# Patient Record
Sex: Male | Born: 1935 | Race: White | Hispanic: No | Marital: Married | Smoking: Former smoker
Health system: Southern US, Community
[De-identification: ages and names within clinical notes are randomized; demographics above are authoritative.]

## PROBLEM LIST (undated history)

## (undated) DIAGNOSIS — R1310 Dysphagia, unspecified: Secondary | ICD-10-CM

## (undated) DIAGNOSIS — N400 Enlarged prostate without lower urinary tract symptoms: Secondary | ICD-10-CM

## (undated) DIAGNOSIS — M5030 Other cervical disc degeneration, unspecified cervical region: Secondary | ICD-10-CM

## (undated) DIAGNOSIS — I214 Non-ST elevation (NSTEMI) myocardial infarction: Secondary | ICD-10-CM

## (undated) DIAGNOSIS — I10 Essential (primary) hypertension: Secondary | ICD-10-CM

## (undated) DIAGNOSIS — K5900 Constipation, unspecified: Secondary | ICD-10-CM

## (undated) DIAGNOSIS — F329 Major depressive disorder, single episode, unspecified: Secondary | ICD-10-CM

## (undated) DIAGNOSIS — G6181 Chronic inflammatory demyelinating polyneuritis: Secondary | ICD-10-CM

## (undated) DIAGNOSIS — G629 Polyneuropathy, unspecified: Secondary | ICD-10-CM

## (undated) DIAGNOSIS — R269 Unspecified abnormalities of gait and mobility: Secondary | ICD-10-CM

## (undated) DIAGNOSIS — N390 Urinary tract infection, site not specified: Secondary | ICD-10-CM

## (undated) HISTORY — DX: Urinary tract infection, site not specified: N39.0

## (undated) HISTORY — PX: HERNIA REPAIR: SHX51

---

## 2001-11-13 ENCOUNTER — Other Ambulatory Visit: Admission: RE | Admit: 2001-11-13 | Discharge: 2001-11-13 | Payer: Self-pay | Admitting: Dermatology

## 2001-12-16 ENCOUNTER — Ambulatory Visit (HOSPITAL_COMMUNITY): Admission: RE | Admit: 2001-12-16 | Discharge: 2001-12-16 | Payer: Self-pay | Admitting: Pulmonary Disease

## 2002-07-16 ENCOUNTER — Ambulatory Visit (HOSPITAL_COMMUNITY): Admission: RE | Admit: 2002-07-16 | Discharge: 2002-07-16 | Payer: Self-pay | Admitting: Internal Medicine

## 2002-07-17 ENCOUNTER — Ambulatory Visit (HOSPITAL_COMMUNITY): Admission: RE | Admit: 2002-07-17 | Discharge: 2002-07-17 | Payer: Self-pay | Admitting: Internal Medicine

## 2003-02-26 ENCOUNTER — Ambulatory Visit (HOSPITAL_COMMUNITY): Admission: RE | Admit: 2003-02-26 | Discharge: 2003-02-26 | Payer: Self-pay | Admitting: Pulmonary Disease

## 2004-02-24 ENCOUNTER — Ambulatory Visit (HOSPITAL_COMMUNITY): Admission: RE | Admit: 2004-02-24 | Discharge: 2004-02-24 | Payer: Self-pay | Admitting: Pulmonary Disease

## 2005-08-09 ENCOUNTER — Ambulatory Visit (HOSPITAL_COMMUNITY): Admission: RE | Admit: 2005-08-09 | Discharge: 2005-08-09 | Payer: Self-pay | Admitting: Internal Medicine

## 2005-08-09 ENCOUNTER — Ambulatory Visit: Payer: Self-pay | Admitting: Internal Medicine

## 2006-09-05 ENCOUNTER — Ambulatory Visit (HOSPITAL_COMMUNITY): Admission: RE | Admit: 2006-09-05 | Discharge: 2006-09-05 | Payer: Self-pay | Admitting: Pulmonary Disease

## 2010-01-27 ENCOUNTER — Ambulatory Visit (HOSPITAL_COMMUNITY): Admission: RE | Admit: 2010-01-27 | Discharge: 2010-01-27 | Payer: Self-pay | Admitting: Pulmonary Disease

## 2010-09-21 ENCOUNTER — Encounter (HOSPITAL_BASED_OUTPATIENT_CLINIC_OR_DEPARTMENT_OTHER): Payer: Medicare Other | Admitting: Internal Medicine

## 2010-09-21 ENCOUNTER — Ambulatory Visit (HOSPITAL_COMMUNITY)
Admission: RE | Admit: 2010-09-21 | Discharge: 2010-09-21 | Disposition: A | Discharge status: Home / Self Care | Payer: Medicare Other | Source: Ambulatory Visit | Attending: Internal Medicine | Admitting: Internal Medicine

## 2010-09-21 DIAGNOSIS — K5730 Diverticulosis of large intestine without perforation or abscess without bleeding: Secondary | ICD-10-CM | POA: Insufficient documentation

## 2010-09-21 DIAGNOSIS — Z860100 Personal history of colon polyps, unspecified: Secondary | ICD-10-CM | POA: Insufficient documentation

## 2010-09-21 DIAGNOSIS — Z1211 Encounter for screening for malignant neoplasm of colon: Secondary | ICD-10-CM

## 2010-09-21 DIAGNOSIS — K644 Residual hemorrhoidal skin tags: Secondary | ICD-10-CM | POA: Insufficient documentation

## 2010-09-21 DIAGNOSIS — I10 Essential (primary) hypertension: Secondary | ICD-10-CM | POA: Insufficient documentation

## 2010-09-21 DIAGNOSIS — Z09 Encounter for follow-up examination after completed treatment for conditions other than malignant neoplasm: Secondary | ICD-10-CM | POA: Insufficient documentation

## 2010-09-21 DIAGNOSIS — Z79899 Other long term (current) drug therapy: Secondary | ICD-10-CM | POA: Insufficient documentation

## 2010-10-08 NOTE — Op Note (Signed)
  NAME:  Dylan Nelson, Dylan Nelson               ACCOUNT NO.:  [id number]  MEDICAL RECORD NO.:  [id number]           PATIENT TYPE:  O  LOCATION:  DAYP                          FACILITY:  [facility]  PHYSICIAN:  Lionel December, M.D.    DATE OF BIRTH:  08/26/1933  DATE OF PROCEDURE:  09/21/2010 DATE OF DISCHARGE:                              OPERATIVE REPORT   PROCEDURE:  Colonoscopy.  INDICATIONS:  Dylan Nelson is 77 year old Caucasian male with history of colonic polyps whose last exam was 5 years ago.  He is presently not having any problems.  He is undergoing surveillance colonoscopy.  Procedures were reviewed with the patient and informed consent was obtained.  MEDS FOR CONSCIOUS SEDATION: 1. Demerol 25 mg IV. 2. Versed 4 mg IV.  FINDINGS:  Procedure performed in endoscopy suite.  The patient's vital signs and O2 sat were monitored during the procedure and remained stable.  The patient was placed in left lateral recumbent position and rectal examination performed.  No abnormality noted on external or digital exam.  Pentax videoscope was placed through rectum and advanced under vision into sigmoid colon and beyond.  Preparation was satisfactory.  He has scattered diverticula at sigmoid and descending colon.  Scope was passed into cecum which was identified by appendiceal orifice and ileocecal valve.  Pictures were taken for the record.  There was a small 6-7 mm lipoma at ascending colon which was left alone. Mucosa and rest of the colon was normal.  No polyps or masses were noted.  Rectal mucosa similarly was normal.  Scope was retroflexed to examine anorectal junction and small hemorrhoids noted below the dentate line.  Endoscope was withdrawn.  Withdrawal time was 12 minutes.  The patient tolerated the procedure well.  FINAL DIAGNOSES: 1. Examination performed to cecum. 2. No evidence of recurrent polyps. 3. Few scattered diverticula at sigmoid and descending colon and     external  hemorrhoids.  RECOMMENDATIONS: 1. High-fiber diet.  The patient will resume his usual meds as before. 2. We will not plan to schedule colonoscopy in 5 years, however, he     develops any symptoms pertaining to lower GI tract.  We may change     our plan.     Lionel December, M.D.     NR/MEDQ  D:  09/21/2010  T:  09/22/2010  Job:  [id number]  cc:   Dr. Juanetta Gosling  Electronically Signed by Lionel December M.D. on 10/08/2010 11:47:37 AM

## 2010-11-03 NOTE — Op Note (Signed)
NAME:  Dylan Nelson, Dylan Nelson NO.:  [id number]   MEDICAL RECORD NO.:  [id number]          PATIENT TYPE:  AMB   LOCATION:  DAY                           FACILITY:  [facility]   PHYSICIAN:  Lionel December, M.D.    DATE OF BIRTH:  April 24, 1934   DATE OF PROCEDURE:  08/09/2005  DATE OF DISCHARGE:                                 OPERATIVE REPORT   PROCEDURE:  Colonoscopy with polypectomy.   ENDOSCOPIST:  Lionel December, M.D.   INDICATIONS:  [name] is a 77 year old Caucasian male who is here for  surveillance colonoscopy.  He had his initial colonoscopy in January 2004  with removal of a large polyp from the rectum with high-grade dysplasia.  He  had an adenoma removed from the cecum and another tubulovillous adenoma  removed from his ascending colon.  He is now returning for followup exam.  He is presently free of GI symptoms.   The procedure and risks were reviewed with the patient and informed consent  was obtained.   MEDICINES FOR CONSCIOUS SEDATION:  Demerol 25 mg IV and Versed 4 mg IV.   FINDINGS:  Procedure performed in endoscopy suite.  The patient's vital  signs and O2 saturations were monitored during the procedure and remained  stable.  The patient was placed in the left lateral recumbent position and  rectal examination performed.  No abnormality noted on external or digital  exam.   Olympus videoscope was placed in the rectum and advanced under vision into  the sigmoid colon and beyond.  Preparation was satisfactory  There were a  few small diverticula at sigmoid colon.  The scope was passed into the cecum  which was identified by appendiceal orifice and ileocecal valve.  Pictures  were taken for the record.  There was a  small polyp just above the  ileocecal valve which was ablated by cold biopsy.  There was another 5-6 mm  sessile polyp at the hepatic flexure which was snared, and a third polyp at  the sigmoid colon which was small and simply coagulated using snare  tip.  The mucosa of the rest of the colon was normal. The rectal mucosa, similarly  was normal.   The scope was retroflexed to examine the anorectal junction which was  unremarkable.  Examined anorectal junction and small hemorrhoids were noted  below the dentate line.  The endoscope was straightened and withdrawn.  The  patient tolerated the procedure well.   FINAL DIAGNOSES:  1.  A 5-6 mm polyp snared from hepatic flexure and another one ablated by      cold biopsy by ascending colon.  Another tiny polyp at the sigmoid colon      was coagulated.  2.  A few tiny diverticula at the sigmoid colon and external hemorrhoids.   RECOMMENDATIONS:  Standard instructions given.  I will be contacting the  patient with biopsy results; and we will plan to bring him back 5 years from  now.      Lionel December, M.D.  Electronically Signed     NR/MEDQ  D:  08/09/2005  T:  08/09/2005  Job:  [id number]   cc:   Ramon Dredge L. Juanetta Gosling, M.D.  Fax: [phone]

## 2010-11-03 NOTE — Op Note (Signed)
NAME:  Dylan Nelson, Dylan Nelson                            ACCOUNT NO.:  [id number]   MEDICAL RECORD NO.:  [id number]                   PATIENT TYPE:  AMB   LOCATION:  DAY                                  FACILITY:  [facility]   PHYSICIAN:  Lionel December, M.D.                 DATE OF BIRTH:  06-26-1933   DATE OF PROCEDURE:  07/16/2002  DATE OF DISCHARGE:                                 OPERATIVE REPORT   PROCEDURE:  Total colonoscopy with polypectomy.   ENDOSCOPIST:  Lionel December, M.D.   INDICATIONS:  This patient is a 77 year old Caucasian male with recent onset  of hematochezia.  His family history is negative for colorectal carcinoma.  The procedures and risks were reviewed with the patient and informed consent  was obtained.   PREOPERATIVE MEDICATIONS:  Demerol 25 mg IV and Versed 4 mg IV in divided  doses.   INSTRUMENT:  Olympus video system.   FINDINGS:  Procedure performed in endoscopy suite.  The patient's vital  signs and O2 saturation were monitored during the procedure and remained  stable.  The patient was placed in the left lateral recumbent position and  rectal examination was performed.  No abnormality noted on external or  digital exam.   The scope was placed in the rectum and advanced into the rectosigmoid  junction where he had a large polyp at least 4 cm in maximal diameter.  This  was snared on the way out as described below.  The scope was passed to the  sigmoid colon and beyond.  Preparation was satisfactory.  A few scattered  diverticula were noted at the sigmoid colon.  The scope was passed to the  cecum which was identified by appendiceal orifice and the ileocecal valve.  There was a small polyp to the right of the appendiceal orifice which was  snared and retrieved for histologic examination.  There was another 8-to-9-  mm polyp at the ascending colon which was snared and retrieved for  histologic examination. There was another small polyp in this area which was  simply coagulated using snare tip.  The polyp at the rectosigmoid junction  was on a short stalk.  It was hard to see it because of the size of the  polyp. This polyp was snared piecemeal.  The polypectomy site was clean  without any bleeding.  This polyp was completely excised.  All of the pieces  were taken out for histology.  The rectal mucosa was normal.  The scope was  retroflexed to exam the anorectal junction and small hemorrhoids were noted  below the dentate line.   The endoscope was straightened and withdrawn.  The patient tolerated the  procedure well.   FINAL DIAGNOSES:  1. Large polyp snared from the rectosigmoid junction in piecemeal fashion.     Polypectomy is complete.  2. Three polyps treated at the right colon.  One  was at cecum, another one     in the ascending colon.  Two were snared and one was coagulated.  3. Scattered diverticula at the sigmoid colon.  4. Small external hemorrhoids.   DISCUSSION:  It would appear that his hematochezia was secondary to this  large polyp.   RECOMMENDATIONS:  Standard instructions given.  I will contact the patient  with biopsy results and further recommendations.                                               Lionel December, M.D.    NR/MEDQ  D:  07/16/2002  T:  07/16/2002  Job:  [id number]   cc:   Ramon Dredge L. Juanetta Gosling, M.D.  [address]  [place]  [place] [address]  Fax: [phone]

## 2010-11-03 NOTE — Op Note (Signed)
NAME:  Dylan Nelson, Dylan Nelson                            ACCOUNT NO.:  [id number]   MEDICAL RECORD NO.:  [id number]                   PATIENT TYPE:  AMB   LOCATION:  DAY                                  FACILITY:  [facility]   PHYSICIAN:  Lionel December, M.D.                 DATE OF BIRTH:  [birth year]   DATE OF PROCEDURE:  07/17/2002  DATE OF DISCHARGE:                                 OPERATIVE REPORT   PROCEDURE:  Flexible sigmoidoscopy with injection/coagulation of bleeding  polypectomy site.   INDICATIONS FOR PROCEDURE:  The patient is a 77 year old Caucasian male who  had a colonoscopy yesterday. He had three polyps removed from his right  colon and a very large sessile polyp removed from the rectosigmoid junction.  This was removed piecemeal. The polypectomy was felt to be complete. The  patient had some bleeding last night and again this morning. He was  therefore advised to come back to the hospital. He feels fine, otherwise, he  is not dizzy or light headed. He does not have any abdominal pain. Digital  exam revealed a scant amount of maroonish stool on the gloved finger. I  advised the patient to have therapeutic sigmoidoscopy and he is agreeable.   PREOP MEDICATIONS:  None.   INSTRUMENT:  Olympus video system.   FINDINGS:  Procedure performed in endoscopy suite. The patient was placed in  the left lateral decubitus position and a rectal examination performed.  There was a small amount of fresh maroonish blood. The scope was placed in  the rectum. There was some blood which was suctioned out through the  channel. The polypectomy site was the rectosigmoid junction. It had some  fresh blood or clots on it but there was no active bleeding. Quick exam of  sigmoid colon was normal. The polypectomy site was injected with dilute,  i.e.,  1:10,000 epinephrine. There were three sites which had fresh specks  of blood. One site was pulsating and felt to be a small blood vessel. After  local  injection therapy, these three sites were coagulated using a Gold  probe. These sites were covered in two black eschars, there was no bleeding  noted during or after coagulation. The endoscope was withdrawn. The patient  tolerated the procedure well and he was discharged to home.   FINAL DIAGNOSIS:  Post polypectomy bleed. Bleeding sites treated with  combination of injection and coagulation therapy.    RECOMMENDATIONS:  1. The patient was advised to rest at home for 24 hours.  2. He will continue to abstain from taking ASA for 10 days.  3. I will be contacting the patient next week with the biopsy results which     are still pending.  Lionel December, M.D.    NR/MEDQ  D:  07/17/2002  T:  07/17/2002  Job:  [id number]   cc:   Ramon Dredge L. Juanetta Gosling, M.D.  [address]  [place]  [place] [address]  Fax: [phone]

## 2010-11-28 ENCOUNTER — Ambulatory Visit (INDEPENDENT_AMBULATORY_CARE_PROVIDER_SITE_OTHER): Payer: Medicare Other | Admitting: Urology

## 2010-11-28 DIAGNOSIS — C61 Malignant neoplasm of prostate: Secondary | ICD-10-CM

## 2010-11-28 DIAGNOSIS — N400 Enlarged prostate without lower urinary tract symptoms: Secondary | ICD-10-CM

## 2011-05-12 ENCOUNTER — Emergency Department (HOSPITAL_COMMUNITY): Payer: Medicare Other

## 2011-05-12 ENCOUNTER — Emergency Department (HOSPITAL_COMMUNITY)
Admission: EM | Admit: 2011-05-12 | Discharge: 2011-05-12 | Disposition: A | Discharge status: Home / Self Care | Payer: Medicare Other | Attending: Emergency Medicine | Admitting: Emergency Medicine

## 2011-05-12 DIAGNOSIS — X500XXA Overexertion from strenuous movement or load, initial encounter: Secondary | ICD-10-CM | POA: Insufficient documentation

## 2011-05-12 DIAGNOSIS — G5790 Unspecified mononeuropathy of unspecified lower limb: Secondary | ICD-10-CM | POA: Insufficient documentation

## 2011-05-12 DIAGNOSIS — R1030 Lower abdominal pain, unspecified: Secondary | ICD-10-CM

## 2011-05-12 DIAGNOSIS — R109 Unspecified abdominal pain: Secondary | ICD-10-CM | POA: Insufficient documentation

## 2011-05-12 DIAGNOSIS — I10 Essential (primary) hypertension: Secondary | ICD-10-CM | POA: Insufficient documentation

## 2011-05-12 HISTORY — DX: Polyneuropathy, unspecified: G62.9

## 2011-05-12 HISTORY — DX: Essential (primary) hypertension: I10

## 2011-05-12 HISTORY — DX: Benign prostatic hyperplasia without lower urinary tract symptoms: N40.0

## 2011-05-12 NOTE — ED Provider Notes (Addendum)
History  This chart was scribed for [employer]. Rubin Payor, MD by Bennett Scrape. This patient was seen in room [facility]19/[facility] and the patient's care was started at 7:17PM.  CSN: [id number] Arrival date & time: 05/12/2011  6:39 PM   First MD Initiated Contact with Patient 05/12/11 1910      Chief Complaint  Patient presents with  . Groin Pain    The history is provided by the patient. No language interpreter was used.   Dylan Nelson is a 77 y.o. male who presents to the Emergency Department complaining of one day of constant left inguinal area pain with associated constant numbness and swelling after pt felt something pop while pulling himself up off of the comode with an wall assistance bar. Pt states that he can't rotate his left leg inward or put weight on his left leg. Pt had similar symptoms one week earlier and was diagnosed with inflammatory neuropathy. He was prescribed antibiotics and steroids with mild improvement in symptoms. Pt states that symptoms and rotation have improved since arriving to ED. Pt states that sitting improves his symptoms and that movement aggravates his pain.    Past Medical History  Diagnosis Date  . Hypertension   . Neuropathy   . Prostate enlargement     History reviewed. No pertinent past surgical history.  History reviewed. No pertinent family history.  History  Substance Use Topics  . Smoking status: Not on file  . Smokeless tobacco: Not on file  . Alcohol Use:      Review of Systems A complete 10 system review of systems was obtained and is otherwise negative except as noted in the HPI.   Allergies  Review of patient's allergies indicates not on file.  Home Medications  No current outpatient prescriptions on file.  BP 123/74  Pulse 97  Temp(Src) 97.6 F (36.4 C) (Oral)  Resp 20  Ht 6\' 1"  (1.854 m)  Wt 245 lb (111.131 kg)  BMI 32.32 kg/m2  SpO2 94%  Physical Exam  Nursing note and vitals reviewed. Constitutional: He is  oriented to person, place, and time. He appears well-developed and well-nourished.  HENT:  Head: Normocephalic and atraumatic.  Eyes: EOM are normal. Pupils are equal, round, and reactive to light.  Neck: Neck supple. No tracheal deviation present.  Cardiovascular: Normal rate and regular rhythm.   Pulmonary/Chest: Effort normal and breath sounds normal. No respiratory distress.  Abdominal: Soft. There is no tenderness.  Musculoskeletal:       Mild tenderness on the left lateral pubis, decreased sensation in left calf, decreased strength in the left calf, decreased ROM in left calf which is chronic, mild tenderness with left leg rotation inward  Neurological: He is alert and oriented to person, place, and time.  Skin: Skin is warm and dry.  Psychiatric: He has a normal mood and affect. His behavior is normal.    ED Course  Procedures (including critical care time)  DIAGNOSTIC STUDIES: Oxygen Saturation is 94% on room air, adequate by my interpretation.    COORDINATION OF CARE: 7:22PM-Discussed treatment plan with patient at bedside and patient agreed to plan. 8:11PM-Discussed discharge home with patient and patient agreed to plan.   Labs Reviewed - No data to display Dg Pelvis 1-2 Views  05/12/2011  *RADIOLOGY REPORT*  Clinical Data: Felt pop left groin region after lifting himself.  PELVIS - 1-2 VIEW  Comparison: None.  Findings: Limited by patient's habitus.  No gross osseous abnormality noted.  IMPRESSION: Limited  by patient's habitus.  No gross osseous abnormality noted.  Original Report Authenticated By: Fuller Canada, M.D.     1. Groin pain       MDM  Patient felt a pop in his left hip he was getting off the commode. He is a history of neuropathy in that leg. He states that certain movements that make the pain worse. The pain is improving now. He's not very ambulatory at baseline. He is a wheelchair for the house a motorized scooter for outside. His x-ray does not show  a fracture. His pain is improving. It may have been a tendon problem, less likely I feel is a subluxation resolved. He should followup with his Dr. he is not appear to have a hernia or any testicular tenderness or swelling.      I personally performed the services described in this documentation, which was scribed in my presence. The recorded information has been reviewed and considered.   Juliet Rude. Rubin Payor, MD 05/12/11 2013  Juliet Rude. Rubin Payor, MD 05/12/11 2014

## 2011-05-12 NOTE — ED Notes (Signed)
Pt states he was pulling himself up from the commode and felt something pop in the left groin area

## 2011-05-29 ENCOUNTER — Ambulatory Visit (INDEPENDENT_AMBULATORY_CARE_PROVIDER_SITE_OTHER): Payer: Medicare Other | Admitting: Urology

## 2011-05-29 DIAGNOSIS — N400 Enlarged prostate without lower urinary tract symptoms: Secondary | ICD-10-CM

## 2011-05-29 DIAGNOSIS — C61 Malignant neoplasm of prostate: Secondary | ICD-10-CM

## 2011-12-04 ENCOUNTER — Other Ambulatory Visit: Payer: Self-pay | Admitting: Urology

## 2011-12-04 ENCOUNTER — Ambulatory Visit (HOSPITAL_COMMUNITY)
Admission: RE | Admit: 2011-12-04 | Discharge: 2011-12-04 | Disposition: A | Discharge status: Home / Self Care | Payer: Medicare Other | Source: Ambulatory Visit | Attending: Urology | Admitting: Urology

## 2011-12-04 VITALS — BP 132/68 | Resp 18

## 2011-12-04 DIAGNOSIS — C61 Malignant neoplasm of prostate: Secondary | ICD-10-CM | POA: Insufficient documentation

## 2011-12-04 NOTE — Discharge Instructions (Signed)
Prostate Biopsy  TRUS Biopsy  BEFORE THE TEST    Do not take aspirin. Do not take any medicine that has aspirin in it 7 days before your biopsy.    You may be given a medicine to take on the day of your biopsy.    You may also be given a medicine or treatment to help you go poop (laxative or enema).   AFTER THE TEST   Only take medicine as told by your doctor.    It is normal to have some bleeding from your rectum for the first 5 days.    You may have blood in your pee (urine) or sperm.   Finding out the results of your test  Ask when your test results will be ready. Make sure you get your test results.  GET HELP RIGHT AWAY IF:   You have a temperature by mouth above 102 F (38.9 C), not controlled by medicine.    You have blood in your pee for more than 5 days.    You have a lot of blood in your pee.    You have bleeding from your rectum for more than 5 days or have a lot of blood in your poop (feces).    You have severe pain.   Document Released: 05/23/2009 Document Revised: 05/24/2011 Document Reviewed: 05/23/2009  [employer] Patient Information 2012 [employer].

## 2011-12-04 NOTE — Progress Notes (Signed)
Twelve specimens obtained

## 2011-12-10 ENCOUNTER — Encounter: Payer: Self-pay | Admitting: Urology

## 2012-05-20 ENCOUNTER — Ambulatory Visit (INDEPENDENT_AMBULATORY_CARE_PROVIDER_SITE_OTHER): Payer: Medicare Other | Admitting: Urology

## 2012-05-20 DIAGNOSIS — N400 Enlarged prostate without lower urinary tract symptoms: Secondary | ICD-10-CM

## 2012-05-20 DIAGNOSIS — C61 Malignant neoplasm of prostate: Secondary | ICD-10-CM

## 2012-11-18 ENCOUNTER — Ambulatory Visit (INDEPENDENT_AMBULATORY_CARE_PROVIDER_SITE_OTHER): Payer: Medicare Other | Admitting: Urology

## 2012-11-18 DIAGNOSIS — N400 Enlarged prostate without lower urinary tract symptoms: Secondary | ICD-10-CM

## 2012-11-18 DIAGNOSIS — C61 Malignant neoplasm of prostate: Secondary | ICD-10-CM

## 2013-01-14 ENCOUNTER — Other Ambulatory Visit (HOSPITAL_COMMUNITY): Payer: Self-pay | Admitting: Pulmonary Disease

## 2013-01-14 ENCOUNTER — Ambulatory Visit (HOSPITAL_COMMUNITY)
Admission: RE | Admit: 2013-01-14 | Discharge: 2013-01-14 | Disposition: A | Discharge status: Home / Self Care | Payer: Medicare Other | Source: Ambulatory Visit | Attending: Pulmonary Disease | Admitting: Pulmonary Disease

## 2013-01-14 DIAGNOSIS — R220 Localized swelling, mass and lump, head: Secondary | ICD-10-CM

## 2013-01-14 DIAGNOSIS — Q670 Congenital facial asymmetry: Secondary | ICD-10-CM

## 2013-01-14 DIAGNOSIS — G319 Degenerative disease of nervous system, unspecified: Secondary | ICD-10-CM | POA: Insufficient documentation

## 2013-01-14 DIAGNOSIS — R29898 Other symptoms and signs involving the musculoskeletal system: Secondary | ICD-10-CM | POA: Insufficient documentation

## 2013-05-26 ENCOUNTER — Ambulatory Visit (INDEPENDENT_AMBULATORY_CARE_PROVIDER_SITE_OTHER): Payer: Medicare Other | Admitting: Urology

## 2013-05-26 ENCOUNTER — Encounter (INDEPENDENT_AMBULATORY_CARE_PROVIDER_SITE_OTHER): Payer: Self-pay

## 2013-05-26 DIAGNOSIS — C61 Malignant neoplasm of prostate: Secondary | ICD-10-CM

## 2013-05-26 DIAGNOSIS — N400 Enlarged prostate without lower urinary tract symptoms: Secondary | ICD-10-CM

## 2013-07-13 ENCOUNTER — Other Ambulatory Visit (HOSPITAL_COMMUNITY): Payer: Self-pay | Admitting: Pulmonary Disease

## 2013-07-13 ENCOUNTER — Ambulatory Visit (HOSPITAL_COMMUNITY)
Admission: RE | Admit: 2013-07-13 | Discharge: 2013-07-13 | Disposition: A | Discharge status: Home / Self Care | Payer: Medicare Other | Source: Ambulatory Visit | Attending: Pulmonary Disease | Admitting: Pulmonary Disease

## 2013-07-13 DIAGNOSIS — R209 Unspecified disturbances of skin sensation: Secondary | ICD-10-CM | POA: Insufficient documentation

## 2013-07-13 DIAGNOSIS — R29898 Other symptoms and signs involving the musculoskeletal system: Secondary | ICD-10-CM

## 2013-07-13 DIAGNOSIS — M47812 Spondylosis without myelopathy or radiculopathy, cervical region: Secondary | ICD-10-CM | POA: Insufficient documentation

## 2013-09-02 ENCOUNTER — Other Ambulatory Visit: Payer: Self-pay | Admitting: Neurology

## 2013-09-02 DIAGNOSIS — G039 Meningitis, unspecified: Secondary | ICD-10-CM

## 2013-09-07 ENCOUNTER — Ambulatory Visit (HOSPITAL_COMMUNITY)
Admission: RE | Admit: 2013-09-07 | Discharge: 2013-09-07 | Disposition: A | Discharge status: Home / Self Care | Payer: Medicare Other | Source: Ambulatory Visit | Attending: Neurology | Admitting: Neurology

## 2013-09-07 DIAGNOSIS — M6281 Muscle weakness (generalized): Secondary | ICD-10-CM | POA: Insufficient documentation

## 2013-09-07 DIAGNOSIS — G5790 Unspecified mononeuropathy of unspecified lower limb: Secondary | ICD-10-CM | POA: Insufficient documentation

## 2013-09-07 DIAGNOSIS — IMO0001 Reserved for inherently not codable concepts without codable children: Secondary | ICD-10-CM | POA: Insufficient documentation

## 2013-09-07 DIAGNOSIS — R262 Difficulty in walking, not elsewhere classified: Secondary | ICD-10-CM | POA: Insufficient documentation

## 2013-09-07 DIAGNOSIS — R269 Unspecified abnormalities of gait and mobility: Secondary | ICD-10-CM | POA: Insufficient documentation

## 2013-09-07 NOTE — Evaluation (Signed)
Physical Therapy Evaluation  Patient Details  Name: Dylan Nelson MRN: [id number]  Date of Birth: September 18, 1933  Today's Date: 09/07/2013 Time: 1345-1430 PT Time Calculation (min): 45 min   Charges PT evaluation            Visit#: 1 of 8  Re-eval: 10/07/13    Authorization: [employer]/ [employer]     Authorization Time Period:    Authorization Visit#: 1 of 8   Past Medical History:  Past Medical History  Diagnosis Date  . Hypertension   . Neuropathy   . Prostate enlargement    Past Surgical History: No past surgical history on file.  Subjective Symptoms/Limitations Symptoms:  increased amount of weakness over past 3-4 months, states years ago 10+  leg weakness started, presents using scooter, states uses  electric wheel chair in house, has not walked inside home for  10 years  Pertinent History: 79 year old male , lives with wife , house adapted, sliding transfers without sliding board , unknown cause of msucle weakness  Limitations: Standing;Walking;House hold activities How long can you walk comfortably?: unable to walk or stand x 10 + years  Patient Stated Goals: improve strength, transfer better  Pain Assessment Currently in Pain?: No/denies  Precautions/Restrictions  Precautions Precaution Comments: numbness B feet   Balance Screening Balance Screen Has the patient fallen in the past 6 months: No (recalls incidents of falls with sliding body from chair to chair ) Has the patient had a decrease in activity level because of a fear of falling? : No Is the patient reluctant to leave their home because of a fear of falling? : No  Prior Functioning  Prior Function Level of Independence: Needs assistance with ADLs;Independent with homemaking with wheelchair Driving: Yes Vocation: Retired  RLE AROM (degrees) Right Ankle Dorsiflexion: 0 Right Ankle Plantar Flexion: 0 RLE Strength Right Hip Flexion: 3/5 Right Hip ABduction: 2-/5 Right Hip ADduction: 3/5 Right Knee Flexion:  3/5 Right Knee Extension: 3/5 Right Ankle Dorsiflexion: 0/5 Right Ankle Plantar Flexion: 3/5 RLE Tone RLE Tone: Hypotonic LLE AROM (degrees) Left Ankle Dorsiflexion: 0 Left Ankle Plantar Flexion: 0 LLE Strength Left Hip Flexion: 3/5 Left Hip ABduction: 2-/5 Left Knee Flexion: 3/5 Left Knee Extension: 3/5 Left Ankle Dorsiflexion: 0/5 Left Ankle Plantar Flexion: 0/5 LLE Tone LLE Tone: Hypotonic  Exercise/Treatments Mobility/Balance  Bed Mobility Bed Mobility: Sit to Supine Sit to Supine: 7: Independent Transfers Transfers: Lateral/Scoot Transfers Lateral/Scoot Transfers: 6: Modified independent (Device/Increase time) Instructed in slide board transfer for treatment table to scooter chair  Ambulation/Gait Ambulation/Gait: No Sit to stand mod A x 2, forward flexed posture at hips, 30 sec tolerance  Wheelchair Mobility Wheelchair Mobility: Yes (motorized scooter )       Physical Therapy Assessment and Plan PT Assessment and Plan Clinical Impression Statement: 80 year old pleasant male present to PT for evaluation and treatment with med dx of peripheral neuropathy and ataxic gait. Script alos contains orders for orthotics. He states muscle weakness present and worsening since 2001, causing inability to ambulate and stand insdoe his home over past 10 years. He does utilize a Estate manager/land agent outsdie the home and elecctric wheelchair inside the home.  Pt will benefit from skilled therapeutic intervention in order to improve on the following deficits: Decreased mobility;Decreased strength;Decreased activity tolerance Rehab Potential: Fair Clinical Impairments Affecting Rehab Potential: motivated to improve tranfsers using sliding board, rehab potential foar to good to reach goals, poor prognosis for standing tolerance   PT Frequency: Min 2X/week  PT Duration: 4 weeks PT Treatment/Interventions: DME instruction;Functional mobility training;Therapeutic activities;Therapeutic  exercise;Patient/family education PT Plan: re educate on safe use of sliding board, sitting and table based leg strengthening exercises. trials of standing as toerlated per knee pain     Goals Home Exercise Program Pt/caregiver will Perform Home Exercise Program: For increased strengthening PT Goal: Perform Home Exercise Program - Progress: Goal set today PT Short Term Goals Time to Complete Short Term Goals: 4 weeks PT Short Term Goal 1: safely and independently use transfer sliding board in clinic from scooter chair to treatment table to allow for greater ease with transfers at home from scooter to wheelchair and other surfaces  PT Short Term Goal 2: patient MMT seated hip flexion 4/5 for transfers and leg management in bed  PT Short Term Goal 3: patient  to dispaly weight bearing and pushing through legs during sliding tranfers for improved safety    Problem List Patient Active Problem List   Diagnosis Date Noted  . Muscle weakness (generalized) 09/07/2013  . Unable to walk 09/07/2013    PT - End of Session Equipment Utilized During Treatment: Gait belt;Other (comment) (slide board ) General Behavior During Therapy: WFL for tasks assessed/performed PT Plan of Care Consulted and Agree with Plan of Care: Patient  GP Functional Assessment Tool Used: FOTO  Functional Limitation: Changing and maintaining body position Changing and Maintaining Body Position Current Status (C1448): At least 60 percent but less than 80 percent impaired, limited or restricted Changing and Maintaining Body Position Goal Status (J8563): At least 40 percent but less than 60 percent impaired, limited or restricted  Dylan Nelson 09/07/2013, 5:07 PM  Physician Documentation Your signature is required to indicate approval of the treatment plan as stated above.  Please sign and either send electronically or make a copy of this report for your files and return this physician signed original.   Please mark one  1.__approve of plan  2. ___approve of plan with the following conditions.   ______________________________                                                          _____________________ Physician Signature                                                                                                             Date

## 2013-09-10 ENCOUNTER — Ambulatory Visit (HOSPITAL_COMMUNITY)
Admission: RE | Admit: 2013-09-10 | Discharge: 2013-09-10 | Disposition: A | Discharge status: Home / Self Care | Payer: Medicare Other | Source: Ambulatory Visit | Attending: Neurology | Admitting: Neurology

## 2013-09-10 NOTE — Progress Notes (Signed)
Physical Therapy Treatment Patient Details  Name: Dylan Nelson MRN: [id number] Date of Birth: September 06, 1933  Today's Date: 09/10/2013 Time: 1345-1430 PT Time Calculation (min): 45 min Charges: Therex x 40  Visit#: 2 of 8  Re-eval: 10/07/13  Authorization: Medicare/ BCBS   Authorization Visit#: 2 of 8   Subjective: Symptoms/Limitations Symptoms: "When I try to stand my feet slide." Pain Assessment Currently in Pain?: No/denies  Exercise/Treatments Standing Other Standing Lumbar Exercises: Sit to stadn from elevated surface x 3 with 2 assist Supine Ab Set: 10 reps Clam: 10 reps Bent Knee Raise: 10 reps Bridge: 10 reps Straight Leg Raise: 5 reps  Physical Therapy Assessment and Plan PT Assessment and Plan Clinical Impression Statement: Therapist facilitated therapeutic exercises to improve LE and core strength to in turn improve functional strength/independence. Pt able to stand from elevated surface maximum of 20" with walker and 2 assist. Pt has no ankle muscle activation and feet tend to slide or pronate when standing. Pt may benefit from AFO to improve ankle alignment in standing. Pt presents with flexed trunk when standing and bears most of his weight through his right upper extremity. Pt requires multimodal cueing to improve LE control and coordination.  Pt will benefit from skilled therapeutic intervention in order to improve on the following deficits: Decreased mobility;Decreased strength;Decreased activity tolerance Rehab Potential: Fair PT Treatment/Interventions: DME instruction;Functional mobility training;Therapeutic activities;Therapeutic exercise;Patient/family education PT Plan: Continue to progress safe use of sliding board, sitting and table based leg strengthening exercises. Assess need for AFO for foot alignment in standing.    Problem List Patient Active Problem List   Diagnosis Date Noted  . Muscle weakness (generalized) 09/07/2013  . Unable to walk  09/07/2013    PT - End of Session Equipment Utilized During Treatment: Gait belt;Other (comment) (slide board ) Activity Tolerance: Patient tolerated treatment well General Behavior During Therapy: [facility] for tasks assessed/performed  GP Functional Assessment Tool Used: Bedelia Person, PTA  09/10/2013, 4:46 PM

## 2013-09-14 ENCOUNTER — Ambulatory Visit (HOSPITAL_COMMUNITY)
Admission: RE | Admit: 2013-09-14 | Discharge: 2013-09-14 | Disposition: A | Discharge status: Home / Self Care | Payer: Medicare Other | Source: Ambulatory Visit | Attending: Neurology | Admitting: Neurology

## 2013-09-14 DIAGNOSIS — M6281 Muscle weakness (generalized): Secondary | ICD-10-CM

## 2013-09-14 DIAGNOSIS — R262 Difficulty in walking, not elsewhere classified: Secondary | ICD-10-CM

## 2013-09-14 NOTE — Progress Notes (Signed)
Physical Therapy Treatment Patient Details  Name: Dylan Nelson MRN: [id number] Date of Birth: Sep 05, 1933  Today's Date: 09/14/2013 Time: 1315-1405 PT Time Calculation (min): 50 min 1315 - 1400 TE  Visit#: 3 of 8  Re-eval: 10/07/13 Assessment Diagnosis: ataxic gait, peripheral neuropathy  Next MD Visit: Phillips Odor MD  Prior Therapy: no   Authorization: Medicare/ BCBS   Authorization Time Period:    Authorization Visit#: 3 of 8   Subjective: Symptoms/Limitations Symptoms: no new complaints, no pain  Pertinent History: 80 year old male , lives with wife , house adapted, sliding transfers without sliding board , unknown cause of msucle weakness   Precautions/Restrictions     Exercise/Treatments Mobility/Balance        Stretches Lower Trunk Rotation: 5 reps;Limitations Lower Trunk Rotation Limitations: therapist resistance  Aerobic   Machines for Strengthening   Standing   Seated Other Seated Lumbar Exercises: seated LAQ 10x, seated hip flexion 10x, seated forward and side weight shifting Other Seated Lumbar Exercises: wheelchair leg mobility reverse leg pushes 60 feet, forward leg pulls/hamstrings 35 feet  Supine Ab Set: 10 reps Clam: 10 reps Bent Knee Raise: 10 reps Bridge: 10 reps Straight Leg Raise: 5 reps Other Supine Lumbar Exercises: seated ball squeeze in hoooklying 10x 5 sec holds  Other Supine Lumbar Exercises: hs curls with heel on green ball 15x  Sidelying   Prone    Quadruped       Physical Therapy Assessment and Plan PT Assessment and Plan Clinical Impression Statement: therapsit faciliated exercises for core and leg strength, verbal and tactile cues for weight shifting to use transfer board , mod assist from wheelchair to tratemnt table using slide board, min assist to supervision treatment table back to transport wheelchair, improved core strength would help with weight shifting for improved placement of sliding board  PT Plan: Continue  to progress safe use of sliding board, sitting and table based leg strengthening exercises. PTA contacted AFO and awaiting for return call     Goals PT Short Term Goals Time to Complete Short Term Goals: 4 weeks PT Short Term Goal 1: safely and independently use transfer sliding board in clinic from scooter chair to treatment table to allow for greater ease with transfers at home from scooter to wheelchair and other surfaces  PT Short Term Goal 1 - Progress: Progressing toward goal PT Short Term Goal 2: patient MMT seated hip flexion 4/5 for transfers and leg management in bed  PT Short Term Goal 2 - Progress: Progressing toward goal PT Short Term Goal 3: patient  to dispaly weight bearing and pushing through legs during sliding tranfers for improved safety   PT Short Term Goal 3 - Progress: Progressing toward goal  Problem List Patient Active Problem List   Diagnosis Date Noted  . Muscle weakness (generalized) 09/07/2013  . Unable to walk 09/07/2013       GP    Arrian Manson 09/14/2013, 2:36 PM

## 2013-09-16 ENCOUNTER — Encounter (HOSPITAL_COMMUNITY): Payer: Self-pay

## 2013-09-16 ENCOUNTER — Ambulatory Visit (HOSPITAL_COMMUNITY)
Admission: RE | Admit: 2013-09-16 | Discharge: 2013-09-16 | Disposition: A | Discharge status: Home / Self Care | Payer: Medicare Other | Source: Ambulatory Visit | Attending: Neurology | Admitting: Neurology

## 2013-09-16 ENCOUNTER — Inpatient Hospital Stay (HOSPITAL_COMMUNITY)
Admission: RE | Admit: 2013-09-16 | Discharge: 2013-09-16 | Disposition: A | Discharge status: Home / Self Care | Payer: Medicare Other | Source: Ambulatory Visit

## 2013-09-16 VITALS — BP 148/91 | HR 82 | Temp 97.1°F | Resp 18

## 2013-09-16 DIAGNOSIS — G039 Meningitis, unspecified: Secondary | ICD-10-CM | POA: Insufficient documentation

## 2013-09-16 LAB — CSF CELL COUNT WITH DIFFERENTIAL
RBC Count, CSF: 171 /mm3 — ABNORMAL HIGH
TUBE #: 4
WBC, CSF: 1 /mm3 (ref 0–5)

## 2013-09-16 LAB — CRYPTOCOCCAL ANTIGEN, CSF: CRYPTO AG: NEGATIVE

## 2013-09-16 LAB — PROTEIN, CSF: TOTAL PROTEIN, CSF: 37 mg/dL (ref 15–45)

## 2013-09-16 LAB — GLUCOSE, CSF: GLUCOSE CSF: 65 mg/dL (ref 43–76)

## 2013-09-16 NOTE — Progress Notes (Signed)
Pt received to Phase 2 at 1130.  Pt remained flat for 4 hours.  Taking po fluids well.  Denies any headaches.  Discharged from Phase 2 at 1530. Discharge instructions given and patient and family verbalized understanding.

## 2013-09-16 NOTE — Discharge Instructions (Signed)
Spinal Headache, Conservative Treatment Sometimes following a spinal tap (lumbar puncture) or an epidural, there may be CSF (cerebrospinal fluid) leakage through a hole in the dura. The dura is one of the protective membranes covering the brain and spinal cord. This leakage produces low CSF pressure. Pressure on the pain sensitive structures in the brain and relaxation (dilating) of the vessels in the head when the patient is upright is thought to cause headaches. The headache usually lasts until the hole heals and CSF pressure is restored. This can last a few days and rarely more than one week. THERAPEUTIC ALTERNATIVES TO EPIDURAL BLOOD PATCHING INCLUDE:  Bedrest: The symptoms of PDPH are lessened by lying down on your back. Lying on your back for a period of time (such as 24 hours) after a dural puncture has no preventative effect. It only delays the start of the PDPH.  Hydration: Normal taking in fluids (hydration) should be maintained. Extra hydration does not alleviate the headache, but dehydration may make symptoms worse.  Analgesics: Narcotic analgesics and, in some instances, non-steroidal anti-inflammatory agents are often given for treatment of the headache pain.  Caffeine: Caffeine intake is a therapy to help shrink the cerebral vessels. Patients should have caffeine early in the day so that he/she can sleep at night. 500 mg of Caffeine sodium benzoate can be given in the vein (intravenously). It can be given once two hours later if the first dose does not have the desired effect. Caffeinated beverages (colas, tea, coffee) can be somewhat effective also.  Epidural Saline Injection: Large-volume shots (boluses) or infusion of epidural normal saline can help to quickly and temporarily increase the epidural pressure. The infusions slow the speed at which CSF leaks through the dural hole. This may speed the natural healing process. Although epidural saline can be a useful technique, epidural blood  patches often have a higher success rate. SEEK IMMEDIATE MEDICAL ATTENTION IF:   You do not get relief from the medications given to you or your pain becomes severe.  You have an unexplained oral temperature above 102 F (38.9 C), or as your caregiver suggests.  You have a stiff neck.  You lose bowel or bladder control.  You develop severe symptoms different from your first symptoms.  You have trouble walking. Document Released: 11/24/2001 Document Revised: 08/27/2011 Document Reviewed: 12/25/2012 [facility] Patient Information 2014 [place], [place]. Lumbar Puncture A lumbar puncture, or spinal tap, is a procedure in which a small amount of the fluid that surrounds the brain and spinal cord is removed and examined. The fluid is called the cerebrospinal fluid. This procedure may be done to:   Help diagnose various problems, such as meningitis, encephalitis, multiple sclerosis, and AIDS.   Remove fluid and relieve pressure that occurs with certain types of headaches.   Look for bleeding within the brain and spinal cord areas (central nervous system).   Place medicine into the spinal fluid.  LET [facility] CARE PROVIDER KNOW ABOUT:  Any allergies you have.  All medicines you are taking, including vitamins, herbs, eye drops, creams, and over-the-counter medicines.  Previous problems you or members of your family have had with the use of anesthetics.  Any blood disorders you have.  Previous surgeries you have had.  Medical conditions you have. RISKS AND COMPLICATIONS Generally, this is a safe procedure. However, as with any procedure, complications can occur. Possible complications include:   Spinal headache. This is a severe headache that occurs when there is a leak of spinal fluid.  A spinal headache causes discomfort but is not dangerous. If it persists, another procedure may be done to treat the headache.  Bleeding. This most often occurs in people with bleeding  disorders. These are disorders in which the blood does not clot normally.   Infection at the insertion site that can spread to the bone or spinal fluid.  Formation of a spinal cord tumor (rare).  Brain herniation or movement of the brain into the spinal cord (rare).  Inability to move (extremely rare). BEFORE THE PROCEDURE  You may have blood tests done. These tests can help tell how well your kidneys and liver are working. They can also show how well your blood clots.   If you take blood thinners (anticoagulant medicine), ask your health care provider if and when you should stop taking them.   Your health care provider may order a CT scan of your brain.  Make arrangements for someone to drive you home after the procedure.  PROCEDURE  You will be positioned so that the spaces between the bones of the spine (vertebrae) are as wide as possible. This will make it easier to pass the needle into the spinal canal.  Depending on your age and size, you may lie on your side, curled up with your knees under your chin. Or, you may sit with your head resting on a pillow that is placed at waist level.  The skin covering the lower back (or lumbar region) will be cleaned.   The skin may be numbed with medicine.  You may be given pain medicine or a medicine to help you relax (sedative).  A small needle will be inserted in the skin until it enters the space that contains the spinal fluid. The needle will not enter the spinal cord.   The spinal fluid will be collected into tubes.   The needle will be withdrawn, and a bandage will be placed on the site.  AFTER THE PROCEDURE  You will remain lying down for 1 hour or for as long as your health care provider suggests.   The spinal fluid will be sent to a laboratory to be examined. The results of the examination may be available before you go home.  A test, called a culture, may be taken of the spinal fluid if your health care  provider thinks you have an infection. If cultures were taken for exam, the results will usually be available in a couple of days.  Document Released: 06/01/2000 Document Revised: 03/25/2013 Document Reviewed: 02/09/2013 [facility] Patient Information 2014 [place].

## 2013-09-17 ENCOUNTER — Telehealth (HOSPITAL_COMMUNITY): Payer: Self-pay

## 2013-09-17 ENCOUNTER — Ambulatory Visit (HOSPITAL_COMMUNITY): Payer: Medicare Other | Admitting: Physical Therapy

## 2013-09-18 LAB — VDRL, CSF: VDRL Quant, CSF: NONREACTIVE

## 2013-09-21 ENCOUNTER — Ambulatory Visit (HOSPITAL_COMMUNITY)
Admission: RE | Admit: 2013-09-21 | Discharge: 2013-09-21 | Disposition: A | Discharge status: Home / Self Care | Payer: Medicare Other | Source: Ambulatory Visit | Attending: Pulmonary Disease | Admitting: Pulmonary Disease

## 2013-09-21 DIAGNOSIS — IMO0001 Reserved for inherently not codable concepts without codable children: Secondary | ICD-10-CM | POA: Insufficient documentation

## 2013-09-21 DIAGNOSIS — M6281 Muscle weakness (generalized): Secondary | ICD-10-CM | POA: Insufficient documentation

## 2013-09-21 DIAGNOSIS — R269 Unspecified abnormalities of gait and mobility: Secondary | ICD-10-CM | POA: Insufficient documentation

## 2013-09-21 DIAGNOSIS — G5790 Unspecified mononeuropathy of unspecified lower limb: Secondary | ICD-10-CM | POA: Insufficient documentation

## 2013-09-21 DIAGNOSIS — R262 Difficulty in walking, not elsewhere classified: Secondary | ICD-10-CM

## 2013-09-21 NOTE — Progress Notes (Signed)
Physical Therapy Treatment Patient Details  Name: Dylan Nelson MRN: [id number] Date of Birth: 03/25/1934  Today's Date: 09/21/2013 Time: 1345-1430 PT Time Calculation (min): 45 min Charges : 64  TE  Visit#: 4 of 8  Re-eval: 10/07/13 Assessment Diagnosis: ataxic gait, peripheral neuropathy  Next MD Visit: Phillips Odor MD   10/02/13  Prior Therapy: no   Authorization: Medicare/ BCBS   Authorization Time Period:    Authorization Visit#: 4 of 8   Subjective: Symptoms/Limitations Symptoms: no new complaints, no pain , did okay with lumbar puncture procedure the other day  Pertinent History: 80 year old male , lives with wife , house adapted, sliding transfers without sliding board , unknown cause of msucle weakness  Patient Stated Goals: improve strength, tranfser beter   Precautions/Restrictions     Exercise/Treatments Mobility/Balance        Stretches Lower Trunk Rotation: 5 reps;Limitations Lower Trunk Rotation Limitations: therapist resistance  Aerobic   Machines for Strengthening   Standing   Seated Other Seated Lumbar Exercises: seated LAQ 10x 3# with 5 sec holds, seated hip flexion 15x, seated forward  weight shifiting with clasped arm reach 15x  Other Seated Lumbar Exercises: wheelchair hamstring curls B to propel chair 50 feet across clinic floor  Supine Ab Set: 10 reps Clam: 10 reps;Limitations Clam Limitations: green t band  Bent Knee Raise: 10 reps Straight Leg Raise: 10 reps Other Supine Lumbar Exercises: seated ball squeeze in hoooklying 10x 5 sec holds  Other Supine Lumbar Exercises: hs curls with heel on green ball 15x then therapist resistaed lep extension      Physical Therapy Assessment and Plan PT Assessment and Plan Clinical Impression Statement: improved weight shifting forward and lateral to use sliding board and prepare fopr sliding board tranfsers. poor eccentric hip flexor and quadriceps noted during seated and supine exercises. mod  asssit transfer wheelchair to table, min assist to CGA treatment table back to chair, consult with orthotist next visit during therapy session , patient referenced having polio as a kid , so there could be post polio weakness , patients  ankle ROM DF limited B with no active foot or ankle movements  PT Plan: Continue to progress safe use of sliding board, sitting and table based leg strengthening exercises. Progressing home program     Goals PT Short Term Goals Time to Complete Short Term Goals: 4 weeks PT Short Term Goal 1: safely and independently use transfer sliding board in clinic from scooter chair to treatment table to allow for greater ease with transfers at home from scooter to wheelchair and other surfaces  PT Short Term Goal 1 - Progress: Progressing toward goal PT Short Term Goal 2: patient MMT seated hip flexion 4/5 for transfers and leg management in bed  PT Short Term Goal 2 - Progress: Progressing toward goal PT Short Term Goal 3: patient  to dispaly weight bearing and pushing through legs during sliding tranfers for improved safety   PT Short Term Goal 3 - Progress: Progressing toward goal  Problem List Patient Active Problem List   Diagnosis Date Noted  . Muscle weakness (generalized) 09/07/2013  . Unable to walk 09/07/2013    PT - End of Session Equipment Utilized During Treatment: Gait belt PT Plan of Care Consulted and Agree with Plan of Care: Patient  GP    Jaxston Chohan 09/21/2013, 3:48 PM

## 2013-09-24 ENCOUNTER — Ambulatory Visit (HOSPITAL_COMMUNITY)
Admission: RE | Admit: 2013-09-24 | Discharge: 2013-09-24 | Disposition: A | Discharge status: Home / Self Care | Payer: Medicare Other | Source: Ambulatory Visit | Attending: Pulmonary Disease | Admitting: Pulmonary Disease

## 2013-09-24 DIAGNOSIS — M6281 Muscle weakness (generalized): Secondary | ICD-10-CM

## 2013-09-24 DIAGNOSIS — R262 Difficulty in walking, not elsewhere classified: Secondary | ICD-10-CM

## 2013-09-24 NOTE — Progress Notes (Addendum)
Physical Therapy Treatment Patient Details  Name: Dylan Nelson MRN: [id number] Date of Birth: 1934-04-06  Today's Date: 09/24/2013 Time: 1345-1430 PT Time Calculation (min): 45 min Charges: Ther Ex [id number], Ther act [id number] and 1425-1430  Visit#: 5 of 8  Re-eval: 10/07/13 Assessment Diagnosis: ataxic gait, peripheral neuropathy  Next MD Visit: Phillips Odor MD   10/02/13  Prior Therapy: no   Authorization: Medicare/ BCBS   Authorization Visit#: 5 of 8   Subjective: Symptoms/Limitations Symptoms: Patient states no c/o pain, Patient states he feels he is learning how to transfer better and is gettingin and out of bed better at home; no change in symptoms.   Exercise/Treatments Supine Ab Set: 10 reps Clam: 10 reps;Limitations Clam Limitations: green t band  Heel Slides: 10 reps Bent Knee Raise: 10 reps Straight Leg Raise: 10 reps Other Supine Lumbar Exercises: seated ball squeeze in hoooklying 10x 5 sec holds  Other Supine Lumbar Exercises: hs curls with heel on green ball 15x then therapist resistaed lep extension     Physical Therapy Assessment and Plan PT Assessment and Plan Clinical Impression Statement: improved weight shifting forward and lateral to use sliding board and prepare fopr sliding board transfers. poor eccentric hip flexor and quadriceps noted during seated and supine exercises. mod asssit transfer wheelchair to table, min assist to CGA for table back to chair transfer, consulted with orthotist during therapy session , patient referenced having polio as a kid , so there could be post polio weakness, patients ankle ROM DF limited B with no active foot or ankle movements. Patient stated significant testicular swelling he had mentioned to  MD who stated it was due to LE swelling, though it is this therapists opinion that it should be further investigates to rule out other possible pathologies. Orthotist attended session to determine potential orthoic prescription  to improve ankle and knee stability for standing transfers. Patient was measured for AFO prescription.     Pt will benefit from skilled therapeutic intervention in order to improve on the following deficits: Decreased mobility;Decreased strength;Decreased activity tolerance Rehab Potential: Fair Clinical Impairments Affecting Rehab Potential: motivated to improve tranfsers using sliding board, rehab potential foar to good to reach goals, poor prognosis for standing tolerance   PT Treatment/Interventions: DME instruction;Functional mobility training;Therapeutic activities;Therapeutic exercise;Patient/family education PT Plan: Continue to progress safe use of sliding board for transfers to and from wheel chair with cuing to focus on forward weight shifting. Continue sitting and table based leg strengthening exercises. Progressing home program. Determine if body weight support treadmill use is appropriate.     Goals PT Short Term Goals PT Short Term Goal 1 - Progress: Progressing toward goal PT Short Term Goal 2 - Progress: Progressing toward goal PT Short Term Goal 3 - Progress: Progressing toward goal  Problem List Patient Active Problem List   Diagnosis Date Noted  . Muscle weakness (generalized) 09/07/2013  . Unable to walk 09/07/2013    PT - End of Session Activity Tolerance: Patient tolerated treatment well General Behavior During Therapy: [facility] for tasks assessed/performed PT Plan of Care Consulted and Agree with Plan of Care: Patient  GP    Suzette Battiest Brissa Asante PT DPT 09/24/2013, 2:30 PM

## 2013-09-28 ENCOUNTER — Ambulatory Visit (HOSPITAL_COMMUNITY)
Admission: RE | Admit: 2013-09-28 | Discharge: 2013-09-28 | Disposition: A | Discharge status: Home / Self Care | Payer: Medicare Other | Source: Ambulatory Visit | Attending: Neurology | Admitting: Neurology

## 2013-09-28 DIAGNOSIS — R262 Difficulty in walking, not elsewhere classified: Secondary | ICD-10-CM

## 2013-09-28 DIAGNOSIS — M6281 Muscle weakness (generalized): Secondary | ICD-10-CM

## 2013-09-28 NOTE — Progress Notes (Signed)
Physical Therapy Treatment Patient Details  Name: Braian Tijerina MRN: [id number] Date of Birth: Feb 27, 1934  Today's Date: 09/28/2013 Time: 1350-1430 PT Time Calculation (min): 40 min Charge : TA: 1350-1400, TE 4098-1191  Visit#: 6 of 8  Re-eval: 10/07/13 Assessment Diagnosis: ataxic gait, peripheral neuropathy  Next MD Visit: Phillips Odor MD   10/02/13  Prior Therapy: no   Authorization: Medicare/ BCBS   Authorization Time Period:    Authorization Visit#: 6 of 8   Subjective: Symptoms/Limitations Symptoms: No pain.  Reports small progressing with therapy so far.  Pain Assessment Currently in Pain?: No/denies  Objecrtive  Exercise/Treatments Mobility/Balance  Transfers Transfers: Lateral/Scoot Transfers Lateral/Scoot Transfers: 6: Modified independent (Device/Increase time)   sliding board... Min-mod assistance  Seated Long Arc Quad on Chair: Both;10 reps;Weights LAQ on Chair Weights (lbs): 3 Other Seated Lumbar Exercises: seated LAQ 10x 3# with 5 sec holds, seated hip flexion 15x, seated forward  weight shifiting with clasped arm reach 15x ; chair push ups 5" holds Other Seated Lumbar Exercises: wheelchair hamstring curls B to propel chair 50 feet across clinic floor  Supine Clam: 10 reps;Limitations Clam Limitations: green t band  Bridge: 10 reps;Limitations Straight Leg Raise: 10 reps Other Supine Lumbar Exercises: adduction in hooklying position 10x Other Supine Lumbar Exercises: hs curls with heel on green ball 15x then therapist resistaed lep extension     Physical Therapy Assessment and Plan PT Assessment and Plan Clinical Impression Statement: Continued session focus on improving functional independence with transfers, pt able to verbalize appropriate technique with aide of sliding board from wheelchair to mat.  Min-mod assistance required with transfers today.  Therex focus on improving core strength with sitting balance activities with no HHA and LE  strengthening activities.  Pt limited by fatigue at end of session, no reports of pain.   PT Plan: Continue to progress safe use of sliding board, sitting balance and functional strengthening.  F/U on orthoic prescription for ankle and knee stability and AFO. May progress to BWS on treadmill once pt has brace.  F/U with MD apt tomorrow.      Goals PT Short Term Goals Time to Complete Short Term Goals: 4 weeks PT Short Term Goal 1: safely and independently use transfer sliding board in clinic from scooter chair to treatment table to allow for greater ease with transfers at home from scooter to wheelchair and other surfaces  PT Short Term Goal 1 - Progress: Progressing toward goal PT Short Term Goal 2: patient MMT seated hip flexion 4/5 for transfers and leg management in bed  PT Short Term Goal 2 - Progress: Progressing toward goal PT Short Term Goal 3: patient  to dispaly weight bearing and pushing through legs during sliding tranfers for improved safety   PT Short Term Goal 3 - Progress: Progressing toward goal  Problem List Patient Active Problem List   Diagnosis Date Noted  . Muscle weakness (generalized) 09/07/2013  . Unable to walk 09/07/2013    PT - End of Session Equipment Utilized During Treatment: Gait belt Activity Tolerance: Patient tolerated treatment well General Behavior During Therapy: [facility] for tasks assessed/performed  GP    Aldona Lento 09/28/2013, 4:37 PM

## 2013-10-01 ENCOUNTER — Ambulatory Visit (HOSPITAL_COMMUNITY)
Admission: RE | Admit: 2013-10-01 | Discharge: 2013-10-01 | Disposition: A | Discharge status: Home / Self Care | Payer: Medicare Other | Source: Ambulatory Visit | Attending: Pulmonary Disease | Admitting: Pulmonary Disease

## 2013-10-01 NOTE — Evaluation (Addendum)
Physical Therapy Re-Evaluation  Patient Details  Name: Dylan Nelson MRN: [id number] Date of Birth: 12-13-1933  Today's Date: 10/01/2013 Time: 9811-9147 PT Time Calculation (min): 41 min   [place] Activities [id number]           Visit#: 7 of 8  Re-eval: 10/07/13 Assessment Diagnosis: ataxic gait, peripheral neuropathy  Next MD Visit: Phillips Odor MD   10/02/13  Prior Therapy: no   Authorization: Medicare/ BCBS     Authorization Time Period: Requesting an additioanl 8 sessions to contineu progressing functional mobility and gait trainign following arrival of orthotics.   Authorization Visit#: 7 of 8   Past Medical History:  Past Medical History  Diagnosis Date  . Hypertension   . Neuropathy   . Prostate enlargement    Past Surgical History: No past surgical history on file.  Subjective Symptoms/Limitations Symptoms: Continues to have no pain. notes improved ability to transfer since getting slide board.  Pertinent History: 80 year old male , lives with wife , house adapted, sliding transfers without sliding board , unknown cause of msucle weakness  Limitations: Standing;Walking;House hold activities How long can you walk comfortably?: unable to walk or stand x 10 + years  Patient Stated Goals: improve strength, tranfser beter  Pain Assessment Currently in Pain?: No/denies  Assessment RLE Strength Right Hip Flexion: 3/5 Right Hip ABduction: 2+/5 Right Hip ADduction: 3/5 Right Knee Flexion: 3/5 Right Knee Extension: 4/5 Right Ankle Dorsiflexion: 0/5 Right Ankle Plantar Flexion: 3/5 RLE Tone RLE Tone: Hypotonic LLE AROM (degrees) Left Ankle Dorsiflexion: 0 Left Ankle Plantar Flexion: 0 LLE Strength Left Hip Flexion: 3/5 Left Hip ABduction: 2+/5 Left Knee Flexion: 3/5 Left Knee Extension: 5/5 Left Ankle Dorsiflexion: 0/5 Left Ankle Plantar Flexion: 0/5 LLE Tone LLE Tone: Hypotonic  Exercise/Treatments Mobility/Balance  Transfers Transfers:  Lateral/Scoot Transfers Lateral/Scoot Transfers: 6: Modified independent (Device/Increase time) Ambulation/Gait Ambulation/Gait: No Wheelchair Mobility Wheelchair Mobility: Yes (motorized scooter )   Supine Clam: 10 reps;Limitations Clam Limitations: Blue Tband Bridge: 10 reps;Limitations Straight Leg Raise: 10 reps  Physical Therapy Assessment and Plan PT Assessment and Plan Clinical Impression Statement: Continued focus on improving functional independence with transfers. Patient was able to verbalize appropriate technique with aide of sliding board from wheelchair to mat, and patient demonstrated independence with slide board transfer to and from mat. TherEx focus on improving core strength with sitting balance activities with no HHA and LE strengthening activities. Orthotist also present for current session to measure patient for orthotics (took 30 minutes). Patient was reeducated in HEP and importance of exercise adherence to continue to progress strength Therapy to be placed on hold for next three weeks until orthotics are ready with patient performing HEP for continued strengthening and mobility. Following arrival of orthotics patient will require additional therapy visits for gait training with orthotics and functional mobility training top improve independence with all transfers. Requesting an additional 8 visits so patient may continue physical therapy 2x weekly for 4 weeks following arrival of orthotics that will improve patient ankle and knee stability. Pt will benefit from skilled therapeutic intervention in order to improve on the following deficits: Decreased mobility;Decreased strength;Decreased activity tolerance Rehab Potential: Fair Clinical Impairments Affecting Rehab Potential: motivated to improve tranfsers using sliding board, rehab potential foar to good to reach goals, poor prognosis for standing tolerance   PT Frequency: Min 2X/week PT Duration: 4 weeks PT  Treatment/Interventions: DME instruction;Functional mobility training;Therapeutic activities;Therapeutic exercise;Patient/family education PT Plan: Continue to progress safe use of sliding board, sitting balance  and functional strengthening.  F/U on orthoic prescription for ankle and knee stability and AFO. May progress to BWS on treadmill once pt has brace.  F/U with MD apt tomorrow.      Goals PT Short Term Goals Time to Complete Short Term Goals: Other (comment) (7 weeks due to 3 week hold on therapy for waiting on arrival of ankle foot orthosis ) PT Short Term Goal 1: safely and independently use transfer sliding board in clinic from scooter chair to treatment table to allow for greater ease with transfers at home from scooter to wheelchair and other surfaces  PT Short Term Goal 1 - Progress: Partly met PT Short Term Goal 2: patient MMT seated hip flexion 4/5 for transfers and leg management in bed  PT Short Term Goal 2 - Progress: Progressing toward goal PT Short Term Goal 3: patient  to dispaly weight bearing and pushing through legs during sliding tranfers for improved safety   PT Short Term Goal 3 - Progress: Progressing toward goal  Problem List Patient Active Problem List   Diagnosis Date Noted  . Muscle weakness (generalized) 09/07/2013  . Unable to walk 09/07/2013    PT - End of Session Equipment Utilized During Treatment: Gait belt Activity Tolerance: Patient tolerated treatment well General Behavior During Therapy: [facility] for tasks assessed/performed  GP Functional Assessment Tool Used: FOTO  Functional Limitation: Changing and maintaining body position Changing and Maintaining Body Position Current Status (X4585): At least 60 percent but less than 80 percent impaired, limited or restricted Changing and Maintaining Body Position Goal Status (F2924): At least 40 percent but less than 60 percent impaired, limited or restricted  Elige Shouse R Trystin Terhune 10/01/2013, 6:30 PM  Physician  Documentation Your signature is required to indicate approval of the treatment plan as stated above.  Please sign and either send electronically or make a copy of this report for your files and return this physician signed original.   Please mark one 1.__approve of plan  2. ___approve of plan with the following conditions.   ______________________________                                                          _____________________ Physician Signature                                                                                                             Date

## 2013-10-22 ENCOUNTER — Ambulatory Visit (HOSPITAL_COMMUNITY)
Admission: RE | Admit: 2013-10-22 | Discharge: 2013-10-22 | Disposition: A | Discharge status: Home / Self Care | Payer: Medicare Other | Source: Ambulatory Visit | Attending: Pulmonary Disease | Admitting: Pulmonary Disease

## 2013-10-22 DIAGNOSIS — IMO0001 Reserved for inherently not codable concepts without codable children: Secondary | ICD-10-CM | POA: Insufficient documentation

## 2013-10-22 DIAGNOSIS — M6281 Muscle weakness (generalized): Secondary | ICD-10-CM | POA: Insufficient documentation

## 2013-10-22 DIAGNOSIS — G5790 Unspecified mononeuropathy of unspecified lower limb: Secondary | ICD-10-CM | POA: Insufficient documentation

## 2013-10-22 DIAGNOSIS — R262 Difficulty in walking, not elsewhere classified: Secondary | ICD-10-CM

## 2013-10-22 DIAGNOSIS — R269 Unspecified abnormalities of gait and mobility: Secondary | ICD-10-CM | POA: Insufficient documentation

## 2013-10-22 NOTE — Progress Notes (Signed)
Physical Therapy Treatment Patient Details  Name: Dylan Nelson MRN: [id number] Date of Birth: 28-Jan-1934  Today's Date: 10/22/2013 Time: 1540-1630 PT Time Calculation (min): 50 min Charge: TA 1540-1600, TE 1093-2355  Visit#: 8 of 15  Re-eval: 10/29/13 Assessment Diagnosis: ataxic gait, peripheral neuropathy  Next MD Visit: Phillips Odor MD  11/08/2013 Prior Therapy: no   Authorization: Medicare/ BCBS   Authorization Time Period: Requesting an additioanl 8 sessions to contineu progressing functional mobility and gait trainign following arrival of orthotics.   Authorization Visit#: 8 of 18   Subjective: Symptoms/Limitations Symptoms: Pt c/o burning and redness Bil feet, expecting Orthotist this session for brace  Pain Assessment Currently in Pain?: Yes Pain Score: 10-Worst pain ever Pain Location: Foot Pain Orientation: Right;Left  Objective:  Exercise/Treatments Mobility/Balance  Bed Mobility Bed Mobility: Right Sidelying to Sit;Left Sidelying to Sit Right Sidelying to Sit: 4: Min assist Left Sidelying to Sit: 4: Min assist Transfers Transfers: Lateral/Scoot Transfers Lateral/Scoot Transfers: 3: Mod assist Research officer, trade union Details (indicate cue type and reason): cueing for handplacement     Seated Long Arc Quad on Chair: Both;15 reps;Weights LAQ on Chair Weights (lbs): 3 Other Seated Lumbar Exercises: seated balance training ontop of dynadisc, seated  Other Seated Lumbar Exercises: wheelchair hamstring curls B to propel chair 50 feet across clinic floor  Supine Glut Set: 10 reps Clam: 15 reps;Limitations Clam Limitations: Blue Tband Bridge: 10 reps;Limitations Straight Leg Raise: 10 reps Sidelying Clam: 5 reps;5 seconds;Limitations Clam Limitations: AAROM     Physical Therapy Assessment and Plan PT Assessment and Plan Clinical Impression Statement: Original plan to meet Orthotist for pt to receive orthorics, tried to contact 4 times, unable to reach.   Following discussion with Dylan Nelson, PT continued session with focus on bed mobitliy, transfer training and core strengthening.  Pt required multimodal to improve bed mobility with min assistance required with rolling and supine to sit.  Added dynadisc wtih seated balance activtieis with no UE or LE assistance.  Pt continues to demonstrate very weak core and gluteal muscualture, exercises complete to improve functional strengthening.  Pt c/o Bil feet burning and numbness through session PT Plan: Continue to progress safe use of sliding board, sitting balance and functional strengthening.  F/U on orthoic prescription for ankle and knee stability and AFO. May progress to BWS on treadmill once pt has brace.  F/U with MD apt tomorrow.      Goals PT Short Term Goals Time to Complete Short Term Goals: Other (comment) (7 weeks due to 3 week hold on therapy for waiting on arrival of ankle foot orthosis ) PT Short Term Goal 1: safely and independently use transfer sliding board in clinic from scooter chair to treatment table to allow for greater ease with transfers at home from scooter to wheelchair and other surfaces  PT Short Term Goal 1 - Progress: Progressing toward goal PT Short Term Goal 2: patient MMT seated hip flexion 4/5 for transfers and leg management in bed  PT Short Term Goal 2 - Progress: Progressing toward goal PT Short Term Goal 3: patient  to dispaly weight bearing and pushing through legs during sliding tranfers for improved safety   PT Short Term Goal 3 - Progress: Progressing toward goal  Problem List Patient Active Problem List   Diagnosis Date Noted  . Muscle weakness (generalized) 09/07/2013  . Unable to walk 09/07/2013    PT - End of Session Activity Tolerance: Patient tolerated treatment well General Behavior During Therapy: [facility] for  tasks assessed/performed  GP    Aldona Lento 10/22/2013, 4:52 PM

## 2013-10-27 ENCOUNTER — Telehealth (HOSPITAL_COMMUNITY): Payer: Self-pay

## 2013-10-27 ENCOUNTER — Ambulatory Visit (HOSPITAL_COMMUNITY)
Admission: RE | Admit: 2013-10-27 | Discharge: 2013-10-27 | Disposition: A | Discharge status: Home / Self Care | Payer: Medicare Other | Source: Ambulatory Visit | Attending: Pulmonary Disease | Admitting: Pulmonary Disease

## 2013-10-27 DIAGNOSIS — R262 Difficulty in walking, not elsewhere classified: Secondary | ICD-10-CM

## 2013-10-27 DIAGNOSIS — M6281 Muscle weakness (generalized): Secondary | ICD-10-CM

## 2013-10-27 NOTE — Progress Notes (Signed)
Physical Therapy Treatment Patient Details  Name: Dylan Nelson MRN: [id number] Date of Birth: May 25, 1934  Today's Date: 10/27/2013 Time: 1015-1100 PT Time Calculation (min): 45 min Charge: TA 1015-1030, TE 1030-1100  Visit#: 9 of 15  Re-eval: 10/29/13 Assessment Diagnosis: ataxic gait, peripheral neuropathy  Next MD Visit: Phillips Odor MD  11/08/2013 Prior Therapy: no   Authorization: Medicare/ BCBS   Authorization Time Period: Requesting an additioanl 8 sessions to contineu progressing functional mobility and gait trainign following arrival of orthotics.   Authorization Visit#: 9 of 18   Subjective: Symptoms/Limitations Symptoms: Still awaiting arrival of Orthotist with brace, unable to contract.  Pt c/o burning Bil feet 10/10 Pain Assessment Currently in Pain?: Yes Pain Score: 10-Worst pain ever Pain Location: Foot Pain Orientation: Right;Left  Objective:   Exercise/Treatments Mobility/Balance  Transfers Transfers: Lateral/Scoot Transfers Lateral/Scoot Transfers: 5: Supervision Lateral/Scoot Transfer Details (indicate cue type and reason): 3 RT down mat for glut strengthening.      Supine Clam: 15 reps;Limitations Clam Limitations: Blue Tband Bent Knee Raise: 10 reps;5 seconds Bridge: 20 reps;Limitations Bridge Limitations: glut sets Straight Leg Raise: 10 reps Other Supine Lumbar Exercises: crunches 20x 5"  Other Supine Lumbar Exercises: hs curls with heel on green ball 15x then therapist resistaed lep extension  Sidelying Clam: 10 reps;5 seconds Clam Limitations: AAROM    Physical Therapy Assessment and Plan PT Assessment and Plan Clinical Impression Statement: Session focus on core and gluteal strengthening to imprpove mobility with transfers, min guard required with transfer from [facility] <-> mat.  Pt continues to demontrate impaired proprioception and coordination with foot placement due to weak core and gluteal musculature. PT Plan: Continue to progress safe  use of sliding board, sitting balance and functional strengthening.  F/U on orthoic prescription for ankle and knee stability and AFO. May progress to BWS on treadmill once pt has brace.  F/U with MD apt tomorrow.      Goals PT Short Term Goals Time to Complete Short Term Goals: Other (comment) (7 weeks due to 3 week hold on therapy for waiting on arrival of ankle foot orthosis ) PT Short Term Goal 1: safely and independently use transfer sliding board in clinic from scooter chair to treatment table to allow for greater ease with transfers at home from scooter to wheelchair and other surfaces  PT Short Term Goal 1 - Progress: Progressing toward goal PT Short Term Goal 2: patient MMT seated hip flexion 4/5 for transfers and leg management in bed  PT Short Term Goal 2 - Progress: Progressing toward goal PT Short Term Goal 3: patient  to dispaly weight bearing and pushing through legs during sliding tranfers for improved safety   PT Short Term Goal 3 - Progress: Progressing toward goal  Problem List Patient Active Problem List   Diagnosis Date Noted  . Muscle weakness (generalized) 09/07/2013  . Unable to walk 09/07/2013    PT - End of Session Equipment Utilized During Treatment: Gait belt Activity Tolerance: Patient tolerated treatment well General Behavior During Therapy: [facility] for tasks assessed/performed  GP    Aldona Lento 10/27/2013, 11:29 AM

## 2013-10-29 ENCOUNTER — Ambulatory Visit (HOSPITAL_COMMUNITY): Payer: Medicare Other | Admitting: Physical Therapy

## 2013-11-02 ENCOUNTER — Emergency Department (HOSPITAL_COMMUNITY): Payer: Medicare Other

## 2013-11-02 ENCOUNTER — Inpatient Hospital Stay (HOSPITAL_COMMUNITY)
Admission: EM | Admit: 2013-11-02 | Discharge: 2013-11-07 | DRG: 948 | Disposition: A | Discharge status: Skilled Nursing Facility | Payer: Medicare Other | Attending: Pulmonary Disease | Admitting: Pulmonary Disease

## 2013-11-02 ENCOUNTER — Encounter (HOSPITAL_COMMUNITY): Payer: Self-pay | Admitting: Emergency Medicine

## 2013-11-02 DIAGNOSIS — N498 Inflammatory disorders of other specified male genital organs: Secondary | ICD-10-CM | POA: Diagnosis present

## 2013-11-02 DIAGNOSIS — G5790 Unspecified mononeuropathy of unspecified lower limb: Secondary | ICD-10-CM | POA: Diagnosis present

## 2013-11-02 DIAGNOSIS — G6181 Chronic inflammatory demyelinating polyneuritis: Secondary | ICD-10-CM | POA: Diagnosis present

## 2013-11-02 DIAGNOSIS — Z87891 Personal history of nicotine dependence: Secondary | ICD-10-CM

## 2013-11-02 DIAGNOSIS — N492 Inflammatory disorders of scrotum: Secondary | ICD-10-CM | POA: Diagnosis present

## 2013-11-02 DIAGNOSIS — D72829 Elevated white blood cell count, unspecified: Secondary | ICD-10-CM | POA: Diagnosis present

## 2013-11-02 DIAGNOSIS — G629 Polyneuropathy, unspecified: Secondary | ICD-10-CM | POA: Diagnosis present

## 2013-11-02 DIAGNOSIS — G589 Mononeuropathy, unspecified: Secondary | ICD-10-CM

## 2013-11-02 DIAGNOSIS — I959 Hypotension, unspecified: Secondary | ICD-10-CM | POA: Diagnosis present

## 2013-11-02 DIAGNOSIS — R609 Edema, unspecified: Principal | ICD-10-CM | POA: Diagnosis present

## 2013-11-02 DIAGNOSIS — L02828 Furuncle of other sites: Secondary | ICD-10-CM | POA: Diagnosis present

## 2013-11-02 DIAGNOSIS — R600 Localized edema: Secondary | ICD-10-CM

## 2013-11-02 DIAGNOSIS — E876 Hypokalemia: Secondary | ICD-10-CM | POA: Diagnosis present

## 2013-11-02 DIAGNOSIS — L02838 Carbuncle of other sites: Secondary | ICD-10-CM

## 2013-11-02 DIAGNOSIS — E669 Obesity, unspecified: Secondary | ICD-10-CM | POA: Diagnosis present

## 2013-11-02 DIAGNOSIS — Z6832 Body mass index (BMI) 32.0-32.9, adult: Secondary | ICD-10-CM

## 2013-11-02 DIAGNOSIS — I10 Essential (primary) hypertension: Secondary | ICD-10-CM | POA: Diagnosis present

## 2013-11-02 DIAGNOSIS — IMO0002 Reserved for concepts with insufficient information to code with codable children: Secondary | ICD-10-CM

## 2013-11-02 DIAGNOSIS — T380X5A Adverse effect of glucocorticoids and synthetic analogues, initial encounter: Secondary | ICD-10-CM | POA: Diagnosis present

## 2013-11-02 DIAGNOSIS — E538 Deficiency of other specified B group vitamins: Secondary | ICD-10-CM | POA: Diagnosis present

## 2013-11-02 DIAGNOSIS — Z993 Dependence on wheelchair: Secondary | ICD-10-CM

## 2013-11-02 LAB — HEPATIC FUNCTION PANEL
ALT: 17 U/L (ref 0–53)
AST: 12 U/L (ref 0–37)
Albumin: 2.1 g/dL — ABNORMAL LOW (ref 3.5–5.2)
Alkaline Phosphatase: 61 U/L (ref 39–117)
BILIRUBIN TOTAL: 0.7 mg/dL (ref 0.3–1.2)
Bilirubin, Direct: 0.3 mg/dL (ref 0.0–0.3)
Indirect Bilirubin: 0.4 mg/dL (ref 0.3–0.9)
Total Protein: 5.1 g/dL — ABNORMAL LOW (ref 6.0–8.3)

## 2013-11-02 LAB — URINALYSIS, ROUTINE W REFLEX MICROSCOPIC
Glucose, UA: NEGATIVE mg/dL
Hgb urine dipstick: NEGATIVE
Ketones, ur: NEGATIVE mg/dL
Leukocytes, UA: NEGATIVE
Nitrite: NEGATIVE
Protein, ur: NEGATIVE mg/dL
Specific Gravity, Urine: 1.01 (ref 1.005–1.030)
Urobilinogen, UA: 0.2 mg/dL (ref 0.0–1.0)
pH: 6 (ref 5.0–8.0)

## 2013-11-02 LAB — BASIC METABOLIC PANEL
BUN: 34 mg/dL — ABNORMAL HIGH (ref 6–23)
CO2: 26 mEq/L (ref 19–32)
Calcium: 9.2 mg/dL (ref 8.4–10.5)
Chloride: 98 mEq/L (ref 96–112)
Creatinine, Ser: 0.77 mg/dL (ref 0.50–1.35)
GFR calc Af Amer: >90 mL/min (ref >90)
GFR calc non Af Amer: 85 mL/min — ABNORMAL LOW (ref >90)
Glucose, Bld: 165 mg/dL — ABNORMAL HIGH (ref 70–99)
Potassium: 4.6 mEq/L (ref 3.7–5.3)
Sodium: 135 mEq/L — ABNORMAL LOW (ref 137–147)

## 2013-11-02 LAB — CBC WITH DIFFERENTIAL/PLATELET
Basophils Absolute: 0 10*3/uL (ref 0.0–0.1)
Basophils Relative: 0 % (ref 0–1)
Eosinophils Absolute: 0 10*3/uL (ref 0.0–0.7)
Eosinophils Relative: 0 % (ref 0–5)
HCT: 37.5 % — ABNORMAL LOW (ref 39.0–52.0)
Hemoglobin: 12.7 g/dL — ABNORMAL LOW (ref 13.0–17.0)
Lymphocytes Relative: 4 % — ABNORMAL LOW (ref 12–46)
Lymphs Abs: 1 10*3/uL (ref 0.7–4.0)
MCH: 30.1 pg (ref 26.0–34.0)
MCHC: 33.9 g/dL (ref 30.0–36.0)
MCV: 88.9 fL (ref 78.0–100.0)
Monocytes Absolute: 1.2 10*3/uL — ABNORMAL HIGH (ref 0.1–1.0)
Monocytes Relative: 6 % (ref 3–12)
Neutro Abs: 20.2 10*3/uL — ABNORMAL HIGH (ref 1.7–7.7)
Neutrophils Relative %: 90 % — ABNORMAL HIGH (ref 43–77)
Platelets: 230 10*3/uL (ref 150–400)
RBC: 4.22 MIL/uL (ref 4.22–5.81)
RDW: 14.2 % (ref 11.5–15.5)
WBC: 22.4 10*3/uL — ABNORMAL HIGH (ref 4.0–10.5)

## 2013-11-02 LAB — TROPONIN I: Troponin I: <0.3 ng/mL (ref <0.30)

## 2013-11-02 LAB — MAGNESIUM: Magnesium: 1.8 mg/dL (ref 1.5–2.5)

## 2013-11-02 LAB — PRO B NATRIURETIC PEPTIDE: Pro B Natriuretic peptide (BNP): 139.9 pg/mL (ref 0–450)

## 2013-11-02 MED ORDER — ONDANSETRON HCL 4 MG PO TABS
4.0000 mg | ORAL_TABLET | Freq: Four times a day (QID) | ORAL | Status: DC | PRN
  Administered 2013-11-06: 4 mg via ORAL
  Filled 2013-11-02: qty 1

## 2013-11-02 MED ORDER — FUROSEMIDE 10 MG/ML IJ SOLN: 80.0000 mg | Freq: Two times a day (BID) | INTRAMUSCULAR | Status: DC

## 2013-11-02 MED ORDER — ONDANSETRON HCL 4 MG/2ML IJ SOLN: 4.0000 mg | Freq: Four times a day (QID) | INTRAMUSCULAR | Status: DC | PRN

## 2013-11-02 MED ORDER — HEPARIN SODIUM (PORCINE) 5000 UNIT/ML IJ SOLN
5000.0000 [IU] | Freq: Three times a day (TID) | INTRAMUSCULAR | Status: DC
  Administered 2013-11-02 – 2013-11-07 (×15): 5000 [IU] via SUBCUTANEOUS
  Filled 2013-11-02 (×15): qty 1

## 2013-11-02 MED ORDER — FUROSEMIDE 10 MG/ML IJ SOLN
80.0000 mg | Freq: Once | INTRAMUSCULAR | Status: AC
  Administered 2013-11-02: 80 mg via INTRAVENOUS
  Filled 2013-11-02: qty 8

## 2013-11-02 MED ORDER — METOLAZONE 2.5 MG PO TABS
2.5000 mg | ORAL_TABLET | Freq: Every day | ORAL | Status: DC
  Administered 2013-11-02 – 2013-11-04 (×3): 2.5 mg via ORAL
  Filled 2013-11-02 (×3): qty 1

## 2013-11-02 MED ORDER — POTASSIUM CHLORIDE CRYS ER 20 MEQ PO TBCR
20.0000 meq | EXTENDED_RELEASE_TABLET | Freq: Three times a day (TID) | ORAL | Status: DC
  Administered 2013-11-02 – 2013-11-04 (×8): 20 meq via ORAL
  Filled 2013-11-02 (×9): qty 1

## 2013-11-02 MED ORDER — SODIUM CHLORIDE 0.9 % IJ SOLN
3.0000 mL | Freq: Two times a day (BID) | INTRAMUSCULAR | Status: DC
  Administered 2013-11-02 – 2013-11-07 (×9): 3 mL via INTRAVENOUS

## 2013-11-02 MED ORDER — PANTOPRAZOLE SODIUM 40 MG PO TBEC
40.0000 mg | DELAYED_RELEASE_TABLET | Freq: Every day | ORAL | Status: DC
  Administered 2013-11-02 – 2013-11-07 (×6): 40 mg via ORAL
  Filled 2013-11-02 (×6): qty 1

## 2013-11-02 MED ORDER — PREDNISONE 20 MG PO TABS
20.0000 mg | ORAL_TABLET | Freq: Every day | ORAL | Status: DC
  Administered 2013-11-03: 20 mg via ORAL
  Filled 2013-11-02: qty 1

## 2013-11-02 MED ORDER — FUROSEMIDE 10 MG/ML IJ SOLN
80.0000 mg | Freq: Two times a day (BID) | INTRAMUSCULAR | Status: DC
  Administered 2013-11-02 – 2013-11-04 (×5): 80 mg via INTRAVENOUS
  Filled 2013-11-02 (×5): qty 8

## 2013-11-02 NOTE — ED Provider Notes (Signed)
CSN: [id number]     Arrival date & time 11/02/13  1038 History   None   This chart was scribed for Alonza Bogus, MD by Terressa Koyanagi, ED Scribe. This patient was seen in room A301/A301-01 and the patient's care was started at 1:12 PM. PCP: Alonza Bogus, MD  Chief Complaint  Patient presents with  . Edema   Patient is a 80 y.o. male presenting with general illness. The history is provided by the patient and a relative. No language interpreter was used.  Illness Location:  Swelling of BLE and scrotum Severity:  Severe Onset quality:  Unable to specify Timing:  Intermittent Progression:  Worsening Chronicity:  Chronic Relieved by:  Prednisone  Associated symptoms: no abdominal pain, no chest pain, no congestion, no cough, no diarrhea, no fatigue, no headaches and no rash    HPI Comments: Dylan Nelson is a 80 y.o. male, with a history of chronic, worsening swelling for the past 15 years, HTN and prostate enlargement, who presents to the Emergency Department complaining of weakness onset today. Pt also complains of associated swelling of the bilateral lower extremities onset 4 months ago.  He has a neuropathy that he's been seeing Dr.Doonquah about for several months. He has been on high-dose prednisone for the last 3 weeks, 60 mg, then 40 mg, and now 20 mg daily.  Weakness: Pt's relative reports that pt came to the ED today because of extreme weakness, to the point where pt was unable to get up from his chair with help.   BLE Edema: Pt reports he saw his PCP for his edema approximately 1 month ago when his PCP started him on Prednisone and increased his dosage of torsemide from 1 x a day to to 3x a day for first three weeks and then reduced to 2x a day and eventually going down to 1 x a day.  Pt reports at baseline he is unable to walk due to neuropathy. Further, pt's family reports that due to pt's neuropathy (ongoing for 10 years) and BLE edema he has been using a wheelchair and scooter  for mobility.   It worsened over the last few days with his increasing swelling. He is unable to get a chair with the help of 2 people today. For 5 days ago he ended up on the floor. Apparently fire department was called and took "3 guys" to get him back into his chair.      Past Medical History  Diagnosis Date  . Hypertension   . Neuropathy   . Prostate enlargement    History reviewed. No pertinent past surgical history. History reviewed. No pertinent family history. History  Substance Use Topics  . Smoking status: Former Research scientist (life sciences)  . Smokeless tobacco: Not on file  . Alcohol Use: No    Review of Systems  Constitutional: Negative for appetite change and fatigue.  HENT: Negative for congestion, ear discharge and sinus pressure.   Eyes: Negative for discharge.  Respiratory: Negative for cough.   Cardiovascular: Negative for chest pain.  Gastrointestinal: Negative for abdominal pain and diarrhea.  Genitourinary: Negative for frequency and hematuria.  Musculoskeletal: Negative for back pain.  Skin: Negative for rash.  Neurological: Negative for seizures and headaches.  Psychiatric/Behavioral: Negative for hallucinations.      Allergies  Review of patient's allergies indicates no known allergies.  Home Medications   Prior to Admission medications   Medication Sig Start Date End Date Taking? Authorizing Provider  doxazosin (CARDURA) 2 MG tablet  Take 1 tablet by mouth daily. 09/04/13  Yes Historical Provider, MD  etodolac (LODINE) 400 MG tablet Take 1 tablet by mouth 2 (two) times daily. 08/27/13  Yes Historical Provider, MD  finasteride (PROSCAR) 5 MG tablet Take 1 tablet by mouth daily. 08/27/13  Yes Historical Provider, MD  lisinopril (PRINIVIL,ZESTRIL) 10 MG tablet Take 1 tablet by mouth daily. 08/27/13  Yes Historical Provider, MD  omeprazole (PRILOSEC) 20 MG capsule Take 20 mg by mouth daily.   Yes Historical Provider, MD  potassium chloride SA (K-DUR,KLOR-CON) 20 MEQ tablet  Take 20 mEq by mouth 3 (three) times daily.   Yes Historical Provider, MD  predniSONE (DELTASONE) 20 MG tablet Take 20 mg by mouth daily with breakfast.   Yes Historical Provider, MD  torsemide (DEMADEX) 20 MG tablet Take 1 tablet by mouth daily. 08/27/13  Yes Historical Provider, MD   Triage Vitals: BP 102/70  Pulse 95  Temp(Src) 98.6 F (37 C)  Resp 19  Ht 6\' 1"  (1.854 m)  Wt 245 lb (111.131 kg)  BMI 32.33 kg/m2  SpO2 96% Physical Exam  Constitutional: He is oriented to person, place, and time. He appears well-developed.  HENT:  Head: Normocephalic.  Eyes: Conjunctivae and EOM are normal. No scleral icterus.  Neck: Neck supple. No thyromegaly present.  Cardiovascular: Normal rate and regular rhythm.  Exam reveals no gallop and no friction rub.   No murmur heard. Pulmonary/Chest: No stridor. He has no wheezes. He has no rales. He exhibits no tenderness.  Abdominal: He exhibits no distension. There is no tenderness. There is no rebound.  Musculoskeletal: Normal range of motion. He exhibits edema (3+ bilateral, symmetric lower extremity edema. No upper extremity edema. ).  Lymphadenopathy:    He has no cervical adenopathy.  Neurological: He is oriented to person, place, and time. He exhibits normal muscle tone. Coordination normal.  Skin: No rash noted. No erythema.  Psychiatric: He has a normal mood and affect. His behavior is normal.    ED Course  Procedures (including critical care time) DIAGNOSTIC STUDIES: Oxygen Saturation is 96% on RA, adequate by my interpretation.    COORDINATION OF CARE: 1:20 PM-Discussed treatment plan which includes  (CXR, CBC panel, CMP, UA) with pt at bedside and pt agreed to plan.   Labs Review Labs Reviewed  CBC WITH DIFFERENTIAL - Abnormal; Notable for the following:    WBC 22.4 (*)    Hemoglobin 12.7 (*)    HCT 37.5 (*)    Neutrophils Relative % 90 (*)    Neutro Abs 20.2 (*)    Lymphocytes Relative 4 (*)    Monocytes Absolute 1.2 (*)     All other components within normal limits  BASIC METABOLIC PANEL - Abnormal; Notable for the following:    Sodium 135 (*)    Glucose, Bld 165 (*)    BUN 34 (*)    GFR calc non Af Amer 85 (*)    All other components within normal limits  HEPATIC FUNCTION PANEL - Abnormal; Notable for the following:    Total Protein 5.1 (*)    Albumin 2.1 (*)    All other components within normal limits  URINALYSIS, ROUTINE W REFLEX MICROSCOPIC - Abnormal; Notable for the following:    Bilirubin Urine LARGE (*)    All other components within normal limits  URINE CULTURE  TROPONIN I  PRO B NATRIURETIC PEPTIDE  MAGNESIUM  COMPREHENSIVE METABOLIC PANEL  CBC  PRO B NATRIURETIC PEPTIDE  TSH  Imaging Review Dg Chest Portable 1 View  11/02/2013   CLINICAL DATA:  Leg and feet edema for more than 1 week.  EXAM: PORTABLE CHEST - 1 VIEW  COMPARISON:  02/24/2004 radiographs.  FINDINGS: The heart size is at the upper limits of normal for AP technique. The lungs are clear. There is no pleural effusion or pneumothorax. No acute osseous findings are demonstrated. Telemetry leads overlie the chest.  IMPRESSION: Stable chest.  No acute cardiopulmonary process.   Electronically Signed   By: Camie Patience M.D.   On: 11/02/2013 12:55     EKG Interpretation None      MDM   Final diagnoses:  Edema    Patient has presacral comments of some scrotal edema. Although not tense or markedly swollen with the scrotum. Diffuse 3-4+ lower extremity edema. He's been refractory to high dose Torsemide.  Did not get out of his chair by himself today. Thank you hospitalization for diuresis. He has a leukocytosis at 22,000, no sign of infection, no symptoms of infection, and no fever, and it is very likely secondary to his high dose steroid use. Placed a call to hospitalist regarding admission.  I personally performed the services described in this documentation, which was scribed in my presence. The recorded information has been  reviewed and is accurate.    Tanna Furry, MD 11/02/13 2134

## 2013-11-02 NOTE — H&P (Signed)
[facility] History and Physical  Dylan Nelson:[id number] DOB: 04-13-1934 DOA: 11/02/2013  Referring physician: ER. PCP: Alonza Bogus, MD   Chief Complaint: Bilateral leg swelling.  HPI: Dylan Nelson is a 80 y.o. male  This is a 80 year old man who presents to the emergency room with bilateral leg swelling to the point where he is really unable to walk and has not been able to get up from a chair with help. The swelling has been going on for approximately 4 months but seems to be getting worse. He also has peripheral neuropathy in his legs and this makes it difficult for him to walk also. He has been using a wheelchair and a scooter for mobility. He has been on a tapering dose of prednisone. He feels weak and fatigued. He denies any chest pain. He denies any dyspnea, no PND or orthopnea. He has no documented history of heart disease or congestive heart failure. He probably does have prostatic enlargement.   Review of Systems:  Constitutional:  No weight loss, night sweats, Fevers, chills, fatigue.  HEENT:  No headaches, Difficulty swallowing,Tooth/dental problems,Sore throat,  No sneezing, itching, ear ache, nasal congestion, post nasal drip,  Cardio-vascular:  No chest pain, Orthopnea, PND,  dizziness, palpitations  GI:  No heartburn, indigestion, abdominal pain, nausea, vomiting, diarrhea, change in bowel habits, loss of appetite  Resp:  No shortness of breath with exertion or at rest. No excess mucus, no productive cough, No non-productive cough, No coughing up of blood.No change in color of mucus.No wheezing.No chest wall deformity  Skin:  no rash or lesions.  GU:  no dysuria, change in color of urine, no urgency or frequency. No flank pain.  Musculoskeletal:  No joint pain or swelling. No decreased range of motion. No back pain.  Psych:  No change in mood or affect. No depression or anxiety. No memory loss.   Past Medical History  Diagnosis Date  . Hypertension    . Neuropathy   . Prostate enlargement    History reviewed. No pertinent past surgical history. Social History:  reports that he has quit smoking. He does not have any smokeless tobacco history on file. He reports that he does not drink alcohol or use illicit drugs.  No Known Allergies  No family history on file.   Prior to Admission medications   Medication Sig Start Date End Date Taking? Authorizing Provider  doxazosin (CARDURA) 2 MG tablet Take 1 tablet by mouth daily. 09/04/13  Yes Historical Provider, MD  etodolac (LODINE) 400 MG tablet Take 1 tablet by mouth 2 (two) times daily. 08/27/13  Yes Historical Provider, MD  finasteride (PROSCAR) 5 MG tablet Take 1 tablet by mouth daily. 08/27/13  Yes Historical Provider, MD  lisinopril (PRINIVIL,ZESTRIL) 10 MG tablet Take 1 tablet by mouth daily. 08/27/13  Yes Historical Provider, MD  omeprazole (PRILOSEC) 20 MG capsule Take 20 mg by mouth daily.   Yes Historical Provider, MD  potassium chloride SA (K-DUR,KLOR-CON) 20 MEQ tablet Take 20 mEq by mouth 3 (three) times daily.   Yes Historical Provider, MD  predniSONE (DELTASONE) 20 MG tablet Take 20 mg by mouth daily with breakfast.   Yes Historical Provider, MD  torsemide (DEMADEX) 20 MG tablet Take 1 tablet by mouth daily. 08/27/13  Yes Historical Provider, MD   Physical Exam: Filed Vitals:   11/02/13 1530  BP: 83/58  Pulse:   Temp:   Resp: 22    BP 83/58  Pulse 97  Temp(Src) 98.6  F (37 C)  Resp 22  Ht 6\' 1"  (1.854 m)  Wt 111.131 kg (245 lb)  BMI 32.33 kg/m2  SpO2 96%  General:  Appears calm and comfortable. Does not appear to be dyspneic at rest. Blood pressure somewhat soft but does not appear to be clinically shocked. Eyes: PERRL, normal lids, irises & conjunctiva ENT: grossly normal hearing, lips & tongue Neck: no LAD, masses or thyromegaly Cardiovascular: RRR, no m/r/g. Bilateral massive leg edema up to the thighs, pitting. Sinus tachycardia. Telemetry: SR, no arrhythmias   Respiratory: Inspiratory crackles in the right lower zone. Otherwise lung fields are clear. Abdomen: soft, ntnd. He has scrotal edema also. Skin: no rash or induration seen on limited exam Musculoskeletal: grossly normal tone BUE/BLE Psychiatric: grossly normal mood and affect, speech fluent and appropriate Neurologic: grossly non-focal.          Labs on Admission:  Basic Metabolic Panel:  Recent Labs Lab 11/02/13 1257 11/02/13 1305  NA 135*  --   K 4.6  --   CL 98  --   CO2 26  --   GLUCOSE 165*  --   BUN 34*  --   CREATININE 0.77  --   CALCIUM 9.2  --   MG  --  1.8   Liver Function Tests:  Recent Labs Lab 11/02/13 1305  AST 12  ALT 17  ALKPHOS 61  BILITOT 0.7  PROT 5.1*  ALBUMIN 2.1*     CBC:  Recent Labs Lab 11/02/13 1257  WBC 22.4*  NEUTROABS 20.2*  HGB 12.7*  HCT 37.5*  MCV 88.9  PLT 230   Cardiac Enzymes:  Recent Labs Lab 11/02/13 1257  TROPONINI <0.30    BNP (last 3 results)  Recent Labs  11/02/13 1257  PROBNP 139.9      Radiological Exams on Admission: Dg Chest Portable 1 View  11/02/2013   CLINICAL DATA:  Leg and feet edema for more than 1 week.  EXAM: PORTABLE CHEST - 1 VIEW  COMPARISON:  02/24/2004 radiographs.  FINDINGS: The heart size is at the upper limits of normal for AP technique. The lungs are clear. There is no pleural effusion or pneumothorax. No acute osseous findings are demonstrated. Telemetry leads overlie the chest.  IMPRESSION: Stable chest.  No acute cardiopulmonary process.   Electronically Signed   By: Camie Patience M.D.   On: 11/02/2013 12:55    EKG: Independently reviewed. Normal sinus rhythm, no acute ST-T wave changes.  Assessment/Plan Active Problems:   Bilateral leg edema   HTN (hypertension)   Neuropathy   1. Bilateral leg edema, unclear etiology. Albumin is low and oncotic pressure could be a factor. He may have venous insufficiency. 2. Hypertension, currently hypotensive. 3. Peripheral  neuropathy.  Plan: 1. Admit to medical floor. 2. Intravenous Lasix and oral metolazone to see if this will start to shift fluid. Discontinue antihypertensive medications for the time being in view of the soft blood pressure. 3. Echocardiogram to make sure he does not have any systolic dysfunction although I doubt this based on relatively normal electrocardiogram, no clinical features  congestive heart failure on clinical exam and chest x-ray.  Further recommendations will depend on patient's hospital progress.   Code Status: Full code.   Family Communication: I discussed the plan with the patient at the bedside.   Disposition Plan: Home when medically stable.   Time spent: 60 minutes.  [facility] Pager [phone].

## 2013-11-02 NOTE — ED Notes (Addendum)
Pt c/o ble swelling and scrotal swelling. H/s chronic swelling x 15 years. Pt states he has been gradually getting worse and was unable to pull himself into his truck today. Pt is currently on prednisone. Pitting edema to BLE noted. Unable to palpate dp pulses. Pt states he has been unable to wiggle digits x 14 years. Color normal/cap refil < 3 seconds. No erythema noted. Denies cp/sob. Nad noted.

## 2013-11-03 ENCOUNTER — Inpatient Hospital Stay (HOSPITAL_COMMUNITY): Payer: Medicare Other

## 2013-11-03 ENCOUNTER — Inpatient Hospital Stay (HOSPITAL_COMMUNITY): Admission: RE | Admit: 2013-11-03 | Payer: Medicare Other | Source: Ambulatory Visit

## 2013-11-03 ENCOUNTER — Other Ambulatory Visit: Payer: Self-pay | Admitting: Cardiovascular Disease

## 2013-11-03 DIAGNOSIS — I379 Nonrheumatic pulmonary valve disorder, unspecified: Secondary | ICD-10-CM

## 2013-11-03 LAB — COMPREHENSIVE METABOLIC PANEL
ALBUMIN: 2 g/dL — AB (ref 3.5–5.2)
ALT: 15 U/L (ref 0–53)
AST: 11 U/L (ref 0–37)
Alkaline Phosphatase: 68 U/L (ref 39–117)
BUN: 30 mg/dL — AB (ref 6–23)
CO2: 25 mEq/L (ref 19–32)
CREATININE: 0.82 mg/dL (ref 0.50–1.35)
Calcium: 9.4 mg/dL (ref 8.4–10.5)
Chloride: 97 mEq/L (ref 96–112)
GFR calc Af Amer: >90 mL/min (ref >90)
GFR calc non Af Amer: 83 mL/min — ABNORMAL LOW (ref >90)
Glucose, Bld: 123 mg/dL — ABNORMAL HIGH (ref 70–99)
Potassium: 4.1 mEq/L (ref 3.7–5.3)
Sodium: 137 mEq/L (ref 137–147)
Total Bilirubin: 0.5 mg/dL (ref 0.3–1.2)
Total Protein: 5.9 g/dL — ABNORMAL LOW (ref 6.0–8.3)

## 2013-11-03 LAB — URINE CULTURE
Colony Count: NO GROWTH
Culture: NO GROWTH

## 2013-11-03 LAB — TSH: TSH: 1.94 u[IU]/mL (ref 0.350–4.500)

## 2013-11-03 LAB — CBC
HEMATOCRIT: 39.8 % (ref 39.0–52.0)
Hemoglobin: 13.5 g/dL (ref 13.0–17.0)
MCH: 30.2 pg (ref 26.0–34.0)
MCHC: 33.9 g/dL (ref 30.0–36.0)
MCV: 89 fL (ref 78.0–100.0)
Platelets: 230 10*3/uL (ref 150–400)
RBC: 4.47 MIL/uL (ref 4.22–5.81)
RDW: 14.2 % (ref 11.5–15.5)
WBC: 19.2 10*3/uL — AB (ref 4.0–10.5)

## 2013-11-03 LAB — PRO B NATRIURETIC PEPTIDE: Pro B Natriuretic peptide (BNP): 146.3 pg/mL (ref 0–450)

## 2013-11-03 MED ORDER — IMMUNE GLOBULIN (HUMAN) 10 GM/100ML IV SOLN
400.0000 mg/kg | INTRAVENOUS | Status: AC
  Administered 2013-11-03 – 2013-11-07 (×5): 45 g via INTRAVENOUS
  Filled 2013-11-03: qty 50
  Filled 2013-11-03 (×2): qty 400
  Filled 2013-11-03: qty 450
  Filled 2013-11-03: qty 400

## 2013-11-03 MED ORDER — CYANOCOBALAMIN 1000 MCG/ML IJ SOLN
1000.0000 ug | Freq: Once | INTRAMUSCULAR | Status: AC
  Administered 2013-11-03: 1000 ug via INTRAMUSCULAR
  Filled 2013-11-03: qty 1

## 2013-11-03 MED ORDER — PREDNISONE 10 MG PO TABS
10.0000 mg | ORAL_TABLET | Freq: Every day | ORAL | Status: DC
  Administered 2013-11-04 – 2013-11-07 (×4): 10 mg via ORAL
  Filled 2013-11-03 (×4): qty 1

## 2013-11-03 NOTE — Progress Notes (Signed)
  Echocardiogram 2D Echocardiogram has been performed.  Su Dylan Nelson 11/03/2013, 4:08 PM

## 2013-11-03 NOTE — Consult Note (Signed)
Dylan A. Dylan Laughter, MD     [email or web address]          Dylan Nelson is an 80 y.o. male.   ASSESSMENT/PLAN: 1. CIDP - Chronic inflammatory demyelinating polyneuropathy. The patient has failed steroid treatment and therefore immunoglobulins will be arranged. He will be placed on the standard 400 mg/kilogram per day for 5 days.  May consider cervical spine MRI and also antibodies for myasthenia gravis.  2. Vitamin B12 deficiency. Replacement will be continued.  3. Leg and ankle edema at baseline worsen with prednisone. Prednisone be weaned off.  4. Gait disorder due to the above.  5. Congenital left upper extremity weakness.   The patient is a 80 year old white male who presents with worsening gait impairment over the last several years. The patient has been wheelchair-bound for several years. He had been worked up extensively in the outpatient setting and the clinical picture seems most consistent with CIDP with areflexia especially of the legs. Patient did have a spinal tap is mostly unrevealing. The results are as follows: The BC 1, RBC 171, glucose 65 and protein 37, cryptococcal antigen and VDRL nonreactive. Other labs were significant for vitamin B12 deficiency for which she had been getting replacement. The patient was tried on high dose steroids reports that he felt weaker on the steroids. Additionally, he reports worsening swallowing of his baseline edema. He got to the point where he was noted to you in getting out of the car move around. He mostly gets around in a wheelchair but is able to get around with assistance. Because the worsening of weakness and edema he was admitted to the hospital for further evaluation. Review of systems otherwise negative.  GENERAL: Pleasant and in no acute distress.  HEENT: Supple. Atraumatic normocephalic.   ABDOMEN: soft  EXTREMITIES: Markedly 3+ pitting edema of the feet and ankles and distal legs.  BACK:  Normal.  SKIN: Normal by inspection.    MENTAL STATUS: Alert and oriented. Speech, language and cognition are generally intact. Judgment and insight normal.   CRANIAL NERVES: Pupils are equal, round and reactive to light and accommodation; extra ocular movements are full, there is no significant nystagmus; visual fields are full; upper and lower facial muscles are normal in strength and symmetric, there is no flattening of the nasolabial folds; tongue is midline; uvula is midline; shoulder elevation is normal.  MOTOR: Atrophy of the left upper extremity with associated weakness.Left deltoid 3/5 handgrip 4. Right upper extremity shows normal tone, bulk and strength. Hip flexion bilaterally 4 minus/5. Dorsiflexion 0/5.  COORDINATION: Left finger to nose is normal, right finger to nose is normal, No rest tremor; no intention tremor; no postural tremor; no bradykinesia.  REFLEXES: Deep tendon reflexes are symmetrical But markedly diminished throughout and essentially trace/absent in the legs. Babinski reflexes are flexor bilaterally.   SENSATION: Normal to light touch.     Past Medical History  Diagnosis Date  . Hypertension   . Neuropathy   . Prostate enlargement     History reviewed. No pertinent past surgical history.  History reviewed. No pertinent family history.  Social History:  reports that he has quit smoking. He does not have any smokeless tobacco history on file. He reports that he does not drink alcohol or use illicit drugs.  Allergies: No Known Allergies  Medications: Prior to Admission medications   Medication Sig Start Date End Date Taking? Authorizing Provider  doxazosin (CARDURA) 2 MG tablet Take 1 tablet by mouth daily. 09/04/13  Yes Historical Provider, MD  etodolac (LODINE) 400 MG tablet Take 1 tablet by mouth 2 (two) times daily. 08/27/13  Yes Historical Provider, MD  finasteride (PROSCAR) 5 MG tablet Take 1 tablet by mouth daily. 08/27/13  Yes Historical  Provider, MD  lisinopril (PRINIVIL,ZESTRIL) 10 MG tablet Take 1 tablet by mouth daily. 08/27/13  Yes Historical Provider, MD  omeprazole (PRILOSEC) 20 MG capsule Take 20 mg by mouth daily.   Yes Historical Provider, MD  potassium chloride SA (K-DUR,KLOR-CON) 20 MEQ tablet Take 20 mEq by mouth 3 (three) times daily.   Yes Historical Provider, MD  predniSONE (DELTASONE) 20 MG tablet Take 20 mg by mouth daily with breakfast.   Yes Historical Provider, MD  torsemide (DEMADEX) 20 MG tablet Take 1 tablet by mouth daily. 08/27/13  Yes Historical Provider, MD    Scheduled Meds: . furosemide  80 mg Intravenous BID  . heparin  5,000 Units Subcutaneous 3 times per day  . metolazone  2.5 mg Oral Daily  . pantoprazole  40 mg Oral Daily  . potassium chloride SA  20 mEq Oral TID  . predniSONE  20 mg Oral Q breakfast  . sodium chloride  3 mL Intravenous Q12H   Continuous Infusions:  PRN Meds:.ondansetron (ZOFRAN) IV, ondansetron   Blood pressure 94/58, pulse 90, temperature 97.9 F (36.6 C), temperature source Oral, resp. rate 16, height $RemoveBe'6\' 1"'dZOkAKgHS$  (1.854 m), weight 107.6 kg (237 lb 3.4 oz), SpO2 96.00%.   Results for orders placed during the hospital encounter of 11/02/13 (from the past 48 hour(s))  CBC WITH DIFFERENTIAL     Status: Abnormal   Collection Time    11/02/13 12:57 PM      Result Value Ref Range   WBC 22.4 (*) 4.0 - 10.5 K/uL   RBC 4.22  4.22 - 5.81 MIL/uL   Hemoglobin 12.7 (*) 13.0 - 17.0 g/dL   HCT 37.5 (*) 39.0 - 52.0 %   MCV 88.9  78.0 - 100.0 fL   MCH 30.1  26.0 - 34.0 pg   MCHC 33.9  30.0 - 36.0 g/dL   RDW 14.2  11.5 - 15.5 %   Platelets 230  150 - 400 K/uL   Neutrophils Relative % 90 (*) 43 - 77 %   Neutro Abs 20.2 (*) 1.7 - 7.7 K/uL   Lymphocytes Relative 4 (*) 12 - 46 %   Lymphs Abs 1.0  0.7 - 4.0 K/uL   Monocytes Relative 6  3 - 12 %   Monocytes Absolute 1.2 (*) 0.1 - 1.0 K/uL   Eosinophils Relative 0  0 - 5 %   Eosinophils Absolute 0.0  0.0 - 0.7 K/uL   Basophils  Relative 0  0 - 1 %   Basophils Absolute 0.0  0.0 - 0.1 K/uL   WBC Morphology MILD LEFT SHIFT (1-5% METAS, OCC MYELO, OCC BANDS)    BASIC METABOLIC PANEL     Status: Abnormal   Collection Time    11/02/13 12:57 PM      Result Value Ref Range   Sodium 135 (*) 137 - 147 mEq/L   Potassium 4.6  3.7 - 5.3 mEq/L   Chloride 98  96 - 112 mEq/L   CO2 26  19 - 32 mEq/L   Glucose, Bld 165 (*) 70 - 99 mg/dL   BUN 34 (*) 6 - 23 mg/dL   Creatinine, Ser 0.77  0.50 - 1.35 mg/dL   Calcium 9.2  8.4 - 10.5 mg/dL   GFR  calc non Af Amer 85 (*) >90 mL/min   GFR calc Af Amer >90  >90 mL/min   Comment: (NOTE)     The eGFR has been calculated using the CKD EPI equation.     This calculation has not been validated in all clinical situations.     eGFR's persistently <90 mL/min signify possible Chronic Kidney     Disease.  TROPONIN I     Status: None   Collection Time    11/02/13 12:57 PM      Result Value Ref Range   Troponin I <0.30  <0.30 ng/mL   Comment:            Due to the release kinetics of cTnI,     a negative result within the first hours     of the onset of symptoms does not rule out     myocardial infarction with certainty.     If myocardial infarction is still suspected,     repeat the test at appropriate intervals.  PRO B NATRIURETIC PEPTIDE     Status: None   Collection Time    11/02/13 12:57 PM      Result Value Ref Range   Pro B Natriuretic peptide (BNP) 139.9  0 - 450 pg/mL  HEPATIC FUNCTION PANEL     Status: Abnormal   Collection Time    11/02/13  1:05 PM      Result Value Ref Range   Total Protein 5.1 (*) 6.0 - 8.3 g/dL   Albumin 2.1 (*) 3.5 - 5.2 g/dL   AST 12  0 - 37 U/L   ALT 17  0 - 53 U/L   Alkaline Phosphatase 61  39 - 117 U/L   Total Bilirubin 0.7  0.3 - 1.2 mg/dL   Bilirubin, Direct 0.3  0.0 - 0.3 mg/dL   Indirect Bilirubin 0.4  0.3 - 0.9 mg/dL  MAGNESIUM     Status: None   Collection Time    11/02/13  1:05 PM      Result Value Ref Range   Magnesium 1.8  1.5 -  2.5 mg/dL  URINALYSIS, ROUTINE W REFLEX MICROSCOPIC     Status: Abnormal   Collection Time    11/02/13  2:30 PM      Result Value Ref Range   Color, Urine YELLOW  YELLOW   APPearance CLEAR  CLEAR   Specific Gravity, Urine 1.010  1.005 - 1.030   pH 6.0  5.0 - 8.0   Glucose, UA NEGATIVE  NEGATIVE mg/dL   Hgb urine dipstick NEGATIVE  NEGATIVE   Bilirubin Urine LARGE (*) NEGATIVE   Ketones, ur NEGATIVE  NEGATIVE mg/dL   Protein, ur NEGATIVE  NEGATIVE mg/dL   Urobilinogen, UA 0.2  0.0 - 1.0 mg/dL   Nitrite NEGATIVE  NEGATIVE   Leukocytes, UA NEGATIVE  NEGATIVE   Comment: MICROSCOPIC NOT DONE ON URINES WITH NEGATIVE PROTEIN, BLOOD, LEUKOCYTES, NITRITE, OR GLUCOSE <1000 mg/dL.  COMPREHENSIVE METABOLIC PANEL     Status: Abnormal   Collection Time    11/03/13  5:17 AM      Result Value Ref Range   Sodium 137  137 - 147 mEq/L   Potassium 4.1  3.7 - 5.3 mEq/L   Chloride 97  96 - 112 mEq/L   CO2 25  19 - 32 mEq/L   Glucose, Bld 123 (*) 70 - 99 mg/dL   BUN 30 (*) 6 - 23 mg/dL   Creatinine, Ser 0.82  0.50 -  1.35 mg/dL   Calcium 9.4  8.4 - 10.5 mg/dL   Total Protein 5.9 (*) 6.0 - 8.3 g/dL   Albumin 2.0 (*) 3.5 - 5.2 g/dL   AST 11  0 - 37 U/L   ALT 15  0 - 53 U/L   Alkaline Phosphatase 68  39 - 117 U/L   Total Bilirubin 0.5  0.3 - 1.2 mg/dL   GFR calc non Af Amer 83 (*) >90 mL/min   GFR calc Af Amer >90  >90 mL/min   Comment: (NOTE)     The eGFR has been calculated using the CKD EPI equation.     This calculation has not been validated in all clinical situations.     eGFR's persistently <90 mL/min signify possible Chronic Kidney     Disease.  CBC     Status: Abnormal   Collection Time    11/03/13  5:17 AM      Result Value Ref Range   WBC 19.2 (*) 4.0 - 10.5 K/uL   RBC 4.47  4.22 - 5.81 MIL/uL   Hemoglobin 13.5  13.0 - 17.0 g/dL   HCT 39.8  39.0 - 52.0 %   MCV 89.0  78.0 - 100.0 fL   MCH 30.2  26.0 - 34.0 pg   MCHC 33.9  30.0 - 36.0 g/dL   RDW 14.2  11.5 - 15.5 %   Platelets  230  150 - 400 K/uL  PRO B NATRIURETIC PEPTIDE     Status: None   Collection Time    11/03/13  5:17 AM      Result Value Ref Range   Pro B Natriuretic peptide (BNP) 146.3  0 - 450 pg/mL    Dg Chest Portable 1 View  11/02/2013   CLINICAL DATA:  Leg and feet edema for more than 1 week.  EXAM: PORTABLE CHEST - 1 VIEW  COMPARISON:  02/24/2004 radiographs.  FINDINGS: The heart size is at the upper limits of normal for AP technique. The lungs are clear. There is no pleural effusion or pneumothorax. No acute osseous findings are demonstrated. Telemetry leads overlie the chest.  IMPRESSION: Stable chest.  No acute cardiopulmonary process.   Electronically Signed   By: Camie Patience M.D.   On: 11/02/2013 12:55        Lilla Callejo A. Dylan Nelson, M.D.  Diplomate, [employer] of Psychiatry and Neurology ( Neurology). 11/03/2013, 9:15 AM      1

## 2013-11-03 NOTE — Care Management Utilization Note (Signed)
UR completed 

## 2013-11-03 NOTE — Progress Notes (Signed)
Subjective: He was admitted yesterday with significant swelling. He has known polyneuropathy for several years. He had recently been to see the neurologist and was started on prednisone for the polyneuropathy but this has caused him to have severe swelling of his legs. He had some problem with edema even without the prednisone. He has a history of what is presumed to be prostate cancer he is on no treatment.  Objective: Vital signs in last 24 hours: Temp:  [97.5 F (36.4 C)-98.6 F (37 C)] 97.9 F (36.6 C) (05/19 0456) Pulse Rate:  [88-105] 90 (05/19 0456) Resp:  [14-22] 16 (05/19 0456) BP: (83-148)/(47-121) 94/58 mmHg (05/19 0456) SpO2:  [93 %-100 %] 96 % (05/19 0456) Weight:  [107.1 kg (236 lb 1.8 oz)-111.131 kg (245 lb)] 107.6 kg (237 lb 3.4 oz) (05/19 0456) Weight change:  Last BM Date: 11/02/13  Intake/Output from previous day: 05/18 0701 - 05/19 0700 In: -  Out: 3250 [Urine:3250]  PHYSICAL EXAM General appearance: alert, cooperative, mild distress and morbidly obese Resp: clear to auscultation bilaterally Cardio: regular rate and rhythm, S1, S2 normal, no murmur, click, rub or gallop GI: soft, non-tender; bowel sounds normal; no masses,  no organomegaly Extremities: 3+ edema of both legs  Lab Results:    Basic Metabolic Panel:  Recent Labs  11/02/13 1257 11/02/13 1305 11/03/13 0517  NA 135*  --  137  K 4.6  --  4.1  CL 98  --  97  CO2 26  --  25  GLUCOSE 165*  --  123*  BUN 34*  --  30*  CREATININE 0.77  --  0.82  CALCIUM 9.2  --  9.4  MG  --  1.8  --    Liver Function Tests:  Recent Labs  11/02/13 1305 11/03/13 0517  AST 12 11  ALT 17 15  ALKPHOS 61 68  BILITOT 0.7 0.5  PROT 5.1* 5.9*  ALBUMIN 2.1* 2.0*   No results found for this basename: LIPASE, AMYLASE,  in the last 72 hours No results found for this basename: AMMONIA,  in the last 72 hours CBC:  Recent Labs  11/02/13 1257 11/03/13 0517  WBC 22.4* 19.2*  NEUTROABS 20.2*  --   HGB  12.7* 13.5  HCT 37.5* 39.8  MCV 88.9 89.0  PLT 230 230   Cardiac Enzymes:  Recent Labs  11/02/13 1257  TROPONINI <0.30   BNP:  Recent Labs  11/02/13 1257 11/03/13 0517  PROBNP 139.9 146.3   D-Dimer: No results found for this basename: DDIMER,  in the last 72 hours CBG: No results found for this basename: GLUCAP,  in the last 72 hours Hemoglobin A1C: No results found for this basename: HGBA1C,  in the last 72 hours Fasting Lipid Panel: No results found for this basename: CHOL, HDL, LDLCALC, TRIG, CHOLHDL, LDLDIRECT,  in the last 72 hours Thyroid Function Tests: No results found for this basename: TSH, T4TOTAL, FREET4, T3FREE, THYROIDAB,  in the last 72 hours Anemia Panel: No results found for this basename: VITAMINB12, FOLATE, FERRITIN, TIBC, IRON, RETICCTPCT,  in the last 72 hours Coagulation: No results found for this basename: LABPROT, INR,  in the last 72 hours Urine Drug Screen: Drugs of Abuse  No results found for this basename: labopia, cocainscrnur, labbenz, amphetmu, thcu, labbarb    Alcohol Level: No results found for this basename: ETH,  in the last 72 hours Urinalysis:  Recent Labs  11/02/13 1430  COLORURINE YELLOW  LABSPEC 1.010  PHURINE 6.0  GLUCOSEU NEGATIVE  HGBUR NEGATIVE  BILIRUBINUR LARGE*  KETONESUR NEGATIVE  PROTEINUR NEGATIVE  UROBILINOGEN 0.2  NITRITE NEGATIVE  LEUKOCYTESUR NEGATIVE   Misc. Labs:  ABGS No results found for this basename: PHART, PCO2, PO2ART, TCO2, HCO3,  in the last 72 hours CULTURES No results found for this or any previous visit (from the past 240 hour(s)). Studies/Results: Dg Chest Portable 1 View  11/02/2013   CLINICAL DATA:  Leg and feet edema for more than 1 week.  EXAM: PORTABLE CHEST - 1 VIEW  COMPARISON:  02/24/2004 radiographs.  FINDINGS: The heart size is at the upper limits of normal for AP technique. The lungs are clear. There is no pleural effusion or pneumothorax. No acute osseous findings are  demonstrated. Telemetry leads overlie the chest.  IMPRESSION: Stable chest.  No acute cardiopulmonary process.   Electronically Signed   By: Camie Patience M.D.   On: 11/02/2013 12:55    Medications:  Prior to Admission:  Prescriptions prior to admission  Medication Sig Dispense Refill  . doxazosin (CARDURA) 2 MG tablet Take 1 tablet by mouth daily.      Marland Kitchen etodolac (LODINE) 400 MG tablet Take 1 tablet by mouth 2 (two) times daily.      . finasteride (PROSCAR) 5 MG tablet Take 1 tablet by mouth daily.      Marland Kitchen lisinopril (PRINIVIL,ZESTRIL) 10 MG tablet Take 1 tablet by mouth daily.      Marland Kitchen omeprazole (PRILOSEC) 20 MG capsule Take 20 mg by mouth daily.      . potassium chloride SA (K-DUR,KLOR-CON) 20 MEQ tablet Take 20 mEq by mouth 3 (three) times daily.      . predniSONE (DELTASONE) 20 MG tablet Take 20 mg by mouth daily with breakfast.      . torsemide (DEMADEX) 20 MG tablet Take 1 tablet by mouth daily.       Scheduled: . furosemide  80 mg Intravenous BID  . heparin  5,000 Units Subcutaneous 3 times per day  . metolazone  2.5 mg Oral Daily  . pantoprazole  40 mg Oral Daily  . potassium chloride SA  20 mEq Oral TID  . predniSONE  20 mg Oral Q breakfast  . sodium chloride  3 mL Intravenous Q12H   Continuous:  [id number] (ZOFRAN) IV, ondansetron  Assesment: He has bilateral leg edema probably mostly from prednisone. He is known to have had edema prior to starting prednisone but this is much worse. He has hypertension which is pretty well controlled. He has an inflammatory polyneuropathy which has been treated with prednisone Active Problems:   Bilateral leg edema   HTN (hypertension)   Neuropathy    Plan: Continue with IV diuretics. He will have venous study of his legs. He will have neurology consultation to see if there something else we can do.    LOS: 1 day   Alonza Bogus 11/03/2013, 8:54 AM

## 2013-11-03 NOTE — Care Management Note (Addendum)
    Page 1 of 1   11/06/2013     2:07:50 PM CARE MANAGEMENT NOTE 11/06/2013  Patient:  Dylan Nelson, Dylan Nelson   Account Number:  [id number]  Date Initiated:  11/03/2013  Documentation initiated by:  Theophilus Kinds  Subjective/Objective Assessment:   Pt admitted from home with lower extremity edema. Pt lives with his wife and will return home at discharge. Pt has a BSC, slide board, w/c, scooter, rollator, walker, and handicap vehicle.     Action/Plan:   Will continue to follow for discharge planning needs. Pt will need 5 days worth of IV IG.   Anticipated DC Date:  11/07/2013   Anticipated DC Plan:  [place]  CM consult      Choice offered to / List presented to:             Status of service:  Completed, signed off Medicare Important Message given?  YES (If response is "NO", the following Medicare IM given date fields will be blank) Date Medicare IM given:  11/06/2013 Date Additional Medicare IM given:    Discharge Disposition:  [place]  Per UR Regulation:    If discussed at Long Length of Stay Meetings, dates discussed:    Comments:  11/06/13 [place], RN BSN CM Pt to be discharged to SNF at 11/07/13. CSW to arrange discharge to facility.  11/03/13 [place], RN BSN CM

## 2013-11-04 LAB — BASIC METABOLIC PANEL
BUN: 24 mg/dL — ABNORMAL HIGH (ref 6–23)
CO2: 30 mEq/L (ref 19–32)
Calcium: 9.3 mg/dL (ref 8.4–10.5)
Chloride: 91 mEq/L — ABNORMAL LOW (ref 96–112)
Creatinine, Ser: 0.68 mg/dL (ref 0.50–1.35)
GFR calc Af Amer: >90 mL/min (ref >90)
GFR, EST NON AFRICAN AMERICAN: 89 mL/min — AB (ref >90)
Glucose, Bld: 180 mg/dL — ABNORMAL HIGH (ref 70–99)
POTASSIUM: 3.7 meq/L (ref 3.7–5.3)
SODIUM: 133 meq/L — AB (ref 137–147)

## 2013-11-04 NOTE — Progress Notes (Signed)
Patient ID: Dylan Nelson, male   DOB: [birth year], 80 y.o.   MRN: [id number]  Pepin A. Merlene Laughter, MD     [email or web address]          Dylan Nelson is an 80 y.o. male.   Assessment/Plan: 1. CIDP - Chronic inflammatory demyelinating polyneuropathy. The patient has failed steroid treatment and therefore immunoglobulins will be arranged. He will be placed on the standard 400 mg/kilogram per day for 5 days. May consider cervical spine MRI and also antibodies for myasthenia gravis.  2. Vitamin B12 deficiency. Replacement will be continued.  3. Leg and ankle edema at baseline worsen with prednisone. Prednisone be weaned off.  4. Gait disorder due to the above.  5. Congenital left upper extremity weakness.  6. 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 This be observed for now.  The patient reports that he's overall doing about the same. He has gotten a dose of IV immunoglobulins and has tolerated this.   GENERAL: Pleasant and in no acute distress.  HEENT: Supple. Atraumatic normocephalic.  ABDOMEN: soft  EXTREMITIES: Markedly 3+ pitting edema of the feet and ankles and distal legs.  BACK: Normal.  SKIN: Normal by inspection.  MENTAL STATUS: Alert and oriented. Speech, language and cognition are generally intact. Judgment and insight normal.  CRANIAL NERVES: Pupils are equal, round and reactive to light and accommodation; extra ocular movements are full, there is no significant nystagmus; visual fields are full; upper and lower facial muscles are normal in strength and symmetric, there is no flattening of the nasolabial folds; tongue is midline; uvula is midline; shoulder elevation is normal.  MOTOR: Atrophy of the left upper extremity with associated weakness.Left deltoid 3/5 handgrip 4. Right upper extremity shows normal tone, bulk and strength. Hip flexion bilaterally 3/5. Dorsiflexion 0/5.  COORDINATION: Left finger to nose is normal, right  finger to nose is normal, No rest tremor; no intention tremor; no postural tremor; no bradykinesia.  REFLEXES: Deep tendon reflexes are symmetrical But markedly diminished throughout and essentially trace/absent in the legs. Babinski reflexes are flexor bilaterally.  SENSATION: Normal to light touch.        Objective: Vital signs in last 24 hours: Temp:  [97.4 F (36.3 C)-98.6 F (37 C)] 98.1 F (36.7 C) (05/20 0417) Pulse Rate:  [87-110] 87 (05/20 0417) Resp:  [16-18] 18 (05/20 0417) BP: (92-116)/(52-67) 97/65 mmHg (05/20 0417) SpO2:  [94 %-98 %] 94 % (05/20 0417) Weight:  [106.6 kg (235 lb 0.2 oz)] 106.6 kg (235 lb 0.2 oz) (05/20 0417)  Intake/Output from previous day: 05/19 0701 - 05/20 0700 In: 240 [P.O.:240] Out: 4800 [Urine:4800] Intake/Output this shift: Total I/O In: 450 [I.V.:450] Out: -  Nutritional status: Cardiac   Lab Results: Results for orders placed during the hospital encounter of 11/02/13 (from the past 48 hour(s))  CBC WITH DIFFERENTIAL     Status: Abnormal   Collection Time    11/02/13 12:57 PM      Result Value Ref Range   WBC 22.4 (*) 4.0 - 10.5 K/uL   RBC 4.22  4.22 - 5.81 MIL/uL   Hemoglobin 12.7 (*) 13.0 - 17.0 g/dL   HCT 37.5 (*) 39.0 - 52.0 %   MCV 88.9  78.0 - 100.0 fL   MCH 30.1  26.0 - 34.0 pg   MCHC 33.9  30.0 - 36.0 g/dL   RDW 14.2  11.5 - 15.5 %   Platelets 230  150 - 400 K/uL  Neutrophils Relative % 90 (*) 43 - 77 %   Neutro Abs 20.2 (*) 1.7 - 7.7 K/uL   Lymphocytes Relative 4 (*) 12 - 46 %   Lymphs Abs 1.0  0.7 - 4.0 K/uL   Monocytes Relative 6  3 - 12 %   Monocytes Absolute 1.2 (*) 0.1 - 1.0 K/uL   Eosinophils Relative 0  0 - 5 %   Eosinophils Absolute 0.0  0.0 - 0.7 K/uL   Basophils Relative 0  0 - 1 %   Basophils Absolute 0.0  0.0 - 0.1 K/uL   WBC Morphology MILD LEFT SHIFT (1-5% METAS, OCC MYELO, OCC BANDS)    BASIC METABOLIC PANEL     Status: Abnormal   Collection Time    11/02/13 12:57 PM      Result Value Ref  Range   Sodium 135 (*) 137 - 147 mEq/L   Potassium 4.6  3.7 - 5.3 mEq/L   Chloride 98  96 - 112 mEq/L   CO2 26  19 - 32 mEq/L   Glucose, Bld 165 (*) 70 - 99 mg/dL   BUN 34 (*) 6 - 23 mg/dL   Creatinine, Ser 0.77  0.50 - 1.35 mg/dL   Calcium 9.2  8.4 - 10.5 mg/dL   GFR calc non Af Amer 85 (*) >90 mL/min   GFR calc Af Amer >90  >90 mL/min   Comment: (NOTE)     The eGFR has been calculated using the CKD EPI equation.     This calculation has not been validated in all clinical situations.     eGFR's persistently <90 mL/min signify possible Chronic Kidney     Disease.  TROPONIN I     Status: None   Collection Time    11/02/13 12:57 PM      Result Value Ref Range   Troponin I <0.30  <0.30 ng/mL   Comment:            Due to the release kinetics of cTnI,     a negative result within the first hours     of the onset of symptoms does not rule out     myocardial infarction with certainty.     If myocardial infarction is still suspected,     repeat the test at appropriate intervals.  PRO B NATRIURETIC PEPTIDE     Status: None   Collection Time    11/02/13 12:57 PM      Result Value Ref Range   Pro B Natriuretic peptide (BNP) 139.9  0 - 450 pg/mL  HEPATIC FUNCTION PANEL     Status: Abnormal   Collection Time    11/02/13  1:05 PM      Result Value Ref Range   Total Protein 5.1 (*) 6.0 - 8.3 g/dL   Albumin 2.1 (*) 3.5 - 5.2 g/dL   AST 12  0 - 37 U/L   ALT 17  0 - 53 U/L   Alkaline Phosphatase 61  39 - 117 U/L   Total Bilirubin 0.7  0.3 - 1.2 mg/dL   Bilirubin, Direct 0.3  0.0 - 0.3 mg/dL   Indirect Bilirubin 0.4  0.3 - 0.9 mg/dL  MAGNESIUM     Status: None   Collection Time    11/02/13  1:05 PM      Result Value Ref Range   Magnesium 1.8  1.5 - 2.5 mg/dL  URINALYSIS, ROUTINE W REFLEX MICROSCOPIC     Status: Abnormal   Collection Time  11/02/13  2:30 PM      Result Value Ref Range   Color, Urine YELLOW  YELLOW   APPearance CLEAR  CLEAR   Specific Gravity, Urine 1.010  1.005  - 1.030   pH 6.0  5.0 - 8.0   Glucose, UA NEGATIVE  NEGATIVE mg/dL   Hgb urine dipstick NEGATIVE  NEGATIVE   Bilirubin Urine LARGE (*) NEGATIVE   Ketones, ur NEGATIVE  NEGATIVE mg/dL   Protein, ur NEGATIVE  NEGATIVE mg/dL   Urobilinogen, UA 0.2  0.0 - 1.0 mg/dL   Nitrite NEGATIVE  NEGATIVE   Leukocytes, UA NEGATIVE  NEGATIVE   Comment: MICROSCOPIC NOT DONE ON URINES WITH NEGATIVE PROTEIN, BLOOD, LEUKOCYTES, NITRITE, OR GLUCOSE <1000 mg/dL.  URINE CULTURE     Status: None   Collection Time    11/02/13  2:30 PM      Result Value Ref Range   Specimen Description URINE, CATHETERIZED     Special Requests NONE     Culture  Setup Time       Value: 11/02/2013 23:00     Performed at [facility]       Value: NO GROWTH     Performed at [facility]   Culture       Value: NO GROWTH     Performed at [facility]   Report Status 11/03/2013 FINAL    COMPREHENSIVE METABOLIC PANEL     Status: Abnormal   Collection Time    11/03/13  5:17 AM      Result Value Ref Range   Sodium 137  137 - 147 mEq/L   Potassium 4.1  3.7 - 5.3 mEq/L   Chloride 97  96 - 112 mEq/L   CO2 25  19 - 32 mEq/L   Glucose, Bld 123 (*) 70 - 99 mg/dL   BUN 30 (*) 6 - 23 mg/dL   Creatinine, Ser 0.82  0.50 - 1.35 mg/dL   Calcium 9.4  8.4 - 10.5 mg/dL   Total Protein 5.9 (*) 6.0 - 8.3 g/dL   Albumin 2.0 (*) 3.5 - 5.2 g/dL   AST 11  0 - 37 U/L   ALT 15  0 - 53 U/L   Alkaline Phosphatase 68  39 - 117 U/L   Total Bilirubin 0.5  0.3 - 1.2 mg/dL   GFR calc non Af Amer 83 (*) >90 mL/min   GFR calc Af Amer >90  >90 mL/min   Comment: (NOTE)     The eGFR has been calculated using the CKD EPI equation.     This calculation has not been validated in all clinical situations.     eGFR's persistently <90 mL/min signify possible Chronic Kidney     Disease.  CBC     Status: Abnormal   Collection Time    11/03/13  5:17 AM      Result Value Ref Range   WBC 19.2 (*) 4.0 - 10.5 K/uL   RBC  4.47  4.22 - 5.81 MIL/uL   Hemoglobin 13.5  13.0 - 17.0 g/dL   HCT 39.8  39.0 - 52.0 %   MCV 89.0  78.0 - 100.0 fL   MCH 30.2  26.0 - 34.0 pg   MCHC 33.9  30.0 - 36.0 g/dL   RDW 14.2  11.5 - 15.5 %   Platelets 230  150 - 400 K/uL  PRO B NATRIURETIC PEPTIDE     Status: None   Collection Time  11/03/13  5:17 AM      Result Value Ref Range   Pro B Natriuretic peptide (BNP) 146.3  0 - 450 pg/mL  TSH     Status: None   Collection Time    11/03/13  5:17 AM      Result Value Ref Range   TSH 1.940  0.350 - 4.500 uIU/mL   Comment: Please note change in reference range.     Performed at [facility]    Lipid Panel No results found for this basename: CHOL, TRIG, HDL, CHOLHDL, VLDL, LDLCALC,  in the last 72 hours  Studies/Results: Mr Cervical Spine Wo Contrast  11/03/2013   CLINICAL DATA:  Neck pain radiating into both arms. No known injury.  EXAM: MRI CERVICAL SPINE WITHOUT CONTRAST  TECHNIQUE: Multiplanar, multisequence MR imaging of the cervical spine was performed. No intravenous contrast was administered.  COMPARISON:  Cervical spine radiographs 07/13/2013.  FINDINGS: The alignment is grossly stable with reversal of the usual cervical lordosis, a grade 1 anterolisthesis at C4-5 and a grade 1 retrolisthesis at C5-6 and C6-7. There is multilevel spondylosis with endplate degeneration and paraspinal osteophytes. There is no evidence of acute fracture or paraspinal abnormality.  The craniocervical junction appears normal. The cervical cord is normal in signal and caliber. There are bilateral vertebral artery flow voids.  C2-3: There is moderate asymmetric facet hypertrophy on the left contributing to mild left foraminal stenosis. No cord deformity.  C3-4: Moderate asymmetric uncinate spurring and facet hypertrophy on the right contribute to moderate right foraminal stenosis. No cord deformity.  C4-5: Chronic spondylosis with loss of disc height, posterior osteophytes and asymmetric uncinate  spurring and facet hypertrophy on the right. The CSF surrounding the cord is effaced. There is severe right and moderate left foraminal stenosis.  C5-6: There is chronic spondylosis with advanced loss of disc height, posterior osteophytes and bilateral uncinate spurring. The CSF surrounding the cord is effaced. Moderate foraminal narrowing is present bilaterally.  C6-7: There is chronic spondylosis with advanced loss of disc height, posterior osteophytes and bilateral uncinate spurring. The CSF surrounding the cord is effaced. Moderate foraminal narrowing is present bilaterally.  C7-T1: Grade 1 anterolisthesis with asymmetric facet hypertrophy on the left. There is mild left foraminal stenosis. No cord deformity.  IMPRESSION: 1. Multilevel cervical spondylosis as described. From C4-5 through C6-7, the CSF surrounding the cord is effaced without cord deformity or myelopathic changes. 2. Posterior osteophytes and uncinate spurring contribute to at least moderate foraminal narrowing at multiple levels as detailed above. This may contribute to exiting nerve root encroachment and upper extremity radicular symptoms. 3. No acute findings suspected.   Electronically Signed   By: Camie Patience M.D.   On: 11/03/2013 12:13   US Venous Img Lower Bilateral  11/03/2013   CLINICAL DATA:  Edema.  EXAM: BILATERAL LOWER EXTREMITY VENOUS DOPPLER ULTRASOUND  TECHNIQUE: Gray-scale sonography with graded compression, as well as color Doppler and duplex ultrasound were performed to evaluate the lower extremity deep venous systems from the level of the common femoral vein and including the common femoral, femoral, profunda femoral, popliteal and calf veins including the posterior tibial, peroneal and gastrocnemius veins when visible. The superficial great saphenous vein was also interrogated. Spectral Doppler was utilized to evaluate flow at rest and with distal augmentation maneuvers in the common femoral, femoral and popliteal veins.   COMPARISON:  None.  FINDINGS: RIGHT LOWER EXTREMITY  Common Femoral Vein: No evidence of thrombus. Normal compressibility, respiratory phasicity and  response to augmentation.  Saphenofemoral Junction: No evidence of thrombus. Normal compressibility and flow on color Doppler imaging.  Profunda Femoral Vein: No evidence of thrombus. Normal compressibility and flow on color Doppler imaging.  Femoral Vein: No evidence of thrombus. Normal compressibility, respiratory phasicity and response to augmentation.  Popliteal Vein: No evidence of thrombus. Normal compressibility, respiratory phasicity and response to augmentation.  Calf Veins: No evidence of thrombus. Normal compressibility and flow on color Doppler imaging.  Superficial Great Saphenous Vein: No evidence of thrombus. Normal compressibility and flow on color Doppler imaging.  Venous Reflux:  None.  Other Findings:  None.  LEFT LOWER EXTREMITY  Common Femoral Vein: No evidence of thrombus. Normal compressibility, respiratory phasicity and response to augmentation.  Saphenofemoral Junction: No evidence of thrombus. Normal compressibility and flow on color Doppler imaging.  Profunda Femoral Vein: No evidence of thrombus. Normal compressibility and flow on color Doppler imaging.  Femoral Vein: No evidence of thrombus. Normal compressibility, respiratory phasicity and response to augmentation.  Popliteal Vein: No evidence of thrombus. Normal compressibility, respiratory phasicity and response to augmentation.  Calf Veins: No evidence of thrombus. Normal compressibility and flow on color Doppler imaging.  Superficial Great Saphenous Vein: No evidence of thrombus. Normal compressibility and flow on color Doppler imaging.  Venous Reflux:  None.  Other Findings:  None.  IMPRESSION: No evidence of deep venous thrombosis.   Electronically Signed   By: [place]   On: 11/03/2013 11:10   Dg Chest Portable 1 View  11/02/2013   CLINICAL DATA:  Leg and feet edema for  more than 1 week.  EXAM: PORTABLE CHEST - 1 VIEW  COMPARISON:  02/24/2004 radiographs.  FINDINGS: The heart size is at the upper limits of normal for AP technique. The lungs are clear. There is no pleural effusion or pneumothorax. No acute osseous findings are demonstrated. Telemetry leads overlie the chest.  IMPRESSION: Stable chest.  No acute cardiopulmonary process.   Electronically Signed   By: Camie Patience M.D.   On: 11/02/2013 12:55   The cervical spine MRI is reviewed in person and shows multilevel disc osteophyte complex most severe at C4-C5 causing near complete obliteration of the CSF. No clear evidence of compression however or of myopathy.   Medications:  Scheduled Meds: . furosemide  80 mg Intravenous BID  . heparin  5,000 Units Subcutaneous 3 times per day  . IMMUNE GLOBULIN 10% (HUMAN) IV - For Fluid Restriction Only  400 mg/kg Intravenous Q24H  . metolazone  2.5 mg Oral Daily  . pantoprazole  40 mg Oral Daily  . potassium chloride SA  20 mEq Oral TID  . predniSONE  10 mg Oral Q breakfast  . sodium chloride  3 mL Intravenous Q12H   Continuous Infusions:  PRN Meds:.ondansetron (ZOFRAN) IV, ondansetron     LOS: 2 days   Shanzay Hepworth A. Merlene Laughter, M.D.  Diplomate, Tax adviser of Psychiatry and Neurology ( Neurology).

## 2013-11-04 NOTE — Progress Notes (Signed)
Subjective: He says he feels somewhat better. He has pain in his left leg. His edema is much improved.  Objective: Vital signs in last 24 hours: Temp:  [97.4 F (36.3 C)-98.6 F (37 C)] 98.1 F (36.7 C) (05/20 0417) Pulse Rate:  [87-110] 87 (05/20 0417) Resp:  [16-18] 18 (05/20 0417) BP: (92-116)/(52-67) 97/65 mmHg (05/20 0417) SpO2:  [94 %-98 %] 94 % (05/20 0417) Weight:  [106.6 kg (235 lb 0.2 oz)] 106.6 kg (235 lb 0.2 oz) (05/20 0417) Weight change: -4.531 kg (-9 lb 15.8 oz) Last BM Date: 11/02/13  Intake/Output from previous day: 05/19 0701 - 05/20 0700 In: 240 [P.O.:240] Out: 4800 [Urine:4800]  PHYSICAL EXAM General appearance: alert, cooperative and mild distress Resp: clear to auscultation bilaterally Cardio: regular rate and rhythm, S1, S2 normal, no murmur, click, rub or gallop GI: soft, non-tender; bowel sounds normal; no masses,  no organomegaly Extremities: edema Much improved  Lab Results:    Basic Metabolic Panel:  Recent Labs  11/02/13 1257 11/02/13 1305 11/03/13 0517  NA 135*  --  137  K 4.6  --  4.1  CL 98  --  97  CO2 26  --  25  GLUCOSE 165*  --  123*  BUN 34*  --  30*  CREATININE 0.77  --  0.82  CALCIUM 9.2  --  9.4  MG  --  1.8  --    Liver Function Tests:  Recent Labs  11/02/13 1305 11/03/13 0517  AST 12 11  ALT 17 15  ALKPHOS 61 68  BILITOT 0.7 0.5  PROT 5.1* 5.9*  ALBUMIN 2.1* 2.0*   No results found for this basename: LIPASE, AMYLASE,  in the last 72 hours No results found for this basename: AMMONIA,  in the last 72 hours CBC:  Recent Labs  11/02/13 1257 11/03/13 0517  WBC 22.4* 19.2*  NEUTROABS 20.2*  --   HGB 12.7* 13.5  HCT 37.5* 39.8  MCV 88.9 89.0  PLT 230 230   Cardiac Enzymes:  Recent Labs  11/02/13 1257  TROPONINI <0.30   BNP:  Recent Labs  11/02/13 1257 11/03/13 0517  PROBNP 139.9 146.3   D-Dimer: No results found for this basename: DDIMER,  in the last 72 hours CBG: No results found for  this basename: GLUCAP,  in the last 72 hours Hemoglobin A1C: No results found for this basename: HGBA1C,  in the last 72 hours Fasting Lipid Panel: No results found for this basename: CHOL, HDL, LDLCALC, TRIG, CHOLHDL, LDLDIRECT,  in the last 72 hours Thyroid Function Tests:  Recent Labs  11/03/13 0517  TSH 1.940   Anemia Panel: No results found for this basename: VITAMINB12, FOLATE, FERRITIN, TIBC, IRON, RETICCTPCT,  in the last 72 hours Coagulation: No results found for this basename: LABPROT, INR,  in the last 72 hours Urine Drug Screen: Drugs of Abuse  No results found for this basename: labopia, cocainscrnur, labbenz, amphetmu, thcu, labbarb    Alcohol Level: No results found for this basename: ETH,  in the last 72 hours Urinalysis:  Recent Labs  11/02/13 1430  COLORURINE YELLOW  LABSPEC 1.010  PHURINE 6.0  GLUCOSEU NEGATIVE  HGBUR NEGATIVE  BILIRUBINUR LARGE*  KETONESUR NEGATIVE  PROTEINUR NEGATIVE  UROBILINOGEN 0.2  NITRITE NEGATIVE  [place]. Labs:  ABGS No results found for this basename: PHART, PCO2, PO2ART, TCO2, HCO3,  in the last 72 hours CULTURES Recent Results (from the past 240 hour(s))  URINE CULTURE  Status: None   Collection Time    11/02/13  2:30 PM      Result Value Ref Range Status   Specimen Description URINE, CATHETERIZED   Final   Special Requests NONE   Final   Culture  Setup Time     Final   Value: 11/02/2013 23:00     Performed at [facility]     Final   Value: NO GROWTH     Performed at [facility]   Culture     Final   Value: NO GROWTH     Performed at [facility]   Report Status 11/03/2013 FINAL   Final   Studies/Results: Mr Cervical Spine Wo Contrast  11/03/2013   CLINICAL DATA:  Neck pain radiating into both arms. No known injury.  EXAM: MRI CERVICAL SPINE WITHOUT CONTRAST  TECHNIQUE: Multiplanar, multisequence MR imaging of the cervical spine was  performed. No intravenous contrast was administered.  COMPARISON:  Cervical spine radiographs 07/13/2013.  FINDINGS: The alignment is grossly stable with reversal of the usual cervical lordosis, a grade 1 anterolisthesis at C4-5 and a grade 1 retrolisthesis at C5-6 and C6-7. There is multilevel spondylosis with endplate degeneration and paraspinal osteophytes. There is no evidence of acute fracture or paraspinal abnormality.  The craniocervical junction appears normal. The cervical cord is normal in signal and caliber. There are bilateral vertebral artery flow voids.  C2-3: There is moderate asymmetric facet hypertrophy on the left contributing to mild left foraminal stenosis. No cord deformity.  C3-4: Moderate asymmetric uncinate spurring and facet hypertrophy on the right contribute to moderate right foraminal stenosis. No cord deformity.  C4-5: Chronic spondylosis with loss of disc height, posterior osteophytes and asymmetric uncinate spurring and facet hypertrophy on the right. The CSF surrounding the cord is effaced. There is severe right and moderate left foraminal stenosis.  C5-6: There is chronic spondylosis with advanced loss of disc height, posterior osteophytes and bilateral uncinate spurring. The CSF surrounding the cord is effaced. Moderate foraminal narrowing is present bilaterally.  C6-7: There is chronic spondylosis with advanced loss of disc height, posterior osteophytes and bilateral uncinate spurring. The CSF surrounding the cord is effaced. Moderate foraminal narrowing is present bilaterally.  C7-T1: Grade 1 anterolisthesis with asymmetric facet hypertrophy on the left. There is mild left foraminal stenosis. No cord deformity.  IMPRESSION: 1. Multilevel cervical spondylosis as described. From C4-5 through C6-7, the CSF surrounding the cord is effaced without cord deformity or myelopathic changes. 2. Posterior osteophytes and uncinate spurring contribute to at least moderate foraminal narrowing at  multiple levels as detailed above. This may contribute to exiting nerve root encroachment and upper extremity radicular symptoms. 3. No acute findings suspected.   Electronically Signed   By: Camie Patience M.D.   On: 11/03/2013 12:13   US Venous Img Lower Bilateral  11/03/2013   CLINICAL DATA:  Edema.  EXAM: BILATERAL LOWER EXTREMITY VENOUS DOPPLER ULTRASOUND  TECHNIQUE: Gray-scale sonography with graded compression, as well as color Doppler and duplex ultrasound were performed to evaluate the lower extremity deep venous systems from the level of the common femoral vein and including the common femoral, femoral, profunda femoral, popliteal and calf veins including the posterior tibial, peroneal and gastrocnemius veins when visible. The superficial great saphenous vein was also interrogated. Spectral Doppler was utilized to evaluate flow at rest and with distal augmentation maneuvers in the common femoral, femoral and popliteal veins.  COMPARISON:  None.  FINDINGS:  RIGHT LOWER EXTREMITY  Common Femoral Vein: No evidence of thrombus. Normal compressibility, respiratory phasicity and response to augmentation.  Saphenofemoral Junction: No evidence of thrombus. Normal compressibility and flow on color Doppler imaging.  Profunda Femoral Vein: No evidence of thrombus. Normal compressibility and flow on color Doppler imaging.  Femoral Vein: No evidence of thrombus. Normal compressibility, respiratory phasicity and response to augmentation.  Popliteal Vein: No evidence of thrombus. Normal compressibility, respiratory phasicity and response to augmentation.  Calf Veins: No evidence of thrombus. Normal compressibility and flow on color Doppler imaging.  Superficial Great Saphenous Vein: No evidence of thrombus. Normal compressibility and flow on color Doppler imaging.  Venous Reflux:  None.  Other Findings:  None.  LEFT LOWER EXTREMITY  Common Femoral Vein: No evidence of thrombus. Normal compressibility, respiratory  phasicity and response to augmentation.  Saphenofemoral Junction: No evidence of thrombus. Normal compressibility and flow on color Doppler imaging.  Profunda Femoral Vein: No evidence of thrombus. Normal compressibility and flow on color Doppler imaging.  Femoral Vein: No evidence of thrombus. Normal compressibility, respiratory phasicity and response to augmentation.  Popliteal Vein: No evidence of thrombus. Normal compressibility, respiratory phasicity and response to augmentation.  Calf Veins: No evidence of thrombus. Normal compressibility and flow on color Doppler imaging.  Superficial Great Saphenous Vein: No evidence of thrombus. Normal compressibility and flow on color Doppler imaging.  Venous Reflux:  None.  Other Findings:  None.  IMPRESSION: No evidence of deep venous thrombosis.   Electronically Signed   By: [place]   On: 11/03/2013 11:10   Dg Chest Portable 1 View  11/02/2013   CLINICAL DATA:  Leg and feet edema for more than 1 week.  EXAM: PORTABLE CHEST - 1 VIEW  COMPARISON:  02/24/2004 radiographs.  FINDINGS: The heart size is at the upper limits of normal for AP technique. The lungs are clear. There is no pleural effusion or pneumothorax. No acute osseous findings are demonstrated. Telemetry leads overlie the chest.  IMPRESSION: Stable chest.  No acute cardiopulmonary process.   Electronically Signed   By: Camie Patience M.D.   On: 11/02/2013 12:55    Medications:  Prior to Admission:  Prescriptions prior to admission  Medication Sig Dispense Refill  . doxazosin (CARDURA) 2 MG tablet Take 1 tablet by mouth daily.      Marland Kitchen etodolac (LODINE) 400 MG tablet Take 1 tablet by mouth 2 (two) times daily.      . finasteride (PROSCAR) 5 MG tablet Take 1 tablet by mouth daily.      Marland Kitchen lisinopril (PRINIVIL,ZESTRIL) 10 MG tablet Take 1 tablet by mouth daily.      Marland Kitchen omeprazole (PRILOSEC) 20 MG capsule Take 20 mg by mouth daily.      . potassium chloride SA (K-DUR,KLOR-CON) 20 MEQ tablet Take  20 mEq by mouth 3 (three) times daily.      . predniSONE (DELTASONE) 20 MG tablet Take 20 mg by mouth daily with breakfast.      . torsemide (DEMADEX) 20 MG tablet Take 1 tablet by mouth daily.       Scheduled: . furosemide  80 mg Intravenous BID  . heparin  5,000 Units Subcutaneous 3 times per day  . IMMUNE GLOBULIN 10% (HUMAN) IV - For Fluid Restriction Only  400 mg/kg Intravenous Q24H  . metolazone  2.5 mg Oral Daily  . pantoprazole  40 mg Oral Daily  . potassium chloride SA  20 mEq Oral TID  . predniSONE  10  mg Oral Q breakfast  . sodium chloride  3 mL Intravenous Q12H   Continuous:  [id number] (ZOFRAN) IV, ondansetron  Assesment: He has severe bilateral leg edema probably related to taking prednisone. He has an inflammatory polyneuropathy and he is now on IV Ig. He has a history of hypertension which is stable. He has prostate problems Active Problems:   Bilateral leg edema   HTN (hypertension)   Neuropathy    Plan: Continue his current treatments    LOS: 2 days   Alonza Bogus 11/04/2013, 8:47 AM

## 2013-11-05 ENCOUNTER — Ambulatory Visit (HOSPITAL_COMMUNITY): Payer: Medicare Other | Admitting: Physical Therapy

## 2013-11-05 ENCOUNTER — Telehealth (HOSPITAL_COMMUNITY): Payer: Self-pay

## 2013-11-05 DIAGNOSIS — G6181 Chronic inflammatory demyelinating polyneuritis: Secondary | ICD-10-CM | POA: Diagnosis present

## 2013-11-05 LAB — BASIC METABOLIC PANEL
BUN: 24 mg/dL — ABNORMAL HIGH (ref 6–23)
CHLORIDE: 91 meq/L — AB (ref 96–112)
CO2: 28 mEq/L (ref 19–32)
CREATININE: 0.69 mg/dL (ref 0.50–1.35)
Calcium: 8.7 mg/dL (ref 8.4–10.5)
GFR calc non Af Amer: 89 mL/min — ABNORMAL LOW (ref >90)
Glucose, Bld: 162 mg/dL — ABNORMAL HIGH (ref 70–99)
POTASSIUM: 3.3 meq/L — AB (ref 3.7–5.3)
Sodium: 131 mEq/L — ABNORMAL LOW (ref 137–147)

## 2013-11-05 MED ORDER — HYDROCODONE-ACETAMINOPHEN 5-325 MG PO TABS
1.0000 | ORAL_TABLET | ORAL | Status: DC | PRN
  Administered 2013-11-05 – 2013-11-07 (×5): 1 via ORAL
  Filled 2013-11-05 (×5): qty 1

## 2013-11-05 MED ORDER — FUROSEMIDE 10 MG/ML IJ SOLN
40.0000 mg | Freq: Two times a day (BID) | INTRAMUSCULAR | Status: DC
  Administered 2013-11-05 – 2013-11-06 (×4): 40 mg via INTRAVENOUS
  Filled 2013-11-05 (×4): qty 4

## 2013-11-05 MED ORDER — POTASSIUM CHLORIDE CRYS ER 20 MEQ PO TBCR
40.0000 meq | EXTENDED_RELEASE_TABLET | Freq: Three times a day (TID) | ORAL | Status: DC
  Administered 2013-11-05 – 2013-11-07 (×7): 40 meq via ORAL
  Filled 2013-11-05 (×5): qty 2
  Filled 2013-11-05: qty 4
  Filled 2013-11-05 (×3): qty 2

## 2013-11-05 NOTE — Progress Notes (Signed)
Patient ID: Dylan Nelson, male   DOB: 01-18-1934, 80 y.o.   MRN: [id number]  Dylan A. Merlene Laughter, MD     [email or web address]          Dylan Nelson is an 80 y.o. male.   Assessment/Plan: 1. CIDP - Chronic inflammatory demyelinating polyneuropathy. The patient has failed steroid treatment and therefore immunoglobulins will be arranged. He will be placed on the standard 400 mg/kilogram per day for 5 days. May consider cervical spine MRI and also antibodies for myasthenia gravis.  2. Vitamin B12 deficiency. Replacement will be continued.  3. Leg and ankle edema at baseline worsen with prednisone. Prednisone be weaned off.  4. Gait disorder due to the above.  5. Congenital left upper extremity weakness.  6. 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 This be observed for now.  The patient reports that he's overall doing about the same. He has gotten 3/5 today dose of IV immunoglobulins and has tolerated this.   GENERAL: Pleasant and in no acute distress.  HEENT: Supple. Atraumatic normocephalic.  ABDOMEN: soft  EXTREMITIES: Markedly 3+ pitting edema of the feet and ankles and distal legs.  BACK: Normal.  SKIN: Normal by inspection.  MENTAL STATUS: Alert and oriented. Speech, language and cognition are generally intact. Judgment and insight normal.  CRANIAL NERVES: Pupils are equal, round and reactive to light and accommodation; extra ocular movements are full, there is no significant nystagmus; visual fields are full; upper and lower facial muscles are normal in strength and symmetric, there is no flattening of the nasolabial folds; tongue is midline; uvula is midline; shoulder elevation is normal.  MOTOR: Atrophy of the left upper extremity with associated weakness.Left deltoid 3/5 handgrip 4. Right upper extremity shows normal tone, bulk and strength. Hip flexion bilaterally 4/5. Dorsiflexion 0/5.  COORDINATION: Left finger to nose is normal,  right finger to nose is normal, No rest tremor; no intention tremor; no postural tremor; no bradykinesia.  REFLEXES: Deep tendon reflexes are symmetrical But markedly diminished throughout and essentially trace/absent in the legs. Babinski reflexes are flexor bilaterally.  SENSATION: Normal to light touch.        Objective: Vital signs in last 24 hours: Temp:  [97.4 F (36.3 C)-97.9 F (36.6 C)] 97.9 F (36.6 C) (05/20 2000) Pulse Rate:  [99-100] 100 (05/20 2000) Resp:  [18] 18 (05/20 2000) BP: (96-98)/(55-66) 96/55 mmHg (05/20 2000) SpO2:  [94 %-96 %] 96 % (05/20 2000)  Intake/Output from previous day: 05/20 0701 - 05/21 0700 In: 1530 [P.O.:1080; I.V.:450] Out: 4400 [Urine:4400] Intake/Output this shift:   Nutritional status: Cardiac   Lab Results: Results for orders placed during the hospital encounter of 11/02/13 (from the past 48 hour(s))  BASIC METABOLIC PANEL     Status: Abnormal   Collection Time    11/04/13  9:00 AM      Result Value Ref Range   Sodium 133 (*) 137 - 147 mEq/L   Potassium 3.7  3.7 - 5.3 mEq/L   Chloride 91 (*) 96 - 112 mEq/L   CO2 30  19 - 32 mEq/L   Glucose, Bld 180 (*) 70 - 99 mg/dL   BUN 24 (*) 6 - 23 mg/dL   Creatinine, Ser 0.68  0.50 - 1.35 mg/dL   Calcium 9.3  8.4 - 10.5 mg/dL   GFR calc non Af Amer 89 (*) >90 mL/min   GFR calc Af Amer >90  >90 mL/min   Comment: (NOTE)  The eGFR has been calculated using the CKD EPI equation.     This calculation has not been validated in all clinical situations.     eGFR's persistently <90 mL/min signify possible Chronic Kidney     Disease.  BASIC METABOLIC PANEL     Status: Abnormal   Collection Time    11/05/13  5:31 AM      Result Value Ref Range   Sodium 131 (*) 137 - 147 mEq/L   Potassium 3.3 (*) 3.7 - 5.3 mEq/L   Chloride 91 (*) 96 - 112 mEq/L   CO2 28  19 - 32 mEq/L   Glucose, Bld 162 (*) 70 - 99 mg/dL   BUN 24 (*) 6 - 23 mg/dL   Creatinine, Ser 0.69  0.50 - 1.35 mg/dL   Calcium 8.7   8.4 - 10.5 mg/dL   GFR calc non Af Amer 89 (*) >90 mL/min   GFR calc Af Amer >90  >90 mL/min   Comment: (NOTE)     The eGFR has been calculated using the CKD EPI equation.     This calculation has not been validated in all clinical situations.     eGFR's persistently <90 mL/min signify possible Chronic Kidney     Disease.    Lipid Panel No results found for this basename: CHOL, TRIG, HDL, CHOLHDL, VLDL, LDLCALC,  in the last 72 hours  Studies/Results: Mr Cervical Spine Wo Contrast  11/03/2013   CLINICAL DATA:  Neck pain radiating into both arms. No known injury.  EXAM: MRI CERVICAL SPINE WITHOUT CONTRAST  TECHNIQUE: Multiplanar, multisequence MR imaging of the cervical spine was performed. No intravenous contrast was administered.  COMPARISON:  Cervical spine radiographs 07/13/2013.  FINDINGS: The alignment is grossly stable with reversal of the usual cervical lordosis, a grade 1 anterolisthesis at C4-5 and a grade 1 retrolisthesis at C5-6 and C6-7. There is multilevel spondylosis with endplate degeneration and paraspinal osteophytes. There is no evidence of acute fracture or paraspinal abnormality.  The craniocervical junction appears normal. The cervical cord is normal in signal and caliber. There are bilateral vertebral artery flow voids.  C2-3: There is moderate asymmetric facet hypertrophy on the left contributing to mild left foraminal stenosis. No cord deformity.  C3-4: Moderate asymmetric uncinate spurring and facet hypertrophy on the right contribute to moderate right foraminal stenosis. No cord deformity.  C4-5: Chronic spondylosis with loss of disc height, posterior osteophytes and asymmetric uncinate spurring and facet hypertrophy on the right. The CSF surrounding the cord is effaced. There is severe right and moderate left foraminal stenosis.  C5-6: There is chronic spondylosis with advanced loss of disc height, posterior osteophytes and bilateral uncinate spurring. The CSF surrounding  the cord is effaced. Moderate foraminal narrowing is present bilaterally.  C6-7: There is chronic spondylosis with advanced loss of disc height, posterior osteophytes and bilateral uncinate spurring. The CSF surrounding the cord is effaced. Moderate foraminal narrowing is present bilaterally.  C7-T1: Grade 1 anterolisthesis with asymmetric facet hypertrophy on the left. There is mild left foraminal stenosis. No cord deformity.  IMPRESSION: 1. Multilevel cervical spondylosis as described. From C4-5 through C6-7, the CSF surrounding the cord is effaced without cord deformity or myelopathic changes. 2. Posterior osteophytes and uncinate spurring contribute to at least moderate foraminal narrowing at multiple levels as detailed above. This may contribute to exiting nerve root encroachment and upper extremity radicular symptoms. 3. No acute findings suspected.   Electronically Signed   By: Modesta Messing.D.  On: 11/03/2013 12:13   US Venous Img Lower Bilateral  11/03/2013   CLINICAL DATA:  Edema.  EXAM: BILATERAL LOWER EXTREMITY VENOUS DOPPLER ULTRASOUND  TECHNIQUE: Gray-scale sonography with graded compression, as well as color Doppler and duplex ultrasound were performed to evaluate the lower extremity deep venous systems from the level of the common femoral vein and including the common femoral, femoral, profunda femoral, popliteal and calf veins including the posterior tibial, peroneal and gastrocnemius veins when visible. The superficial great saphenous vein was also interrogated. Spectral Doppler was utilized to evaluate flow at rest and with distal augmentation maneuvers in the common femoral, femoral and popliteal veins.  COMPARISON:  None.  FINDINGS: RIGHT LOWER EXTREMITY  Common Femoral Vein: No evidence of thrombus. Normal compressibility, respiratory phasicity and response to augmentation.  Saphenofemoral Junction: No evidence of thrombus. Normal compressibility and flow on color Doppler imaging.   Profunda Femoral Vein: No evidence of thrombus. Normal compressibility and flow on color Doppler imaging.  Femoral Vein: No evidence of thrombus. Normal compressibility, respiratory phasicity and response to augmentation.  Popliteal Vein: No evidence of thrombus. Normal compressibility, respiratory phasicity and response to augmentation.  Calf Veins: No evidence of thrombus. Normal compressibility and flow on color Doppler imaging.  Superficial Great Saphenous Vein: No evidence of thrombus. Normal compressibility and flow on color Doppler imaging.  Venous Reflux:  None.  Other Findings:  None.  LEFT LOWER EXTREMITY  Common Femoral Vein: No evidence of thrombus. Normal compressibility, respiratory phasicity and response to augmentation.  Saphenofemoral Junction: No evidence of thrombus. Normal compressibility and flow on color Doppler imaging.  Profunda Femoral Vein: No evidence of thrombus. Normal compressibility and flow on color Doppler imaging.  Femoral Vein: No evidence of thrombus. Normal compressibility, respiratory phasicity and response to augmentation.  Popliteal Vein: No evidence of thrombus. Normal compressibility, respiratory phasicity and response to augmentation.  Calf Veins: No evidence of thrombus. Normal compressibility and flow on color Doppler imaging.  Superficial Great Saphenous Vein: No evidence of thrombus. Normal compressibility and flow on color Doppler imaging.  Venous Reflux:  None.  Other Findings:  None.  IMPRESSION: No evidence of deep venous thrombosis.   Electronically Signed   By: Marcello Moores  Register   On: 11/03/2013 11:10   The cervical spine MRI is reviewed in person and shows multilevel disc osteophyte complex most severe at C4-C5 causing near complete obliteration of the CSF. No clear evidence of compression however or of myopathy.   Medications:  Scheduled Meds: . furosemide  40 mg Intravenous BID  . heparin  5,000 Units Subcutaneous 3 times per day  . IMMUNE GLOBULIN 10%  (HUMAN) IV - For Fluid Restriction Only  400 mg/kg Intravenous Q24H  . pantoprazole  40 mg Oral Daily  . potassium chloride SA  40 mEq Oral TID  . predniSONE  10 mg Oral Q breakfast  . sodium chloride  3 mL Intravenous Q12H   Continuous Infusions:  PRN Meds:.ondansetron (ZOFRAN) IV, ondansetron     LOS: 3 days   Darelle Kings A. Merlene Nelson, M.D.  Diplomate, [employer] of [employer]iatry and Neurology ( Neurology).

## 2013-11-05 NOTE — Consult Note (Signed)
WOC wound consult note Reason for Consult: Open boil to perineum/scrotal area.  Wound type:Inflammatory/infectious Measurement: 2 cm x 1.8 cm x 2 cm with 6 cm tunneling at 9 o'clock, towards scrotum.  Perineum/scrotum are erythematous. Wound bed: Large plug of devitalized, stringy slough was able to be easily trimmed, revealing 6 cm tunnel with copious amount purulent, creamy drainage.  Drained and cleansed away as much of this purulence as possible.  Wound culture is pending.  Drainage (amount, consistency, odor) Copious, creamy, green drainage.  Musty odor.  Periwound:Erythematous.  Patient denies tenderness at the site.  States this area has been red prior to admission and he has been applying Neosporin to it.  With recent steroid intake, may have immunosuppressed to exhibiting signs or symptoms of infection.  Dressing procedure/placement/frequency: Cleanse with NS and pat gently dry.  Pack wound with Iodoform packing strip (including 6 cm tunnel at 9 o'clock and undermining at wound margins).  Top with 4x4 and secure with Medipore tape, avoiding rectal opening.  Change daily and PRN strikethrough drainage or bowel movement. Conservative sharp wound debridement (CSWD performed at the bedside): 3 cm x 3 cm plug of yellow, nonadherent slough trimmed from wound bed.  Will not follow at this time.  Please re-consult if needed.  Domenic Moras RN BSN [place] Pager [phone]

## 2013-11-05 NOTE — Progress Notes (Signed)
Subjective: He says he feels about the same. He has no new complaints. His weakness is unchanged.  Objective: Vital signs in last 24 hours: Temp:  [97.4 F (36.3 C)-97.9 F (36.6 C)] 97.9 F (36.6 C) (05/20 2000) Pulse Rate:  [99-100] 100 (05/20 2000) Resp:  [18] 18 (05/20 2000) BP: (96-98)/(55-66) 96/55 mmHg (05/20 2000) SpO2:  [94 %-96 %] 96 % (05/20 2000) Weight change:  Last BM Date: 11/04/13  Intake/Output from previous day: 05/20 0701 - 05/21 0700 In: 1530 [P.O.:1080; I.V.:450] Out: 4400 [Urine:4400]  PHYSICAL EXAM General appearance: alert, cooperative and no distress Resp: clear to auscultation bilaterally Cardio: regular rate and rhythm, S1, S2 normal, no murmur, click, rub or gallop GI: soft, non-tender; bowel sounds normal; no masses,  no organomegaly Extremities: Edema of his legs is much better he still has some edema of his feet  Lab Results:    Basic Metabolic Panel:  Recent Labs  11/02/13 1257 11/02/13 1305  11/04/13 0900 11/05/13 0531  NA 135*  --   < > 133* 131*  K 4.6  --   < > 3.7 3.3*  CL 98  --   < > 91* 91*  CO2 26  --   < > 30 28  GLUCOSE 165*  --   < > 180* 162*  BUN 34*  --   < > 24* 24*  CREATININE 0.77  --   < > 0.68 0.69  CALCIUM 9.2  --   < > 9.3 8.7  MG  --  1.8  --   --   --   < > = values in this interval not displayed. Liver Function Tests:  Recent Labs  11/02/13 1305 11/03/13 0517  AST 12 11  ALT 17 15  ALKPHOS 61 68  BILITOT 0.7 0.5  PROT 5.1* 5.9*  ALBUMIN 2.1* 2.0*   No results found for this basename: LIPASE, AMYLASE,  in the last 72 hours No results found for this basename: AMMONIA,  in the last 72 hours CBC:  Recent Labs  11/02/13 1257 11/03/13 0517  WBC 22.4* 19.2*  NEUTROABS 20.2*  --   HGB 12.7* 13.5  HCT 37.5* 39.8  MCV 88.9 89.0  PLT 230 230   Cardiac Enzymes:  Recent Labs  11/02/13 1257  TROPONINI <0.30   BNP:  Recent Labs  11/02/13 1257 11/03/13 0517  PROBNP 139.9 146.3    D-Dimer: No results found for this basename: DDIMER,  in the last 72 hours CBG: No results found for this basename: GLUCAP,  in the last 72 hours Hemoglobin A1C: No results found for this basename: HGBA1C,  in the last 72 hours Fasting Lipid Panel: No results found for this basename: CHOL, HDL, LDLCALC, TRIG, CHOLHDL, LDLDIRECT,  in the last 72 hours Thyroid Function Tests:  Recent Labs  11/03/13 0517  TSH 1.940   Anemia Panel: No results found for this basename: VITAMINB12, FOLATE, FERRITIN, TIBC, IRON, RETICCTPCT,  in the last 72 hours Coagulation: No results found for this basename: LABPROT, INR,  in the last 72 hours Urine Drug Screen: Drugs of Abuse  No results found for this basename: labopia, cocainscrnur, labbenz, amphetmu, thcu, labbarb    Alcohol Level: No results found for this basename: ETH,  in the last 72 hours Urinalysis:  Recent Labs  11/02/13 1430  COLORURINE YELLOW  LABSPEC 1.010  PHURINE 6.0  GLUCOSEU NEGATIVE  HGBUR NEGATIVE  BILIRUBINUR LARGE*  KETONESUR NEGATIVE  PROTEINUR NEGATIVE  UROBILINOGEN 0.2  NITRITE NEGATIVE  LEUKOCYTESUR NEGATIVE   Misc. Labs:  ABGS No results found for this basename: PHART, PCO2, PO2ART, TCO2, HCO3,  in the last 72 hours CULTURES Recent Results (from the past 240 hour(s))  URINE CULTURE     Status: None   Collection Time    11/02/13  2:30 PM      Result Value Ref Range Status   Specimen Description URINE, CATHETERIZED   Final   Special Requests NONE   Final   Culture  Setup Time     Final   Value: 11/02/2013 23:00     Performed at [facility]     Final   Value: NO GROWTH     Performed at [facility]   Culture     Final   Value: NO GROWTH     Performed at [facility]   Report Status 11/03/2013 FINAL   Final   Studies/Results: Mr Cervical Spine Wo Contrast  11/03/2013   CLINICAL DATA:  Neck pain radiating into both arms. No known injury.  EXAM: MRI CERVICAL  SPINE WITHOUT CONTRAST  TECHNIQUE: Multiplanar, multisequence MR imaging of the cervical spine was performed. No intravenous contrast was administered.  COMPARISON:  Cervical spine radiographs 07/13/2013.  FINDINGS: The alignment is grossly stable with reversal of the usual cervical lordosis, a grade 1 anterolisthesis at C4-5 and a grade 1 retrolisthesis at C5-6 and C6-7. There is multilevel spondylosis with endplate degeneration and paraspinal osteophytes. There is no evidence of acute fracture or paraspinal abnormality.  The craniocervical junction appears normal. The cervical cord is normal in signal and caliber. There are bilateral vertebral artery flow voids.  C2-3: There is moderate asymmetric facet hypertrophy on the left contributing to mild left foraminal stenosis. No cord deformity.  C3-4: Moderate asymmetric uncinate spurring and facet hypertrophy on the right contribute to moderate right foraminal stenosis. No cord deformity.  C4-5: Chronic spondylosis with loss of disc height, posterior osteophytes and asymmetric uncinate spurring and facet hypertrophy on the right. The CSF surrounding the cord is effaced. There is severe right and moderate left foraminal stenosis.  C5-6: There is chronic spondylosis with advanced loss of disc height, posterior osteophytes and bilateral uncinate spurring. The CSF surrounding the cord is effaced. Moderate foraminal narrowing is present bilaterally.  C6-7: There is chronic spondylosis with advanced loss of disc height, posterior osteophytes and bilateral uncinate spurring. The CSF surrounding the cord is effaced. Moderate foraminal narrowing is present bilaterally.  C7-T1: Grade 1 anterolisthesis with asymmetric facet hypertrophy on the left. There is mild left foraminal stenosis. No cord deformity.  IMPRESSION: 1. Multilevel cervical spondylosis as described. From C4-5 through C6-7, the CSF surrounding the cord is effaced without cord deformity or myelopathic changes. 2.  Posterior osteophytes and uncinate spurring contribute to at least moderate foraminal narrowing at multiple levels as detailed above. This may contribute to exiting nerve root encroachment and upper extremity radicular symptoms. 3. No acute findings suspected.   Electronically Signed   By: Camie Patience M.D.   On: 11/03/2013 12:13   US Venous Img Lower Bilateral  11/03/2013   CLINICAL DATA:  Edema.  EXAM: BILATERAL LOWER EXTREMITY VENOUS DOPPLER ULTRASOUND  TECHNIQUE: Gray-scale sonography with graded compression, as well as color Doppler and duplex ultrasound were performed to evaluate the lower extremity deep venous systems from the level of the common femoral vein and including the common femoral, femoral, profunda femoral, popliteal and calf veins including the posterior tibial, peroneal and  gastrocnemius veins when visible. The superficial great saphenous vein was also interrogated. Spectral Doppler was utilized to evaluate flow at rest and with distal augmentation maneuvers in the common femoral, femoral and popliteal veins.  COMPARISON:  None.  FINDINGS: RIGHT LOWER EXTREMITY  Common Femoral Vein: No evidence of thrombus. Normal compressibility, respiratory phasicity and response to augmentation.  Saphenofemoral Junction: No evidence of thrombus. Normal compressibility and flow on color Doppler imaging.  Profunda Femoral Vein: No evidence of thrombus. Normal compressibility and flow on color Doppler imaging.  Femoral Vein: No evidence of thrombus. Normal compressibility, respiratory phasicity and response to augmentation.  Popliteal Vein: No evidence of thrombus. Normal compressibility, respiratory phasicity and response to augmentation.  Calf Veins: No evidence of thrombus. Normal compressibility and flow on color Doppler imaging.  Superficial Great Saphenous Vein: No evidence of thrombus. Normal compressibility and flow on color Doppler imaging.  Venous Reflux:  None.  Other Findings:  None.  LEFT LOWER  EXTREMITY  Common Femoral Vein: No evidence of thrombus. Normal compressibility, respiratory phasicity and response to augmentation.  Saphenofemoral Junction: No evidence of thrombus. Normal compressibility and flow on color Doppler imaging.  Profunda Femoral Vein: No evidence of thrombus. Normal compressibility and flow on color Doppler imaging.  Femoral Vein: No evidence of thrombus. Normal compressibility, respiratory phasicity and response to augmentation.  Popliteal Vein: No evidence of thrombus. Normal compressibility, respiratory phasicity and response to augmentation.  Calf Veins: No evidence of thrombus. Normal compressibility and flow on color Doppler imaging.  Superficial Great Saphenous Vein: No evidence of thrombus. Normal compressibility and flow on color Doppler imaging.  Venous Reflux:  None.  Other Findings:  None.  IMPRESSION: No evidence of deep venous thrombosis.   Electronically Signed   By: [place]   On: 11/03/2013 11:10    Medications:  Prior to Admission:  Prescriptions prior to admission  Medication Sig Dispense Refill  . doxazosin (CARDURA) 2 MG tablet Take 1 tablet by mouth daily.      Marland Kitchen etodolac (LODINE) 400 MG tablet Take 1 tablet by mouth 2 (two) times daily.      . finasteride (PROSCAR) 5 MG tablet Take 1 tablet by mouth daily.      Marland Kitchen lisinopril (PRINIVIL,ZESTRIL) 10 MG tablet Take 1 tablet by mouth daily.      Marland Kitchen omeprazole (PRILOSEC) 20 MG capsule Take 20 mg by mouth daily.      . potassium chloride SA (K-DUR,KLOR-CON) 20 MEQ tablet Take 20 mEq by mouth 3 (three) times daily.      . predniSONE (DELTASONE) 20 MG tablet Take 20 mg by mouth daily with breakfast.      . torsemide (DEMADEX) 20 MG tablet Take 1 tablet by mouth daily.       Scheduled: . furosemide  40 mg Intravenous BID  . heparin  5,000 Units Subcutaneous 3 times per day  . IMMUNE GLOBULIN 10% (HUMAN) IV - For Fluid Restriction Only  400 mg/kg Intravenous Q24H  . metolazone  2.5 mg Oral Daily   . pantoprazole  40 mg Oral Daily  . potassium chloride SA  40 mEq Oral TID  . predniSONE  10 mg Oral Q breakfast  . sodium chloride  3 mL Intravenous Q12H   Continuous:  XBW:[id number] (ZOFRAN) IV, ondansetron  Assesment: He has chronic inflammatory demyelinating polyneuropathy. He is being treated now with IVIG. Prednisone was used but he did not have much response from that and developed severe edema.  He has hypertension which  is pretty well controlled.  He has hypokalemia related to diuresis and his diuretics will be decreased Active Problems:   Bilateral leg edema   HTN (hypertension)   Neuropathy    Plan: Replace his potassium and decrease his diuretics continue IVIG    LOS: 3 days   Alonza Bogus 11/05/2013, 8:36 AM

## 2013-11-06 LAB — BASIC METABOLIC PANEL
BUN: 22 mg/dL (ref 6–23)
CALCIUM: 8.6 mg/dL (ref 8.4–10.5)
CHLORIDE: 92 meq/L — AB (ref 96–112)
CO2: 28 meq/L (ref 19–32)
Creatinine, Ser: 0.66 mg/dL (ref 0.50–1.35)
GFR calc Af Amer: >90 mL/min (ref >90)
GFR calc non Af Amer: >90 mL/min (ref >90)
GLUCOSE: 118 mg/dL — AB (ref 70–99)
POTASSIUM: 4 meq/L (ref 3.7–5.3)
SODIUM: 130 meq/L — AB (ref 137–147)

## 2013-11-06 MED ORDER — DOXYCYCLINE HYCLATE 100 MG PO TABS
100.0000 mg | ORAL_TABLET | Freq: Two times a day (BID) | ORAL | Status: DC
  Administered 2013-11-06 – 2013-11-07 (×3): 100 mg via ORAL
  Filled 2013-11-06 (×3): qty 1

## 2013-11-06 NOTE — Progress Notes (Signed)
Subjective: Or he feels okay. He had an open boil the came up. Culture is pending. Wound care consult is noted and appreciated. I discussed with him my concern that he may not be able to return directly home and he is concerned about that as well  Objective: Vital signs in last 24 hours: Temp:  [97.9 F (36.6 C)-98.6 F (37 C)] 98.4 F (36.9 C) (05/22 0436) Pulse Rate:  [81-92] 87 (05/22 0436) Resp:  [18-20] 18 (05/22 0436) BP: (91-115)/(52-71) 104/62 mmHg (05/22 0436) SpO2:  [92 %-96 %] 92 % (05/22 0436) Weight:  [103.3 kg (227 lb 11.8 oz)] 103.3 kg (227 lb 11.8 oz) (05/22 0436) Weight change:  Last BM Date: 11/04/13  Intake/Output from previous day: 05/21 0701 - 05/22 0700 In: 360 [P.O.:360] Out: 3600 [Urine:3600]  PHYSICAL EXAM General appearance: alert, cooperative and mild distress Resp: clear to auscultation bilaterally Cardio: regular rate and rhythm, S1, S2 normal, no murmur, click, rub or gallop GI: soft, non-tender; bowel sounds normal; no masses,  no organomegaly Extremities: His edema of his legs has essentially resolved but he still has some edema of his feet  Lab Results:    Basic Metabolic Panel:  Recent Labs  11/05/13 0531 11/06/13 0507  NA 131* 130*  K 3.3* 4.0  CL 91* 92*  CO2 28 28  GLUCOSE 162* 118*  BUN 24* 22  CREATININE 0.69 0.66  CALCIUM 8.7 8.6   Liver Function Tests: No results found for this basename: AST, ALT, ALKPHOS, BILITOT, PROT, ALBUMIN,  in the last 72 hours No results found for this basename: LIPASE, AMYLASE,  in the last 72 hours No results found for this basename: AMMONIA,  in the last 72 hours CBC: No results found for this basename: WBC, NEUTROABS, HGB, HCT, MCV, PLT,  in the last 72 hours Cardiac Enzymes: No results found for this basename: CKTOTAL, CKMB, CKMBINDEX, TROPONINI,  in the last 72 hours BNP: No results found for this basename: PROBNP,  in the last 72 hours D-Dimer: No results found for this basename:  DDIMER,  in the last 72 hours CBG: No results found for this basename: GLUCAP,  in the last 72 hours Hemoglobin A1C: No results found for this basename: HGBA1C,  in the last 72 hours Fasting Lipid Panel: No results found for this basename: CHOL, HDL, LDLCALC, TRIG, CHOLHDL, LDLDIRECT,  in the last 72 hours Thyroid Function Tests: No results found for this basename: TSH, T4TOTAL, FREET4, T3FREE, THYROIDAB,  in the last 72 hours Anemia Panel: No results found for this basename: VITAMINB12, FOLATE, FERRITIN, TIBC, IRON, RETICCTPCT,  in the last 72 hours Coagulation: No results found for this basename: LABPROT, INR,  in the last 72 hours Urine Drug Screen: Drugs of Abuse  No results found for this basename: labopia, cocainscrnur, labbenz, amphetmu, thcu, labbarb    Alcohol Level: No results found for this basename: ETH,  in the last 72 hours Urinalysis: No results found for this basename: COLORURINE, APPERANCEUR, LABSPEC, PHURINE, GLUCOSEU, HGBUR, BILIRUBINUR, KETONESUR, PROTEINUR, UROBILINOGEN, NITRITE, LEUKOCYTESUR,  in the last 72 hours Misc. Labs:  ABGS No results found for this basename: PHART, PCO2, PO2ART, TCO2, HCO3,  in the last 72 hours CULTURES Recent Results (from the past 240 hour(s))  URINE CULTURE     Status: None   Collection Time    11/02/13  2:30 PM      Result Value Ref Range Status   Specimen Description URINE, CATHETERIZED   Final   Special Requests NONE  Final   Culture  Setup Time     Final   Value: 11/02/2013 23:00     Performed at [facility]     Final   Value: NO GROWTH     Performed at [facility]   Culture     Final   Value: NO GROWTH     Performed at [facility]   Report Status 11/03/2013 FINAL   Final  WOUND CULTURE     Status: None   Collection Time    11/05/13 10:30 AM      Result Value Ref Range Status   Specimen Description WOUND PERIRECTAL   Final   Special Requests IMMUNE:COMPROMISED   Final    Gram Stain PENDING   Incomplete   Culture     Final   Value: Culture reincubated for better growth     Performed at [facility]   Report Status PENDING   Incomplete   Studies/Results: No results found.  Medications:  Prior to Admission:  Prescriptions prior to admission  Medication Sig Dispense Refill  . doxazosin (CARDURA) 2 MG tablet Take 1 tablet by mouth daily.      Marland Kitchen etodolac (LODINE) 400 MG tablet Take 1 tablet by mouth 2 (two) times daily.      . finasteride (PROSCAR) 5 MG tablet Take 1 tablet by mouth daily.      Marland Kitchen lisinopril (PRINIVIL,ZESTRIL) 10 MG tablet Take 1 tablet by mouth daily.      Marland Kitchen omeprazole (PRILOSEC) 20 MG capsule Take 20 mg by mouth daily.      . potassium chloride SA (K-DUR,KLOR-CON) 20 MEQ tablet Take 20 mEq by mouth 3 (three) times daily.      . predniSONE (DELTASONE) 20 MG tablet Take 20 mg by mouth daily with breakfast.      . torsemide (DEMADEX) 20 MG tablet Take 1 tablet by mouth daily.       Scheduled: . furosemide  40 mg Intravenous BID  . heparin  5,000 Units Subcutaneous 3 times per day  . IMMUNE GLOBULIN 10% (HUMAN) IV - For Fluid Restriction Only  400 mg/kg Intravenous Q24H  . pantoprazole  40 mg Oral Daily  . potassium chloride SA  40 mEq Oral TID  . predniSONE  10 mg Oral Q breakfast  . sodium chloride  3 mL Intravenous Q12H   Continuous:  BMW:[id number], ondansetron (ZOFRAN) IV, ondansetron  Assesment: He was admitted because of severe bilateral leg edema that was probably related to prednisone. He has chronic inflammatory demyelinating polyneuropathy and even at his best is barely functional concerned that he may not be able to manage at home. He has an open boil in his sacrum. Active Problems:   Bilateral leg edema   HTN (hypertension)   Neuropathy   CIDP (chronic inflammatory demyelinating polyneuropathy)    Plan: Physical therapy consultation. He will have doxycycline for the boil    LOS: 4 days    Dylan Nelson 11/06/2013, 8:38 AM

## 2013-11-06 NOTE — Clinical Social Work Psychosocial (Signed)
Clinical Social Work Department BRIEF PSYCHOSOCIAL ASSESSMENT 11/06/2013  Patient:  Dylan Nelson, Dylan Nelson     Account Number:  [id number]     Admit date:  11/02/2013  Clinical Social Worker:  Wyatt Haste  Date/Time:  11/06/2013 01:55 PM  Referred by:  Physician  Date Referred:  11/06/2013 Referred for  SNF Placement   Other Referral:   Interview type:  Patient Other interview type:   wife    PSYCHOSOCIAL DATA Living Status:  WIFE Admitted from facility:   Level of care:   Primary support name:  Dylan Nelson Primary support relationship to patient:  SPOUSE Degree of support available:   supportive    CURRENT CONCERNS Current Concerns  Post-Acute Placement   Other Concerns:    SOCIAL WORK ASSESSMENT / PLAN CSW met with pt and pt's wife at bedside. Pt alert and oriented. He reports his legs started swelling and making it very difficult to transfer. Pt came to ED and was admitted with bilateral edema. He lives with his wife. Pt admits things have been very difficult at home with his needs. He generally uses a wheelchair and transfers with assist. Pt also has a scooter. PT evaluated pt today and recommendation is for SNF. He also has an open boil to scrotum. CSW discussed placement process. He at first thought PT was talking about outpatient PT and he has done this before. Pt initially refused to consider going to SNF. When reminded about his wound care, but admitted that he probably could not handle that at home. Aware of Medicare coverage/criteria. SNF list provided. Pt requests [facility] first, then [facility].   Assessment/plan status:  Psychosocial Support/Ongoing Assessment of Needs Other assessment/ plan:   Information/referral to community resources:   SNF list    PATIENT'S/FAMILY'S RESPONSE TO PLAN OF CARE: Pt and wife agreeable to SNF. CSW will initiate bed search and follow up with bed offers when available. Possible d/c tomorrow per MD.       Benay Pike, [place]

## 2013-11-06 NOTE — Clinical Social Work Placement (Signed)
Clinical Social Work Department CLINICAL SOCIAL WORK PLACEMENT NOTE 11/06/2013  Patient:  Dylan Nelson, Dylan Nelson  Account Number:  [id number] Admit date:  11/02/2013  Clinical Social Worker:  Benay Pike, LCSW  Date/time:  11/06/2013 01:53 PM  Clinical Social Work is seeking post-discharge placement for this patient at the following level of care:   [facility]   (*CSW will update this form in Epic as items are completed)   11/06/2013  Patient/family provided with [facility] Department of Clinical Social Work's list of facilities offering this level of care within the geographic area requested by the patient (or if unable, by the patient's family).  11/06/2013  Patient/family informed of their freedom to choose among providers that offer the needed level of care, that participate in Medicare, Medicaid or managed care program needed by the patient, have an available bed and are willing to accept the patient.  11/06/2013  Patient/family informed of [facility]' ownership interest in [facility], as well as of the fact that they are under no obligation to receive care at this facility.  PASARR submitted to EDS on 11/06/2013 PASARR number received from EDS on 11/06/2013  FL2 transmitted to all facilities in geographic area requested by pt/family on  11/06/2013 FL2 transmitted to all facilities within larger geographic area on   Patient informed that his/her managed care company has contracts with or will negotiate with  certain facilities, including the following:     Patient/family informed of bed offers received:   Patient chooses bed at  Physician recommends and patient chooses bed at    Patient to be transferred to  on   Patient to be transferred to facility by   The following physician request were entered in Epic:   Additional Comments:  Benay Pike, [place]

## 2013-11-06 NOTE — Plan of Care (Signed)
Problem: Phase I Progression Outcomes Goal: Voiding-avoid urinary catheter unless indicated Outcome: Not Progressing Patient currently has foley for strict I&O with IV lasix and d/t perineal wound

## 2013-11-06 NOTE — Evaluation (Signed)
Physical Therapy Evaluation Patient Details Name: Dylan Nelson MRN: [id number] DOB: [birth year] Today's Date: 11/06/2013   History of Present Illness  80 yo male adm05/18/15 with LE edema; PMHx: CIDP, HTN  Clinical Impression  Pt will benefit from PT to address deficits below; He is  requiring total assist for basic tasks/mobility/transfers; family unable to handle him at his current status; Recommend SNF post acute; will continue to follow     Follow Up Recommendations SNF;Supervision/Assistance - 24 hour    Equipment Recommendations  None recommended by PT    Recommendations for Other Services       Precautions / Restrictions Precautions Precautions: Fall      Mobility  Bed Mobility Overal bed mobility: Needs Assistance Bed Mobility: Supine to Sit     Supine to sit: Max assist     General bed mobility comments: assist with LEs and trunk to come to completed sit, verbal and tactile cues to assist self  Transfers Overall transfer level: Needs assistance   Transfers: Lateral/Scoot Transfers          Lateral/Scoot Transfers: With slide board General transfer comment: verbal and tactile cues for bed to recliner transfer; pt requires extensive assist to get to chair, unable to use LEs and UE functionally to assist, much difficulty with anterior and lateral wt shifting  Ambulation/Gait                Stairs            Wheelchair Mobility    Modified Rankin (Stroke Patients Only)       Balance Overall balance assessment: Needs assistance Sitting-balance support: Single extremity supported;Feet supported Sitting balance-Leahy Scale: Poor   Postural control: Posterior lean;Left lateral lean                                   Pertinent Vitals/Pain No c/o pain    Home Living Family/patient expects to be discharged to:: Private residence Living Arrangements: Spouse/significant other Available Help at Discharge: Family Type of Home:  House Home Access: Ramped entrance     Home Layout: One level Home Equipment: Wheelchair - power;Electric scooter;Other (comment) Additional Comments: sliding board    Prior Function Level of Independence: Needs assistance   Gait / Transfers Assistance Needed: sliding board transfers with assist/set up from wife or grandson   ADL's / Homemaking Assistance Needed: assist with sponge baths from wife  Comments: very difficult to get information from-gives repeated conflicting reports; (ie-states he stands and showers and then later states he sponge bathes); family challenging as well to get a clear picture of pt     Hand Dominance        Extremity/Trunk Assessment   Upper Extremity Assessment: RUE deficits/detail;LUE deficits/detail     RUE Sensation: decreased proprioception;history of peripheral neuropathy LUE Deficits / Details: LUE more impaired than R due to CIDP; defer details to OT   Lower Extremity Assessment: RLE deficits/detail;LLE deficits/detail RLE Deficits / Details: ankles 0/5; AAROM at hips and knees grossly WFL, strength 2 to 2+/5 grossly bil LEs       Communication   Communication: No difficulties  Cognition Arousal/Alertness: Awake/alert Behavior During Therapy: WFL for tasks assessed/performed Overall Cognitive Status: Difficult to assess Area of Impairment: Memory;Problem solving;Safety/judgement         Safety/Judgement: Decreased awareness of deficits   Problem Solving: Difficulty sequencing;Slow processing;Requires verbal cues;Requires tactile cues  General Comments General comments (skin integrity, edema, etc.): LE edema resolving    Exercises        Assessment/Plan    PT Assessment Patient needs continued PT services  PT Diagnosis Difficulty walking   PT Problem List Decreased strength;Decreased coordination;Decreased balance;Decreased mobility;Decreased safety awareness;Decreased activity tolerance;Decreased range of motion   PT Treatment Interventions Functional mobility training;Therapeutic activities;Therapeutic exercise;Patient/family education;Balance training;Neuromuscular re-education   PT Goals (Current goals can be found in the Care Plan section) Acute Rehab PT Goals Patient Stated Goal: be able to transfer with less assist PT Goal Formulation: With patient/family Time For Goal Achievement: 11/20/13 Potential to Achieve Goals: Fair    Frequency Min 3X/week   Barriers to discharge   family would be unable to manage pt at his current status; if home would need hoyer lift and hospital bed    Co-evaluation               End of Session     Patient left: in chair;with call bell/phone within reach;with family/visitor present Nurse Communication: Mobility status;Need for lift equipment (maxi sky for return to bed)         Time: 4944-9675 PT Time Calculation (min): 42 min   Charges:   PT Evaluation $Initial PT Evaluation Tier I: 1 Procedure PT Treatments $Therapeutic Activity: 38-52 mins   PT G Codes:          Neil Crouch 11/06/2013, 12:36 PM

## 2013-11-06 NOTE — Clinical Social Work Note (Signed)
Bed offer at [facility] accepted by pt. Possible d/c tomorrow per MD. Facility aware.   Benay Pike, [place]

## 2013-11-06 NOTE — Clinical Social Work Placement (Signed)
Clinical Social Work Department CLINICAL SOCIAL WORK PLACEMENT NOTE 11/06/2013  Patient:  RANEN, DOOLIN  Account Number:  [id number] Admit date:  11/02/2013  Clinical Social Worker:  Benay Pike, LCSW  Date/time:  11/06/2013 01:53 PM  Clinical Social Work is seeking post-discharge placement for this patient at the following level of care:   [facility]   (*CSW will update this form in Epic as items are completed)   11/06/2013  Patient/family provided with [facility] Department of Clinical Social Work's list of facilities offering this level of care within the geographic area requested by the patient (or if unable, by the patient's family).  11/06/2013  Patient/family informed of their freedom to choose among providers that offer the needed level of care, that participate in Medicare, Medicaid or managed care program needed by the patient, have an available bed and are willing to accept the patient.  11/06/2013  Patient/family informed of [facility]' ownership interest in [facility], as well as of the fact that they are under no obligation to receive care at this facility.  PASARR submitted to EDS on 11/06/2013 PASARR number received from EDS on 11/06/2013  FL2 transmitted to all facilities in geographic area requested by pt/family on  11/06/2013 FL2 transmitted to all facilities within larger geographic area on   Patient informed that his/her managed care company has contracts with or will negotiate with  certain facilities, including the following:     Patient/family informed of bed offers received:  11/06/2013 Patient chooses bed at [facility] Physician recommends and patient chooses bed at  [facility]  Patient to be transferred to  on   Patient to be transferred to facility by   The following physician request were entered in Epic:   Additional Comments:  Benay Pike, [place]

## 2013-11-07 DIAGNOSIS — N492 Inflammatory disorders of scrotum: Secondary | ICD-10-CM | POA: Diagnosis present

## 2013-11-07 MED ORDER — DOCUSATE SODIUM 100 MG PO CAPS: 100.0000 mg | ORAL_CAPSULE | Freq: Two times a day (BID) | ORAL | Status: DC

## 2013-11-07 MED ORDER — HEPARIN SODIUM (PORCINE) 5000 UNIT/ML IJ SOLN: 5000.0000 [IU] | Freq: Three times a day (TID) | INTRAMUSCULAR | Status: DC

## 2013-11-07 MED ORDER — POTASSIUM CHLORIDE CRYS ER 20 MEQ PO TBCR: 40.0000 meq | EXTENDED_RELEASE_TABLET | Freq: Three times a day (TID) | ORAL | Status: DC

## 2013-11-07 MED ORDER — PREDNISONE 10 MG PO TABS: 10.0000 mg | ORAL_TABLET | Freq: Every day | ORAL | Status: AC

## 2013-11-07 MED ORDER — DOXYCYCLINE HYCLATE 100 MG PO TABS: 100.0000 mg | ORAL_TABLET | Freq: Two times a day (BID) | ORAL | Status: DC

## 2013-11-07 MED ORDER — HYDROCODONE-ACETAMINOPHEN 5-325 MG PO TABS: 1.0000 | ORAL_TABLET | ORAL | Status: DC | PRN

## 2013-11-07 NOTE — Progress Notes (Signed)
Patient to be DC'd to [facility] this afternoon.  Report called and given to Dina at facility.  Wife and patient aware.  Patient finishing last dose of IV Ig before removal of IV.  Patient dressed in transfer gown.  Patient stable at this time.  EMS to transport.

## 2013-11-07 NOTE — Progress Notes (Addendum)
Patients foley removed today d/t patients IV lasix being D/c'd.  Dr. Luan Pulling OK and aware of this.  Patient has been unable to void since, though.  Has sensation, but feels that "something is blocking the flow".  Bladder scan reveals approx 350 cc. On call MD paged and made aware, as well as that patient is to DC to [facility] today.  Orders given to reinsert foley cath.

## 2013-11-07 NOTE — Plan of Care (Signed)
Problem: Phase III Progression Outcomes Goal: Pain controlled on oral analgesia Patient unable to pass voiding trial - foley reinserted Goal: Activity at appropriate level-compared to baseline (UP IN CHAIR FOR HEMODIALYSIS)  Outcome: Adequate for Discharge Patient to DC to [facility] for rehab

## 2013-11-07 NOTE — Progress Notes (Signed)
Subjective: He feels okay and has no new complaints. His breathing is okay. He is still very weak. He still has Foley catheter in.  Objective: Vital signs in last 24 hours: Temp:  [98.1 F (36.7 C)-98.9 F (37.2 C)] 98.4 F (36.9 C) (05/23 0644) Pulse Rate:  [94-102] 98 (05/22 2133) Resp:  [18-20] 18 (05/23 0644) BP: (110-120)/(68-74) 116/68 mmHg (05/23 0644) SpO2:  [92 %-98 %] 98 % (05/23 0644) Weight:  [101.6 kg (223 lb 15.8 oz)] 101.6 kg (223 lb 15.8 oz) (05/23 0644) Weight change: -1.7 kg (-3 lb 12 oz) Last BM Date: 11/04/13  Intake/Output from previous day: 05/22 0701 - 05/23 0700 In: 720 [P.O.:720] Out: 3050 [Urine:3050]  PHYSICAL EXAM General appearance: alert, cooperative and no distress Resp: clear to auscultation bilaterally Cardio: regular rate and rhythm, S1, S2 normal, no murmur, click, rub or gallop GI: soft, non-tender; bowel sounds normal; no masses,  no organomegaly Extremities: He still has some edema of his feet but not of his legs  Lab Results:    Basic Metabolic Panel:  Recent Labs  11/05/13 0531 11/06/13 0507  NA 131* 130*  K 3.3* 4.0  CL 91* 92*  CO2 28 28  GLUCOSE 162* 118*  BUN 24* 22  CREATININE 0.69 0.66  CALCIUM 8.7 8.6   Liver Function Tests: No results found for this basename: AST, ALT, ALKPHOS, BILITOT, PROT, ALBUMIN,  in the last 72 hours No results found for this basename: LIPASE, AMYLASE,  in the last 72 hours No results found for this basename: AMMONIA,  in the last 72 hours CBC: No results found for this basename: WBC, NEUTROABS, HGB, HCT, MCV, PLT,  in the last 72 hours Cardiac Enzymes: No results found for this basename: CKTOTAL, CKMB, CKMBINDEX, TROPONINI,  in the last 72 hours BNP: No results found for this basename: PROBNP,  in the last 72 hours D-Dimer: No results found for this basename: DDIMER,  in the last 72 hours CBG: No results found for this basename: GLUCAP,  in the last 72 hours Hemoglobin A1C: No  results found for this basename: HGBA1C,  in the last 72 hours Fasting Lipid Panel: No results found for this basename: CHOL, HDL, LDLCALC, TRIG, CHOLHDL, LDLDIRECT,  in the last 72 hours Thyroid Function Tests: No results found for this basename: TSH, T4TOTAL, FREET4, T3FREE, THYROIDAB,  in the last 72 hours Anemia Panel: No results found for this basename: VITAMINB12, FOLATE, FERRITIN, TIBC, IRON, RETICCTPCT,  in the last 72 hours Coagulation: No results found for this basename: LABPROT, INR,  in the last 72 hours Urine Drug Screen: Drugs of Abuse  No results found for this basename: labopia, cocainscrnur, labbenz, amphetmu, thcu, labbarb    Alcohol Level: No results found for this basename: ETH,  in the last 72 hours Urinalysis: No results found for this basename: COLORURINE, APPERANCEUR, LABSPEC, PHURINE, GLUCOSEU, HGBUR, BILIRUBINUR, KETONESUR, PROTEINUR, UROBILINOGEN, NITRITE, LEUKOCYTESUR,  in the last 72 hours Misc. Labs:  ABGS No results found for this basename: PHART, PCO2, PO2ART, TCO2, HCO3,  in the last 72 hours CULTURES Recent Results (from the past 240 hour(s))  URINE CULTURE     Status: None   Collection Time    11/02/13  2:30 PM      Result Value Ref Range Status   Specimen Description URINE, CATHETERIZED   Final   Special Requests NONE   Final   Culture  Setup Time     Final   Value: 11/02/2013 23:00  Performed at [facility] Count     Final   Value: NO GROWTH     Performed at [facility]   Culture     Final   Value: NO GROWTH     Performed at [facility]   Report Status 11/03/2013 FINAL   Final  WOUND CULTURE     Status: None   Collection Time    11/05/13 10:30 AM      Result Value Ref Range Status   Specimen Description WOUND PERIRECTAL   Final   Special Requests IMMUNE:COMPROMISED   Final   Gram Stain     Final   Value: ABUNDANT WBC PRESENT,BOTH PMN AND MONONUCLEAR     NO SQUAMOUS EPITHELIAL CELLS SEEN      ABUNDANT GRAM POSITIVE COCCI IN PAIRS     RARE GRAM NEGATIVE RODS     Performed at [facility]   Culture     Final   Value: Culture reincubated for better growth     Performed at [facility]   Report Status PENDING   Incomplete   Studies/Results: No results found.  Medications:  Prior to Admission:  Prescriptions prior to admission  Medication Sig Dispense Refill  . doxazosin (CARDURA) 2 MG tablet Take 1 tablet by mouth daily.      Marland Kitchen etodolac (LODINE) 400 MG tablet Take 1 tablet by mouth 2 (two) times daily.      . finasteride (PROSCAR) 5 MG tablet Take 1 tablet by mouth daily.      Marland Kitchen lisinopril (PRINIVIL,ZESTRIL) 10 MG tablet Take 1 tablet by mouth daily.      Marland Kitchen omeprazole (PRILOSEC) 20 MG capsule Take 20 mg by mouth daily.      . potassium chloride SA (K-DUR,KLOR-CON) 20 MEQ tablet Take 20 mEq by mouth 3 (three) times daily.      . predniSONE (DELTASONE) 20 MG tablet Take 20 mg by mouth daily with breakfast.      . torsemide (DEMADEX) 20 MG tablet Take 1 tablet by mouth daily.       Scheduled: . doxycycline  100 mg Oral Q12H  . heparin  5,000 Units Subcutaneous 3 times per day  . IMMUNE GLOBULIN 10% (HUMAN) IV - For Fluid Restriction Only  400 mg/kg Intravenous Q24H  . pantoprazole  40 mg Oral Daily  . potassium chloride SA  40 mEq Oral TID  . predniSONE  10 mg Oral Q breakfast  . sodium chloride  3 mL Intravenous Q12H   Continuous:  [id number], ondansetron (ZOFRAN) IV, ondansetron  Assesment: He has chronic inflammatory demyelinating polyneuropathy and prednisone treatment was attempted. He did not tolerate the prednisone and developed significant edema. He is known to have hypertension. He has an open boil in his scrotal area. He is ready for transfer to skilled care facility but I want to see if we can get his Foley catheter out and see if he can urinate. If he is incontinent then he'll need to have a Foley catheter because it will cause  contamination of his open boil Active Problems:   Bilateral leg edema   HTN (hypertension)   Neuropathy   CIDP (chronic inflammatory demyelinating polyneuropathy)    Plan: He will receive one more dose of IVIG today Foley catheter will be removed and he can be transferred to skilled care facility    LOS: 5 days   Alonza Bogus 11/07/2013, 9:51 AM

## 2013-11-07 NOTE — Discharge Summary (Signed)
Physician Discharge Summary  Patient ID: Dylan Nelson MRN: [id number] DOB/AGE: 78-Jan-1935 48 y.o. Primary Care Physician:Gauge Winski L, MD Admit date: 11/02/2013 Discharge date: 11/07/2013    Discharge Diagnoses:   Active Problems:   Bilateral leg edema   HTN (hypertension)   Neuropathy   CIDP (chronic inflammatory demyelinating polyneuropathy)   Scrotal abscess     Medication List    STOP taking these medications       lisinopril 10 MG tablet  Commonly known as:  PRINIVIL,ZESTRIL      TAKE these medications       docusate sodium 100 MG capsule  Commonly known as:  COLACE  Take 1 capsule (100 mg total) by mouth 2 (two) times daily.     doxazosin 2 MG tablet  Commonly known as:  CARDURA  Take 1 tablet by mouth daily.     doxycycline 100 MG tablet  Commonly known as:  VIBRA-TABS  Take 1 tablet (100 mg total) by mouth every 12 (twelve) hours.     etodolac 400 MG tablet  Commonly known as:  LODINE  Take 1 tablet by mouth 2 (two) times daily.     finasteride 5 MG tablet  Commonly known as:  PROSCAR  Take 1 tablet by mouth daily.     heparin 5000 UNIT/ML injection  Inject 1 mL (5,000 Units total) into the skin every 8 (eight) hours.     HYDROcodone-acetaminophen 5-325 MG per tablet  Commonly known as:  NORCO/VICODIN  Take 1 tablet by mouth every 4 (four) hours as needed for moderate pain.     omeprazole 20 MG capsule  Commonly known as:  PRILOSEC  Take 20 mg by mouth daily.     potassium chloride SA 20 MEQ tablet  Commonly known as:  K-DUR,KLOR-CON  Take 2 tablets (40 mEq total) by mouth 3 (three) times daily.     predniSONE 10 MG tablet  Commonly known as:  DELTASONE  Take 1 tablet (10 mg total) by mouth daily with breakfast.     torsemide 20 MG tablet  Commonly known as:  DEMADEX  Take 1 tablet by mouth daily.        Discharged Condition: Improved    Consults: Urology/wound care  Significant Diagnostic Studies: Mr Cervical Spine Wo  Contrast  11/03/2013   CLINICAL DATA:  Neck pain radiating into both arms. No known injury.  EXAM: MRI CERVICAL SPINE WITHOUT CONTRAST  TECHNIQUE: Multiplanar, multisequence MR imaging of the cervical spine was performed. No intravenous contrast was administered.  COMPARISON:  Cervical spine radiographs 07/13/2013.  FINDINGS: The alignment is grossly stable with reversal of the usual cervical lordosis, a grade 1 anterolisthesis at C4-5 and a grade 1 retrolisthesis at C5-6 and C6-7. There is multilevel spondylosis with endplate degeneration and paraspinal osteophytes. There is no evidence of acute fracture or paraspinal abnormality.  The craniocervical junction appears normal. The cervical cord is normal in signal and caliber. There are bilateral vertebral artery flow voids.  C2-3: There is moderate asymmetric facet hypertrophy on the left contributing to mild left foraminal stenosis. No cord deformity.  C3-4: Moderate asymmetric uncinate spurring and facet hypertrophy on the right contribute to moderate right foraminal stenosis. No cord deformity.  C4-5: Chronic spondylosis with loss of disc height, posterior osteophytes and asymmetric uncinate spurring and facet hypertrophy on the right. The CSF surrounding the cord is effaced. There is severe right and moderate left foraminal stenosis.  C5-6: There is chronic spondylosis with advanced loss of disc  height, posterior osteophytes and bilateral uncinate spurring. The CSF surrounding the cord is effaced. Moderate foraminal narrowing is present bilaterally.  C6-7: There is chronic spondylosis with advanced loss of disc height, posterior osteophytes and bilateral uncinate spurring. The CSF surrounding the cord is effaced. Moderate foraminal narrowing is present bilaterally.  C7-T1: Grade 1 anterolisthesis with asymmetric facet hypertrophy on the left. There is mild left foraminal stenosis. No cord deformity.  IMPRESSION: 1. Multilevel cervical spondylosis as described.  From C4-5 through C6-7, the CSF surrounding the cord is effaced without cord deformity or myelopathic changes. 2. Posterior osteophytes and uncinate spurring contribute to at least moderate foraminal narrowing at multiple levels as detailed above. This may contribute to exiting nerve root encroachment and upper extremity radicular symptoms. 3. No acute findings suspected.   Electronically Signed   By: Camie Patience M.D.   On: 11/03/2013 12:13   US Venous Img Lower Bilateral  11/03/2013   CLINICAL DATA:  Edema.  EXAM: BILATERAL LOWER EXTREMITY VENOUS DOPPLER ULTRASOUND  TECHNIQUE: Gray-scale sonography with graded compression, as well as color Doppler and duplex ultrasound were performed to evaluate the lower extremity deep venous systems from the level of the common femoral vein and including the common femoral, femoral, profunda femoral, popliteal and calf veins including the posterior tibial, peroneal and gastrocnemius veins when visible. The superficial great saphenous vein was also interrogated. Spectral Doppler was utilized to evaluate flow at rest and with distal augmentation maneuvers in the common femoral, femoral and popliteal veins.  COMPARISON:  None.  FINDINGS: RIGHT LOWER EXTREMITY  Common Femoral Vein: No evidence of thrombus. Normal compressibility, respiratory phasicity and response to augmentation.  Saphenofemoral Junction: No evidence of thrombus. Normal compressibility and flow on color Doppler imaging.  Profunda Femoral Vein: No evidence of thrombus. Normal compressibility and flow on color Doppler imaging.  Femoral Vein: No evidence of thrombus. Normal compressibility, respiratory phasicity and response to augmentation.  Popliteal Vein: No evidence of thrombus. Normal compressibility, respiratory phasicity and response to augmentation.  Calf Veins: No evidence of thrombus. Normal compressibility and flow on color Doppler imaging.  Superficial Great Saphenous Vein: No evidence of thrombus.  Normal compressibility and flow on color Doppler imaging.  Venous Reflux:  None.  Other Findings:  None.  LEFT LOWER EXTREMITY  Common Femoral Vein: No evidence of thrombus. Normal compressibility, respiratory phasicity and response to augmentation.  Saphenofemoral Junction: No evidence of thrombus. Normal compressibility and flow on color Doppler imaging.  Profunda Femoral Vein: No evidence of thrombus. Normal compressibility and flow on color Doppler imaging.  Femoral Vein: No evidence of thrombus. Normal compressibility, respiratory phasicity and response to augmentation.  Popliteal Vein: No evidence of thrombus. Normal compressibility, respiratory phasicity and response to augmentation.  Calf Veins: No evidence of thrombus. Normal compressibility and flow on color Doppler imaging.  Superficial Great Saphenous Vein: No evidence of thrombus. Normal compressibility and flow on color Doppler imaging.  Venous Reflux:  None.  Other Findings:  None.  IMPRESSION: No evidence of deep venous thrombosis.   Electronically Signed   By: [place]   On: 11/03/2013 11:10   Dg Chest Portable 1 View  11/02/2013   CLINICAL DATA:  Leg and feet edema for more than 1 week.  EXAM: PORTABLE CHEST - 1 VIEW  COMPARISON:  02/24/2004 radiographs.  FINDINGS: The heart size is at the upper limits of normal for AP technique. The lungs are clear. There is no pleural effusion or pneumothorax. No acute osseous findings are  demonstrated. Telemetry leads overlie the chest.  IMPRESSION: Stable chest.  No acute cardiopulmonary process.   Electronically Signed   By: Camie Patience M.D.   On: 11/02/2013 12:55    Lab Results: Basic Metabolic Panel:  Recent Labs  11/05/13 0531 11/06/13 0507  NA 131* 130*  K 3.3* 4.0  CL 91* 92*  CO2 28 28  GLUCOSE 162* 118*  BUN 24* 22  CREATININE 0.69 0.66  CALCIUM 8.7 8.6   Liver Function Tests: No results found for this basename: AST, ALT, ALKPHOS, BILITOT, PROT, ALBUMIN,  in the last 72  hours   CBC: No results found for this basename: WBC, NEUTROABS, HGB, HCT, MCV, PLT,  in the last 72 hours  Recent Results (from the past 240 hour(s))  URINE CULTURE     Status: None   Collection Time    11/02/13  2:30 PM      Result Value Ref Range Status   Specimen Description URINE, CATHETERIZED   Final   Special Requests NONE   Final   Culture  Setup Time     Final   Value: 11/02/2013 23:00     Performed at [facility]     Final   Value: NO GROWTH     Performed at [facility]   Culture     Final   Value: NO GROWTH     Performed at [facility]   Report Status 11/03/2013 FINAL   Final  WOUND CULTURE     Status: None   Collection Time    11/05/13 10:30 AM      Result Value Ref Range Status   Specimen Description WOUND PERIRECTAL   Final   Special Requests IMMUNE:COMPROMISED   Final   Gram Stain     Final   Value: ABUNDANT WBC PRESENT,BOTH PMN AND MONONUCLEAR     NO SQUAMOUS EPITHELIAL CELLS SEEN     ABUNDANT GRAM POSITIVE COCCI IN PAIRS     RARE GRAM NEGATIVE RODS     Performed at [facility]   Culture     Final   Value: Culture reincubated for better growth     Performed at [facility] Course: He came to the emergency department because of extreme leg swelling. He had been placed on high-dose prednisone in attempt to treat CIDP. He took diuretics but could not get his leg edema go down. This caused him not to be able to ambulate. He has been increasingly weak because of his polyneuropathy. His prednisone was discontinued he was placed on IV diuretics and started on IVIG after neurology consultation. He had a tender area in his scrotum that ruptured while he was in the hospital and appears to be a scrotal abscess. Culture is pending at this time. He had physical therapy evaluation and it was felt that he's going to require skilled care facility for rehabilitation and  this is going to be accomplished. He has now received 5 doses of IVIG.  Discharge Exam: Blood pressure 116/68, pulse 98, temperature 98.4 F (36.9 C), temperature source Oral, resp. rate 18, height 6\' 1"  (1.854 m), weight 101.6 kg (223 lb 15.8 oz), SpO2 98.00%. He is awake and alert. He is obese. He is weak. His chest is clear. His heart is regular. The scrotal abscess is dry.  Disposition: He will have PT OT and speech if needed. He  will be on a no added salt diet. He may need to have Foley catheter if he is incontinent to keep him from contaminating the scrotal wound. He needs to have basic metabolic profile on 85/92/9244      Discharge Instructions   Discharge to SNF when bed available    Complete by:  As directed   After todays dose of IVIG             Signed: Alonza Bogus   11/07/2013, 9:55 AM

## 2013-11-09 LAB — WOUND CULTURE

## 2013-11-11 ENCOUNTER — Ambulatory Visit (HOSPITAL_COMMUNITY): Payer: Medicare Other

## 2013-11-11 LAB — MYASTHENIA GRAVIS PANEL 2: Acetylcholine Modulat Ab: 16 %

## 2013-11-12 ENCOUNTER — Ambulatory Visit (HOSPITAL_COMMUNITY)
Admission: RE | Admit: 2013-11-12 | Discharge: 2013-11-12 | Disposition: A | Discharge status: Home / Self Care | Payer: Medicare Other | Source: Ambulatory Visit | Attending: Internal Medicine | Admitting: Internal Medicine

## 2013-11-12 DIAGNOSIS — L02219 Cutaneous abscess of trunk, unspecified: Secondary | ICD-10-CM | POA: Insufficient documentation

## 2013-11-12 DIAGNOSIS — L03319 Cellulitis of trunk, unspecified: Principal | ICD-10-CM

## 2013-11-12 MED ORDER — SODIUM CHLORIDE 0.9 % IJ SOLN
10.0000 mL | INTRAMUSCULAR | Status: DC | PRN
Start: 2013-11-12 — End: 2013-11-13

## 2013-11-12 NOTE — Discharge Instructions (Signed)
Peripherally Inserted Central Catheter/Midline Placement  The IV Nurse has discussed with the patient and/or persons authorized to consent for the patient, the purpose of this procedure and the potential benefits and risks involved with this procedure.  The benefits include less needle sticks, lab draws from the catheter and patient may be discharged home with the catheter.  Risks include, but not limited to, infection, bleeding, blood clot (thrombus formation), and puncture of an artery; nerve damage and irregular heat beat.  Alternatives to this procedure were also discussed.  PICC/Midline Placement Documentation  PICC / Midline Single Lumen 09/81/19 PICC Right Basilic 44 cm 1 cm (Active)  Indication for Insertion or Continuance of Line Home intravenous therapies (PICC only) 11/12/2013 10:51 AM  Exposed Catheter (cm) 1 cm 11/12/2013 10:51 AM  Site Assessment Clean;Dry;Intact 11/12/2013 10:51 AM  Line Status Flushed;Saline locked;Blood return noted 11/12/2013 10:51 AM  Dressing Type Transparent;Securing device 11/12/2013 10:51 AM  Dressing Status Clean;Dry;Intact;Antimicrobial disc in place 11/12/2013 10:51 AM  Line Care Connections checked and tightened 11/12/2013 10:51 AM  Dressing Intervention New dressing 11/12/2013 10:51 AM  Dressing Change Due 11/19/13 11/12/2013 10:51 AM    PICC Insertion, Care After Refer to this sheet in the next few weeks. These instructions provide you with information on caring for yourself after your procedure. Your health care provider may also give you more specific instructions. Your treatment has been planned according to current medical practices, but problems sometimes occur. Call your health care provider if you have any problems or questions after your procedure. WHAT TO EXPECT AFTER THE PROCEDURE After your procedure, it is typical to have the following:  Mild discomfort at the insertion site. This should not last more than a day. HOME CARE INSTRUCTIONS  Rest  at home for the remainder of the day after the procedure.  You may bend your arm and move it freely. If your PICC is near or at the bend of your elbow, avoid activity with repeated motion at the elbow.  Avoid lifting heavy objects as instructed by your health care provider.  Avoid using a crutch with the arm on the same side as your PICC. You may need to use a walker. Bandage Care  Keep your PICC bandage (dressing) clean and dry to prevent infection.  Ask your health care provider when you may shower. To keep the dressing dry, cover the PICC with plastic wrap and tape before showering. If the dressing does become wet, replace it right after the shower.  Do not soak in the bath, swim, or use hot tubs when you have a PICC.  Change the PICC dressing as instructed by your health care provider.  Change your PICC dressing if it becomes loose or wet. General PICC Care  Check the PICC insertion site daily for leakage, redness, swelling, or pain.  Flush the PICC as directed by your health care provider. Let your health care provider know right away if the PICC is difficult to flush or does not flush. Do not use force to flush the PICC.   Do not use a syringe that is less than 10 mL to flush the PICC.  Never pull or tug on the PICC.  Avoid blood pressure checks on the arm with the PICC.  Keep your PICC identification card with you at all times.  Do not take the PICC out yourself. Only a trained health care professional should remove the PICC.  SEEK MEDICAL CARE IF:  You have pain in your arm, ear, face,  or teeth.  You have fever or chills.  You have drainage from the PICC insertion site.  You have redness or palpate a "cord" around the PICC insertion site.  You cannot flush the catheter. SEEK IMMEDIATE MEDICAL CARE IF:  You have swelling in the arm in which the PICC is inserted. Document Released: 03/25/2013 Document Reviewed: 02/09/2013 [facility] Patient Information 2014  [place], [place].   PICC Home Guide A peripherally inserted central catheter (PICC) is a long, thin, flexible tube that is inserted into a vein in the upper arm. It is a form of intravenous (IV) access. It is considered to be a "central" line because the tip of the PICC ends in a large vein in your chest. This large vein is called the superior vena cava (SVC). The PICC tip ends in the SVC because there is a lot of blood flow in the SVC. This allows medicines and IV fluids to be quickly distributed throughout the body. The PICC is inserted using a sterile technique by a specially trained nurse or physician. After the PICC is inserted, a chest X-ray exam is done to be sure it is in the correct place.  A PICC may be placed for different reasons, such as:  To give medicines and liquid nutrition that can only be given through a central line. Examples are:  Certain antibiotic treatments.  Chemotherapy.  Total parenteral nutrition (TPN).  To take frequent blood samples.  To give IV fluids and blood products.  If there is difficulty placing a peripheral intravenous (PIV) catheter. If taken care of properly, a PICC can remain in place for several months. A PICC can also allow a person to go home from the hospital early. Medicine and PICC care can be managed at home by a family member or home health care team. [place] A PICC? Problems with a PICC can occasionally occur. These may include:  A blood clot (thrombus) forming in or at the tip of the PICC. This can cause the PICC to become clogged. A clot-dissolving medicine called tissue plasminogen activator (tPA) can be given through the PICC to help break up the clot.  Inflammation of the vein (phlebitis) in which the PICC is placed. Signs of inflammation may include redness, pain at the insertion site, red streaks, or being able to feel a "cord" in the vein where the PICC is located.  Infection in the PICC or at the  insertion site. Signs of infection may include fever, chills, redness, swelling, or pus drainage from the PICC insertion site.  PICC movement (malposition). The PICC tip may move from its original position due to excessive physical activity, forceful coughing, sneezing, or vomiting.  A break or cut in the PICC. It is important to not use scissors near the PICC.  Nerve or tendon irritation or injury during PICC insertion. WHAT SHOULD I KEEP IN MIND ABOUT ACTIVITIES WHEN I HAVE A PICC?  You may bend your arm and move it freely. If your PICC is near or at the bend of your elbow, avoid activity with repeated motion at the elbow.  Rest at home for the remainder of the day following PICC line insertion.  Avoid lifting heavy objects as instructed by your health care provider.  Avoid using a crutch with the arm on the same side as your PICC. You may need to use a walker. WHAT SHOULD I KNOW ABOUT MY PICC DRESSING?  Keep your PICC bandage (dressing) clean and dry to  prevent infection.  Ask your health care provider when you may shower. Ask your health care provider to teach you how to wrap the PICC when you do take a shower.  Change the PICC dressing as instructed by your health care provider.  Change your PICC dressing if it becomes loose or wet. WHAT SHOULD I KNOW ABOUT PICC CARE?  Check the PICC insertion site daily for leakage, redness, swelling, or pain.  Do not take a bath, swim, or use hot tubs when you have a PICC. Cover PICC line with clear plastic wrap and tape to keep it dry while showering.  Flush the PICC as directed by your health care provider. Let your health care provider know right away if the PICC is difficult to flush or does not flush. Do not use force to flush the PICC.  Do not use a syringe that is less than 10 mL to flush the PICC.  Never pull or tug on the PICC.  Avoid blood pressure checks on the arm with the PICC.  Keep your PICC identification card with you at  all times.  Do not take the PICC out yourself. Only a trained clinical professional should remove the PICC. SEEK IMMEDIATE MEDICAL CARE IF:  Your PICC is accidently pulled all the way out. If this happens, cover the insertion site with a bandage or gauze dressing. Do not throw the PICC away. Your health care provider will need to inspect it.  Your PICC was tugged or pulled and has partially come out. Do not  push the PICC back in.  There is any type of drainage, redness, or swelling where the PICC enters the skin.  You cannot flush the PICC, it is difficult to flush, or the PICC leaks around the insertion site when it is flushed.  You hear a "flushing" sound when the PICC is flushed.  You have pain, discomfort, or numbness in your arm, shoulder, or jaw on the same side as the PICC.  You feel your heart "racing" or skipping beats.  You notice a hole or tear in the PICC.  You develop chills or a fever. MAKE SURE YOU:   Understand these instructions.  Will watch your condition.  Will get help right away if you are not doing well or get worse. Document Released: 12/09/2002 Document Revised: 03/25/2013 Document Reviewed: 02/09/2013 [facility] Patient Information 2014 [place].   If you have any questions or concerns about the PICC, please contact [place] at [phone].

## 2013-11-13 ENCOUNTER — Ambulatory Visit (HOSPITAL_COMMUNITY): Payer: Medicare Other

## 2013-11-13 NOTE — Progress Notes (Signed)
  Patient Details  Name: Dylan Nelson MRN: [id number] Date of Birth: 14-Apr-1934  Today's Date: 11/13/2013  Patient discharge due to change in medical status.  Shanay Woolman R Eleni Frank 11/13/2013, 11:22 AM

## 2013-11-24 ENCOUNTER — Ambulatory Visit (INDEPENDENT_AMBULATORY_CARE_PROVIDER_SITE_OTHER): Payer: Medicare Other | Admitting: Urology

## 2013-11-24 DIAGNOSIS — C61 Malignant neoplasm of prostate: Secondary | ICD-10-CM

## 2013-11-24 DIAGNOSIS — N400 Enlarged prostate without lower urinary tract symptoms: Secondary | ICD-10-CM

## 2013-11-27 ENCOUNTER — Other Ambulatory Visit (HOSPITAL_COMMUNITY): Payer: Self-pay | Admitting: Internal Medicine

## 2013-11-27 DIAGNOSIS — R1310 Dysphagia, unspecified: Secondary | ICD-10-CM

## 2013-12-04 ENCOUNTER — Ambulatory Visit (HOSPITAL_COMMUNITY)
Admission: RE | Admit: 2013-12-04 | Discharge: 2013-12-04 | Disposition: A | Discharge status: Home / Self Care | Payer: Medicare Other | Source: Ambulatory Visit | Attending: Internal Medicine | Admitting: Internal Medicine

## 2013-12-04 DIAGNOSIS — R1310 Dysphagia, unspecified: Secondary | ICD-10-CM | POA: Insufficient documentation

## 2013-12-04 DIAGNOSIS — G589 Mononeuropathy, unspecified: Secondary | ICD-10-CM | POA: Insufficient documentation

## 2013-12-04 DIAGNOSIS — IMO0001 Reserved for inherently not codable concepts without codable children: Secondary | ICD-10-CM | POA: Insufficient documentation

## 2013-12-04 DIAGNOSIS — N400 Enlarged prostate without lower urinary tract symptoms: Secondary | ICD-10-CM | POA: Insufficient documentation

## 2013-12-04 DIAGNOSIS — I10 Essential (primary) hypertension: Secondary | ICD-10-CM | POA: Insufficient documentation

## 2013-12-04 DIAGNOSIS — R1311 Dysphagia, oral phase: Secondary | ICD-10-CM | POA: Insufficient documentation

## 2013-12-04 NOTE — Procedures (Signed)
Objective Swallowing Evaluation: Modified Barium Swallowing Study  Patient Details  Name: Dylan Nelson MRN: [id number] Date of Birth: 1933/08/08  Today's Date: 12/04/2013 Time: 10:15 AM  - 11:00 AM    Past Medical History:  Past Medical History  Diagnosis Date  . Hypertension   . Neuropathy   . Prostate enlargement    Past Surgical History: No past surgical history on file. HPI:  Dylan Nelson is a 80 year old male who was referred for MBSS by Dr. Maudie Mercury. Dylan Nelson was discharged from [facility] several weeks ago following admission for extreme leg swelling. He had been placed on high-dose prednisone in attempt to treat CIDP. He took diuretics but could not get his leg edema go down. This caused him not to be able to ambulate. He has been increasingly weak because of his polyneuropathy. His prednisone was discontinued he was placed on IV diuretics and started on IVIG after neurology consultation. He had a tender area in his scrotum that ruptured while he was in the hospital and appears to be a scrotal abscess. He has been at [facility] for rehab following the hospital admision.      Recommendation/Prognosis  Clinical Impression:   Dysphagia Diagnosis: Mild oral phase dysphagia;Mild pharyngeal phase dysphagia;Moderate cervical esophageal phase dysphagia;Suspected primary esophageal dysphagia  Clinical impression: Pt presents with mild oropharyngeal phase dysphagia, mild cervical esophageal phase, and suspected mild primary esophageal phase (delayed emptying of puree with stasis throughout esophagus). Oral phase is characterized by poor tongue movement and chewing in setting of upper and lower dentures resulting delayed oral transit, piecemeal deglutition, and lingual and palatal residue. Suspect decreased lingual pressure/approximation with soft palate due to dentures. Residuals noted where upper dentures meet soft palate. Pharyngeal phase was essentially [facility] with the exception of mild CP residuals until  the pt was given a barium tablet. The barium tablet was taken with thin liquids and traversed to the distal esophagus. Due to pt body habitus, we could not visualize whether the pill passed through LES, however sequential swallows of straw sips thin after taking the pill yielded retrograde movement of liquids back to cervical esophagus. Increased residuals of thin at CP segment noted and pt penetrated and trace aspirated (to the cords but expelled) thins when taking sequential swallows (epiglottis remained inverted and likely decreased hyolaryngeal excursion to clear UES). Pt was given ~2 ounces puree which resulted in prolonged retention in esophagus, but did eventually clear. Recommend: Mechanical soft textures with thin liquids and crush or break large pills as able. Avoid use of straws and multiple/sequential swallows. Repeat dry swallows periodically and implement standard aspiration and reflux precautions.    Swallow Evaluation Recommendations:  Recommended Consults: Consider esophageal assessment Diet Recommendations: Dysphagia 3 (Mechanical Soft);Thin liquid Liquid Administration via: Cup Medication Administration: Crushed with puree (or break large pills as able) Supervision: Patient able to self feed;Intermittent supervision to cue for compensatory strategies Compensations: Small sips/bites;Multiple dry swallows after each bite/sip;Effortful swallow Postural Changes and/or Swallow Maneuvers: Out of bed for meals;Seated upright 90 degrees;Upright 30-60 min after meal Oral Care Recommendations: Oral care BID Other Recommendations: Clarify dietary restrictions Follow up Recommendations: Skilled Nursing facility    Prognosis:   Good   Individuals Consulted: Consulted and Agree with Results and Recommendations: Patient Report Sent to : Referring physician;Facility (Comment) ([facility])     General: Date of Onset: 11/25/13 Type of Study: Modified Barium Swallowing Study Reason for Referral:  Objectively evaluate swallowing function Previous Swallow Assessment: None on record Diet Prior  to this Study: Information not available Temperature Spikes Noted: No Respiratory Status: Room air History of Recent Intubation: No Behavior/Cognition: Alert;Cooperative;Pleasant mood Oral Cavity - Dentition: Dentures, top;Dentures, bottom Oral Motor / Sensory Function: Within functional limits Self-Feeding Abilities: Able to feed self Patient Positioning: Upright in chair Baseline Vocal Quality: Clear Volitional Cough: Strong Volitional Swallow: Able to elicit Anatomy: Within functional limits Pharyngeal Secretions: Not observed secondary MBS   Reason for Referral:   Objectively evaluate swallowing function    Oral Phase: Oral Preparation/Oral Phase Oral Phase: Impaired Oral - Thin Oral - Thin Cup: Lingual/palatal residue;Piecemeal swallowing Oral - Thin Straw: Lingual/palatal residue;Piecemeal swallowing Oral - Solids Oral - Puree: Lingual/palatal residue;Piecemeal swallowing;Delayed oral transit Oral - Regular: Lingual/palatal residue;Piecemeal swallowing;Delayed oral transit   Pharyngeal Phase:  Pharyngeal Phase Pharyngeal Phase: Impaired Pharyngeal - Thin Pharyngeal - Thin Cup: Within functional limits Pharyngeal - Thin Straw: Pharyngeal residue - cp segment;Pharyngeal residue - valleculae;Pharyngeal residue - pyriform sinuses;Lateral channel residue;Penetration/Aspiration after swallow Penetration/Aspiration details (thin straw): Material enters airway, CONTACTS cords then ejected out;Material does not enter airway;Material enters airway, remains ABOVE vocal cords then ejected out Pharyngeal - Solids Pharyngeal - Puree: Pharyngeal residue - cp segment Pharyngeal - Regular: Pharyngeal residue - cp segment Pharyngeal - Pill: Pharyngeal residue - cp segment (residuals of liquid at CP segment) Pharyngeal Phase - Comment Pharyngeal Comment: Increased residuals of liquids at CP  segement and pyriforms when taking multiple/sequential swallows- epiglottis stays inverted.   Cervical Esophageal Phase  Cervical Esophageal Phase Cervical Esophageal Phase: Impaired Cervical Esophageal Phase - Thin Thin Straw: Reduced cricopharyngeal relaxation;Esophageal backflow into the pharynx Cervical Esophageal Phase - Solids Puree: Other (Comment) (stasis of bolus throughout esophagus, eventually clears) Cervical Esophageal Phase - Comment Cervical Esophageal Comment: see Clinical Impression statement   GN  Functional Assessment Tool Used: MBSS Functional Limitations: Swallowing Swallow Current Status ([id number]): At least 20 percent but less than 40 percent impaired, limited or restricted Swallow Goal Status [phone]): At least 1 percent but less than 20 percent impaired, limited or restricted Swallow Discharge Status [phone]): At least 20 percent but less than 40 percent impaired, limited or restricted     Thank you,  Genene Churn, [place]   [place] 12/04/2013, 12:19 PM

## 2014-01-18 ENCOUNTER — Other Ambulatory Visit: Payer: Self-pay

## 2014-01-18 ENCOUNTER — Emergency Department (HOSPITAL_COMMUNITY): Payer: Medicare Other

## 2014-01-18 ENCOUNTER — Encounter (HOSPITAL_COMMUNITY): Payer: Self-pay | Admitting: Emergency Medicine

## 2014-01-18 ENCOUNTER — Inpatient Hospital Stay (HOSPITAL_COMMUNITY)
Admission: EM | Admit: 2014-01-18 | Discharge: 2014-01-21 | DRG: 281 | Disposition: A | Discharge status: Skilled Nursing Facility | Payer: Medicare Other | Attending: Pulmonary Disease | Admitting: Pulmonary Disease

## 2014-01-18 DIAGNOSIS — R079 Chest pain, unspecified: Secondary | ICD-10-CM | POA: Diagnosis present

## 2014-01-18 DIAGNOSIS — G629 Polyneuropathy, unspecified: Secondary | ICD-10-CM

## 2014-01-18 DIAGNOSIS — I214 Non-ST elevation (NSTEMI) myocardial infarction: Principal | ICD-10-CM

## 2014-01-18 DIAGNOSIS — G589 Mononeuropathy, unspecified: Secondary | ICD-10-CM

## 2014-01-18 DIAGNOSIS — Z8249 Family history of ischemic heart disease and other diseases of the circulatory system: Secondary | ICD-10-CM

## 2014-01-18 DIAGNOSIS — I119 Hypertensive heart disease without heart failure: Secondary | ICD-10-CM

## 2014-01-18 DIAGNOSIS — I10 Essential (primary) hypertension: Secondary | ICD-10-CM

## 2014-01-18 DIAGNOSIS — R600 Localized edema: Secondary | ICD-10-CM

## 2014-01-18 DIAGNOSIS — I77810 Thoracic aortic ectasia: Secondary | ICD-10-CM

## 2014-01-18 DIAGNOSIS — D72829 Elevated white blood cell count, unspecified: Secondary | ICD-10-CM

## 2014-01-18 DIAGNOSIS — G6181 Chronic inflammatory demyelinating polyneuritis: Secondary | ICD-10-CM

## 2014-01-18 DIAGNOSIS — R0789 Other chest pain: Secondary | ICD-10-CM | POA: Diagnosis not present

## 2014-01-18 DIAGNOSIS — R7989 Other specified abnormal findings of blood chemistry: Secondary | ICD-10-CM | POA: Diagnosis present

## 2014-01-18 DIAGNOSIS — R778 Other specified abnormalities of plasma proteins: Secondary | ICD-10-CM | POA: Diagnosis present

## 2014-01-18 DIAGNOSIS — Z87891 Personal history of nicotine dependence: Secondary | ICD-10-CM

## 2014-01-18 DIAGNOSIS — R262 Difficulty in walking, not elsewhere classified: Secondary | ICD-10-CM

## 2014-01-18 DIAGNOSIS — N390 Urinary tract infection, site not specified: Secondary | ICD-10-CM

## 2014-01-18 DIAGNOSIS — G709 Myoneural disorder, unspecified: Secondary | ICD-10-CM | POA: Diagnosis present

## 2014-01-18 DIAGNOSIS — K5900 Constipation, unspecified: Secondary | ICD-10-CM | POA: Diagnosis present

## 2014-01-18 DIAGNOSIS — N400 Enlarged prostate without lower urinary tract symptoms: Secondary | ICD-10-CM

## 2014-01-18 DIAGNOSIS — I498 Other specified cardiac arrhythmias: Secondary | ICD-10-CM | POA: Diagnosis present

## 2014-01-18 HISTORY — DX: Unspecified abnormalities of gait and mobility: R26.9

## 2014-01-18 HISTORY — DX: Chronic inflammatory demyelinating polyneuritis: G61.81

## 2014-01-18 HISTORY — DX: Other cervical disc degeneration, unspecified cervical region: M50.30

## 2014-01-18 LAB — CBC WITH DIFFERENTIAL/PLATELET
Basophils Absolute: 0 10*3/uL (ref 0.0–0.1)
Basophils Relative: 0 % (ref 0–1)
EOS ABS: 0 10*3/uL (ref 0.0–0.7)
Eosinophils Relative: 0 % (ref 0–5)
HCT: 39.6 % (ref 39.0–52.0)
HEMOGLOBIN: 12.7 g/dL — AB (ref 13.0–17.0)
LYMPHS ABS: 1.5 10*3/uL (ref 0.7–4.0)
LYMPHS PCT: 11 % — AB (ref 12–46)
MCH: 29.1 pg (ref 26.0–34.0)
MCHC: 32.1 g/dL (ref 30.0–36.0)
MCV: 90.8 fL (ref 78.0–100.0)
MONO ABS: 0.6 10*3/uL (ref 0.1–1.0)
Monocytes Relative: 5 % (ref 3–12)
Neutro Abs: 11.7 10*3/uL — ABNORMAL HIGH (ref 1.7–7.7)
Neutrophils Relative %: 84 % — ABNORMAL HIGH (ref 43–77)
Platelets: 282 10*3/uL (ref 150–400)
RBC: 4.36 MIL/uL (ref 4.22–5.81)
RDW: 14.6 % (ref 11.5–15.5)
WBC: 13.8 10*3/uL — AB (ref 4.0–10.5)

## 2014-01-18 LAB — BASIC METABOLIC PANEL
Anion gap: 12 (ref 5–15)
BUN: 25 mg/dL — AB (ref 6–23)
CALCIUM: 9.6 mg/dL (ref 8.4–10.5)
CO2: 28 meq/L (ref 19–32)
Chloride: 97 mEq/L (ref 96–112)
Creatinine, Ser: 0.59 mg/dL (ref 0.50–1.35)
GFR calc Af Amer: >90 mL/min (ref >90)
GLUCOSE: 247 mg/dL — AB (ref 70–99)
Potassium: 4.6 mEq/L (ref 3.7–5.3)
Sodium: 137 mEq/L (ref 137–147)

## 2014-01-18 LAB — TROPONIN I
Troponin I: <0.3 ng/mL (ref <0.30)
Troponin I: 0.4 ng/mL (ref <0.30)
Troponin I: 0.53 ng/mL (ref <0.30)
Troponin I: 0.6 ng/mL (ref <0.30)

## 2014-01-18 LAB — PROTIME-INR
INR: 1.01 (ref 0.00–1.49)
Prothrombin Time: 13.3 seconds (ref 11.6–15.2)

## 2014-01-18 LAB — MRSA PCR SCREENING: MRSA by PCR: NEGATIVE

## 2014-01-18 MED ORDER — ACETAMINOPHEN 325 MG PO TABS
650.0000 mg | ORAL_TABLET | ORAL | Status: DC | PRN
  Administered 2014-01-19 – 2014-01-21 (×5): 650 mg via ORAL
  Filled 2014-01-18 (×6): qty 2

## 2014-01-18 MED ORDER — ENSURE COMPLETE PO LIQD
237.0000 mL | Freq: Two times a day (BID) | ORAL | Status: DC
  Administered 2014-01-18 – 2014-01-21 (×6): 237 mL via ORAL

## 2014-01-18 MED ORDER — PANTOPRAZOLE SODIUM 40 MG PO TBEC
40.0000 mg | DELAYED_RELEASE_TABLET | Freq: Every day | ORAL | Status: DC
  Administered 2014-01-19 – 2014-01-21 (×3): 40 mg via ORAL
  Filled 2014-01-18 (×3): qty 1

## 2014-01-18 MED ORDER — SODIUM CHLORIDE 0.9 % IV BOLUS (SEPSIS)
250.0000 mL | Freq: Once | INTRAVENOUS | Status: AC
  Administered 2014-01-18: 250 mL via INTRAVENOUS

## 2014-01-18 MED ORDER — SODIUM CHLORIDE 0.9 % IV SOLN: 250.0000 mL | INTRAVENOUS | Status: DC | PRN

## 2014-01-18 MED ORDER — SODIUM CHLORIDE 0.9 % IJ SOLN
3.0000 mL | INTRAMUSCULAR | Status: DC | PRN
Start: 2014-01-18 — End: 2014-01-21

## 2014-01-18 MED ORDER — TORSEMIDE 20 MG PO TABS
20.0000 mg | ORAL_TABLET | Freq: Every day | ORAL | Status: DC
  Administered 2014-01-19 – 2014-01-21 (×3): 20 mg via ORAL
  Filled 2014-01-18 (×5): qty 1

## 2014-01-18 MED ORDER — HEPARIN SODIUM (PORCINE) 5000 UNIT/ML IJ SOLN: 5000.0000 [IU] | Freq: Three times a day (TID) | INTRAMUSCULAR | Status: DC

## 2014-01-18 MED ORDER — PREDNISONE 10 MG PO TABS
10.0000 mg | ORAL_TABLET | Freq: Every day | ORAL | Status: DC
  Administered 2014-01-19 – 2014-01-21 (×3): 10 mg via ORAL
  Filled 2014-01-18 (×3): qty 1

## 2014-01-18 MED ORDER — FINASTERIDE 5 MG PO TABS
5.0000 mg | ORAL_TABLET | Freq: Every day | ORAL | Status: DC
  Administered 2014-01-19 – 2014-01-21 (×3): 5 mg via ORAL
  Filled 2014-01-18 (×4): qty 1

## 2014-01-18 MED ORDER — METOPROLOL TARTRATE 1 MG/ML IV SOLN: 5.0000 mg | INTRAVENOUS | Status: DC | PRN

## 2014-01-18 MED ORDER — SERTRALINE HCL 50 MG PO TABS
25.0000 mg | ORAL_TABLET | Freq: Every day | ORAL | Status: DC
  Administered 2014-01-19 – 2014-01-21 (×3): 25 mg via ORAL
  Filled 2014-01-18 (×6): qty 1

## 2014-01-18 MED ORDER — HEPARIN (PORCINE) IN NACL 100-0.45 UNIT/ML-% IJ SOLN
1400.0000 [IU]/h | INTRAMUSCULAR | Status: DC
  Administered 2014-01-18 – 2014-01-19 (×2): 1400 [IU]/h via INTRAVENOUS
  Filled 2014-01-18 (×2): qty 250

## 2014-01-18 MED ORDER — POTASSIUM CHLORIDE CRYS ER 20 MEQ PO TBCR
40.0000 meq | EXTENDED_RELEASE_TABLET | Freq: Every day | ORAL | Status: DC
  Administered 2014-01-19 – 2014-01-21 (×3): 40 meq via ORAL
  Filled 2014-01-18 (×4): qty 2

## 2014-01-18 MED ORDER — PRO-STAT SUGAR FREE PO LIQD
30.0000 mL | Freq: Two times a day (BID) | ORAL | Status: DC
  Administered 2014-01-18 – 2014-01-21 (×6): 30 mL via ORAL
  Filled 2014-01-18 (×6): qty 30

## 2014-01-18 MED ORDER — POTASSIUM CHLORIDE CRYS ER 20 MEQ PO TBCR
40.0000 meq | EXTENDED_RELEASE_TABLET | Freq: Every day | ORAL | Status: DC
Start: 2014-01-18 — End: 2014-01-18

## 2014-01-18 MED ORDER — PANTOPRAZOLE SODIUM 40 MG PO TBEC: 40.0000 mg | DELAYED_RELEASE_TABLET | Freq: Every day | ORAL | Status: DC

## 2014-01-18 MED ORDER — ASPIRIN 81 MG PO CHEW
324.0000 mg | CHEWABLE_TABLET | Freq: Once | ORAL | Status: AC
  Administered 2014-01-18: 324 mg via ORAL
  Filled 2014-01-18: qty 4

## 2014-01-18 MED ORDER — HEPARIN BOLUS VIA INFUSION
4000.0000 [IU] | Freq: Once | INTRAVENOUS | Status: AC
  Administered 2014-01-18: 4000 [IU] via INTRAVENOUS
  Filled 2014-01-18: qty 4000

## 2014-01-18 MED ORDER — LORAZEPAM 0.5 MG PO TABS
0.5000 mg | ORAL_TABLET | Freq: Four times a day (QID) | ORAL | Status: DC | PRN
Start: 2014-01-18 — End: 2014-01-21

## 2014-01-18 MED ORDER — DOXAZOSIN MESYLATE 2 MG PO TABS: 2.0000 mg | ORAL_TABLET | Freq: Every day | ORAL | Status: DC

## 2014-01-18 MED ORDER — ONDANSETRON HCL 4 MG/2ML IJ SOLN: 4.0000 mg | Freq: Four times a day (QID) | INTRAMUSCULAR | Status: DC | PRN

## 2014-01-18 MED ORDER — HEPARIN SODIUM (PORCINE) 5000 UNIT/ML IJ SOLN
5000.0000 [IU] | Freq: Three times a day (TID) | INTRAMUSCULAR | Status: DC
Start: 2014-01-18 — End: 2014-01-18

## 2014-01-18 MED ORDER — SODIUM CHLORIDE 0.9 % IJ SOLN
3.0000 mL | Freq: Two times a day (BID) | INTRAMUSCULAR | Status: DC
  Administered 2014-01-19 – 2014-01-21 (×5): 3 mL via INTRAVENOUS

## 2014-01-18 MED ORDER — SODIUM CHLORIDE 0.9 % IV SOLN
INTRAVENOUS | Status: DC
  Administered 2014-01-18 – 2014-01-20 (×4): via INTRAVENOUS

## 2014-01-18 NOTE — Progress Notes (Signed)
ANTICOAGULATION CONSULT NOTE - Initial Consult  Pharmacy Consult for Heparin Indication: chest pain/ACS  No Known Allergies  Patient Measurements: Height: 6\' 1"  (185.4 cm) Weight: 220 lb (99.79 kg) IBW/kg (Calculated) : 79.9 Heparin Dosing Weight: 99.8 kg  Vital Signs: Temp: 98.5 F (36.9 C) (08/03 1427) BP: 112/79 mmHg (08/03 1603) Pulse Rate: 89 (08/03 1603)  Labs:  Recent Labs  01/18/14 1441 01/18/14 1738 01/18/14 2010  HGB 12.7*  --   --   HCT 39.6  --   --   PLT 282  --   --   CREATININE 0.59  --   --   TROPONINI <0.30 0.40* 0.53*    Estimated Creatinine Clearance: 94.6 ml/min (by C-G formula based on Cr of 0.59).   Medical History: Past Medical History  Diagnosis Date  . Hypertension   . Neuropathy   . Prostate enlargement   . DDD (degenerative disc disease), cervical   . CIDP (chronic inflammatory demyelinating polyneuropathy)   . Gait disorder     Medications:  Scheduled:  . [START ON 01/19/2014] doxazosin  2 mg Oral Daily  . feeding supplement (ENSURE COMPLETE)  237 mL Oral BID  . feeding supplement (PRO-STAT SUGAR FREE 64)  30 mL Oral BID  . [START ON 01/19/2014] finasteride  5 mg Oral Daily  . heparin  4,000 Units Intravenous Once  . [START ON 01/19/2014] pantoprazole  40 mg Oral Daily  . [START ON 01/19/2014] potassium chloride SA  40 mEq Oral Daily  . [START ON 01/19/2014] predniSONE  10 mg Oral Q breakfast  . [START ON 01/19/2014] sertraline  25 mg Oral Daily  . sodium chloride  3 mL Intravenous Q12H  . [START ON 01/19/2014] torsemide  20 mg Oral Daily    Assessment: Chest pain, SOB Troponin + Labs reviewed PTA medications reviewed Goal of Therapy:  Heparin level 0.3-0.7 units/ml Monitor platelets by anticoagulation protocol: Yes   Plan:  Give 4000 units bolus x 1 Start heparin infusion at 1400 units/hr Check anti-Xa level in 8 hours and daily while on heparin Continue to monitor H&H and platelets  Dylan Nelson, Dylan Nelson 01/18/2014,9:05  PM

## 2014-01-18 NOTE — Progress Notes (Signed)
Spoke with Dr.Merrell concerning pt's troponin's trending up from 0.4 to 0.53. Pt is asymptomatic at this time. MD states that for now we will get a repeat EKG, continue heparin drip and wait to see results of another troponin draw after 2300. Will continue to monitor.

## 2014-01-18 NOTE — ED Notes (Signed)
Patient being transferred to 300. Report given to Tedd Sias RN

## 2014-01-18 NOTE — H&P (Signed)
[facility] Admission History and Physical   Patient name: Dylan Nelson Medical record number: [id number] Date of birth: Oct 27, 1933 Age: 80 y.o. Gender: male  Primary Care Provider: Alonza Bogus, MD Consultants: none Code Status: Full  Chief Complaint: Chest pain  Assessment and Plan: Dylan Nelson is a 80 y.o. male presenting with chest pain. PMH is significant for HTN, CIDP, BPH, BPH.  Chest pain: HEART score 5. Pt w/ several risk factors for ACS. CP, cardiac vs MSK vs reflux. Cardiac most likely. MSK unlikely though did just finish PT session - non-reducible on exam. No h/o or other symptoms of reflux. Radiation to back concerning for dissection vs PNA but unlikely given resolution. EKG on admission w/ sinus tach.  - Admit to tele for obs - cycle troponins - EKG in am - f/u outpt w/ Cardiologist if workup is negative for eval and possible chemical stress test.   HTN: below goal but asymptomatic at time of presentation.  - Cont home Torsemide.   BPH: urinating w/o difficulty - continue home finasteride adn doxazosin   Chronic idiopathic demyelnating polyneuropathy: at baseline - Continue home prednisone 10 - PT/OT  Leukocytosis: improved from previous admission. Stress reaction vs idopathic vs infection vs hemoconcentration.  - CBC in am.   Depression: - continue home Zoloft - cont home Ativan  FEN/GI: reg diet.  - continuie home PPI - continue hoem pro-stat  Prophylaxis: Hep [place] TID  Disposition: Obs status for CP r/o   History of Present Illness: Dylan Nelson is a 80 y.o. male presenting with CP. Onset at 13:00. Achy in nature. Center of chest w/ radiation to shoulder blades. Denies diaphoresis, syncope. Associated w/ SOB. Pt was just finishing physical therapy for the day. Resolved after 1 hour of rest. EMS called an arrived just as pain was resolving. Unsure if medications given at time of EMS arrival.   Review Of Systems: Per HPI with the  following additions: none Otherwise 12 point review of systems was performed and was unremarkable.  Patient Active Problem List   Diagnosis Date Noted  . Scrotal abscess 11/07/2013  . CIDP (chronic inflammatory demyelinating polyneuropathy) 11/05/2013  . Bilateral leg edema 11/02/2013  . HTN (hypertension) 11/02/2013  . Neuropathy 11/02/2013  . Muscle weakness (generalized) 09/07/2013  . Unable to walk 09/07/2013   Past Medical History: Past Medical History  Diagnosis Date  . Hypertension   . Neuropathy   . Prostate enlargement   . DDD (degenerative disc disease), cervical   . CIDP (chronic inflammatory demyelinating polyneuropathy)   . Gait disorder    Past Surgical History: Past Surgical History  Procedure Laterality Date  . Hernia repair     Social History: History  Substance Use Topics  . Smoking status: Former Research scientist (life sciences)  . Smokeless tobacco: Not on file  . Alcohol Use: No   Additional social history: none  Please also refer to relevant sections of EMR.  Family History: No family history on file. Allergies and Medications: No Known Allergies No current facility-administered medications on file prior to encounter.   Current Outpatient Prescriptions on File Prior to Encounter  Medication Sig Dispense Refill  . doxazosin (CARDURA) 2 MG tablet Take 1 tablet by mouth daily.      Marland Kitchen etodolac (LODINE) 400 MG tablet Take 1 tablet by mouth 2 (two) times daily.      . finasteride (PROSCAR) 5 MG tablet Take 1 tablet by mouth daily.      . heparin 5000  UNIT/ML injection Inject 1 mL (5,000 Units total) into the skin every 8 (eight) hours.  1 mL    . HYDROcodone-acetaminophen (NORCO/VICODIN) 5-325 MG per tablet Take 1 tablet by mouth every 4 (four) hours as needed for moderate pain.  30 tablet  0  . omeprazole (PRILOSEC) 20 MG capsule Take 20 mg by mouth daily.      . predniSONE (DELTASONE) 10 MG tablet Take 1 tablet (10 mg total) by mouth daily with breakfast.      . torsemide  (DEMADEX) 20 MG tablet Take 1 tablet by mouth daily.        Objective: BP 112/76  Pulse 118  Temp(Src) 98.5 F (36.9 C)  Resp 20  Ht 6\' 1"  (1.854 m)  Wt 99.791 kg (220 lb)  BMI 29.03 kg/m2  SpO2 95% Exam: General: NAD, WNWD, Elderly HEENT: mmm, EOMI Cardiovascular: Distant heart sounds, RRR Respiratory: CTAB, nml effort Abdomen: NABS, soft and nonttp Extremities: trace LE pitting edema Skin: intact Neuro: CN2-12 grossly intact. Limited movement of LE.   Labs and Imaging: Results for orders placed during the hospital encounter of 01/18/14 (from the past 24 hour(s))  CBC WITH DIFFERENTIAL     Status: Abnormal   Collection Time    01/18/14  2:41 PM      Result Value Ref Range   WBC 13.8 (*) 4.0 - 10.5 K/uL   RBC 4.36  4.22 - 5.81 MIL/uL   Hemoglobin 12.7 (*) 13.0 - 17.0 g/dL   HCT 39.6  39.0 - 52.0 %   MCV 90.8  78.0 - 100.0 fL   MCH 29.1  26.0 - 34.0 pg   MCHC 32.1  30.0 - 36.0 g/dL   RDW 14.6  11.5 - 15.5 %   Platelets 282  150 - 400 K/uL   Neutrophils Relative % 84 (*) 43 - 77 %   Neutro Abs 11.7 (*) 1.7 - 7.7 K/uL   Lymphocytes Relative 11 (*) 12 - 46 %   Lymphs Abs 1.5  0.7 - 4.0 K/uL   Monocytes Relative 5  3 - 12 %   Monocytes Absolute 0.6  0.1 - 1.0 K/uL   Eosinophils Relative 0  0 - 5 %   Eosinophils Absolute 0.0  0.0 - 0.7 K/uL   Basophils Relative 0  0 - 1 %   Basophils Absolute 0.0  0.0 - 0.1 K/uL  BASIC METABOLIC PANEL     Status: Abnormal   Collection Time    01/18/14  2:41 PM      Result Value Ref Range   Sodium 137  137 - 147 mEq/L   Potassium 4.6  3.7 - 5.3 mEq/L   Chloride 97  96 - 112 mEq/L   CO2 28  19 - 32 mEq/L   Glucose, Bld 247 (*) 70 - 99 mg/dL   BUN 25 (*) 6 - 23 mg/dL   Creatinine, Ser 0.59  0.50 - 1.35 mg/dL   Calcium 9.6  8.4 - 10.5 mg/dL   GFR calc non Af Amer >90  >90 mL/min   GFR calc Af Amer >90  >90 mL/min   Anion gap 12  5 - 15  TROPONIN I     Status: None   Collection Time    01/18/14  2:41 PM      Result Value Ref  Range   Troponin I <0.30  <0.30 ng/mL    Dg Chest Portable 1 View  01/18/2014   CLINICAL DATA:  Chest pain.  EXAM: PORTABLE CHEST - 1 VIEW  COMPARISON:  Chest radiograph Nov 12, 2013  FINDINGS: The cardiac silhouette appears at least mildly enlarged, even with consideration to this low inspiratory examination. Similar moderate chronic interstitial changes without pleural effusions or focal consolidation. No pneumothorax. Interval removal of right PICC. Soft tissue planes and included osseous structures are unchanged, high-riding right humeral head with dystrophic calcifications at right [facility] joint.  IMPRESSION: Stable cardiomegaly and chronic interstitial changes.   Electronically Signed   By: Elon Alas   On: 01/18/2014 14:48     Waldemar Dickens, MD Family Medicine 01/18/2014, 4:00 PM [facility]

## 2014-01-18 NOTE — Progress Notes (Signed)
Troponin + at 0.4 Pt w/o CP/SOB. EKG ordered.  Discussed w/ on call cards team and appreciate their input.  Start Heparin Continue to trend Troponins F/u EKG Start IV metop if becomes tachy again.    Linna Darner, MD [facility] 01/18/2014, 7:35 PM

## 2014-01-18 NOTE — ED Provider Notes (Signed)
CSN: [id number]     Arrival date & time 01/18/14  1429 History   First MD Initiated Contact with Patient 01/18/14 1440     Chief Complaint  Patient presents with  . Chest Pain      HPI Pt was seen at 1445. Per EMS and pt report, c/o gradual onset and persistence of constant mid-sternal chest "pain" that began after his PT session today approximately 1300. Pt states he was "exercising with the weights on my legs," and when he stopped he developed mid-sternal chest "pressure." States the pressure started to radiated to his back/in between his shoulder blades and he became SOB. Pt states his symptoms resolved "after EMS came to get me" approximately 1500.  Denies any symptoms currently. Denies palpitations, no cough, no abd pain, no N/V/D.    Past Medical History  Diagnosis Date  . Hypertension   . Neuropathy   . Prostate enlargement   . DDD (degenerative disc disease), cervical   . CIDP (chronic inflammatory demyelinating polyneuropathy)   . Gait disorder    Past Surgical History  Procedure Laterality Date  . Hernia repair      History  Substance Use Topics  . Smoking status: Former Research scientist (life sciences)  . Smokeless tobacco: Not on file  . Alcohol Use: No    Review of Systems ROS: Statement: All systems negative except as marked or noted in the HPI; Constitutional: Negative for fever and chills. ; ; Eyes: Negative for eye pain, redness and discharge. ; ; ENMT: Negative for ear pain, hoarseness, nasal congestion, sinus pressure and sore throat. ; ; Cardiovascular: +CP, SOB. Negative for palpitations, diaphoresis, and peripheral edema. ; ; Respiratory: Negative for cough, wheezing and stridor. ; ; Gastrointestinal: Negative for nausea, vomiting, diarrhea, abdominal pain, blood in stool, hematemesis, jaundice and rectal bleeding. . ; ; Genitourinary: Negative for dysuria, flank pain and hematuria. ; ; Musculoskeletal: Negative for back pain and neck pain. Negative for swelling and trauma.; ; Skin:  Negative for pruritus, rash, abrasions, blisters, bruising and skin lesion.; ; Neuro: Negative for headache, lightheadedness and neck stiffness. Negative for weakness, altered level of consciousness , altered mental status, extremity weakness, paresthesias, involuntary movement, seizure and syncope.        Allergies  Review of patient's allergies indicates no known allergies.  Home Medications   Prior to Admission medications   Medication Sig Start Date End Date Taking? Authorizing Provider  Amino Acids-Protein Hydrolys (FEEDING SUPPLEMENT, PRO-STAT SUGAR FREE 64,) LIQD Take 30 mLs by mouth 2 (two) times daily.   Yes Historical Provider, MD  bisacodyl (DULCOLAX) 10 MG suppository Place 10 mg rectally as needed for moderate constipation.   Yes Historical Provider, MD  doxazosin (CARDURA) 2 MG tablet Take 1 tablet by mouth daily. 09/04/13  Yes Historical Provider, MD  ENSURE (ENSURE) Take 237 mLs by mouth 2 (two) times daily.   Yes Historical Provider, MD  etodolac (LODINE) 400 MG tablet Take 1 tablet by mouth 2 (two) times daily. 08/27/13  Yes Historical Provider, MD  finasteride (PROSCAR) 5 MG tablet Take 1 tablet by mouth daily. 08/27/13  Yes Historical Provider, MD  heparin 5000 UNIT/ML injection Inject 1 mL (5,000 Units total) into the skin every 8 (eight) hours. 11/07/13  Yes Alonza Bogus, MD  HYDROcodone-acetaminophen (NORCO/VICODIN) 5-325 MG per tablet Take 1 tablet by mouth every 4 (four) hours as needed for moderate pain. 11/07/13  Yes Alonza Bogus, MD  LORazepam (ATIVAN) 0.5 MG tablet Take 0.5 mg by mouth  every 6 (six) hours as needed for anxiety.   Yes Historical Provider, MD  omeprazole (PRILOSEC) 20 MG capsule Take 20 mg by mouth daily.   Yes Historical Provider, MD  polyethylene glycol (MIRALAX / GLYCOLAX) packet Take 17 g by mouth daily.   Yes Historical Provider, MD  potassium chloride SA (K-DUR,KLOR-CON) 20 MEQ tablet Take 40 mEq by mouth.   Yes Historical Provider, MD   predniSONE (DELTASONE) 10 MG tablet Take 1 tablet (10 mg total) by mouth daily with breakfast. 11/07/13  Yes Alonza Bogus, MD  senna-docusate (SENOKOT-S) 8.6-50 MG per tablet Take 1 tablet by mouth 2 (two) times daily.   Yes Historical Provider, MD  sertraline (ZOLOFT) 25 MG tablet Take 25 mg by mouth daily.   Yes Historical Provider, MD  torsemide (DEMADEX) 20 MG tablet Take 1 tablet by mouth daily. 08/27/13  Yes Historical Provider, MD   BP 112/76  Pulse 118  Temp(Src) 98.5 F (36.9 C)  Resp 20  Ht 6\' 1"  (1.854 m)  Wt 220 lb (99.791 kg)  BMI 29.03 kg/m2  SpO2 95% Physical Exam1450: Physical examination:  Nursing notes reviewed; Vital signs and O2 SAT reviewed;  Constitutional: Well developed, Well nourished, Well hydrated, In no acute distress; Head:  Normocephalic, atraumatic; Eyes: EOMI, PERRL, No scleral icterus; ENMT: Mouth and pharynx normal, Mucous membranes moist; Neck: Supple, Full range of motion, No lymphadenopathy; Cardiovascular: Tachycardic rate and rhythm, No gallop; Respiratory: Breath sounds clear & equal bilaterally, No rales, rhonchi, wheezes.  Speaking full sentences with ease, Normal respiratory effort/excursion; Chest: Nontender, Movement normal; Abdomen: Soft, Nontender, Nondistended, Normal bowel sounds; Genitourinary: No CVA tenderness; Extremities: Pulses normal, No tenderness, No edema, No calf edema or asymmetry.; Neuro: AA&Ox3, Major CN grossly intact.  Speech clear. No gross focal motor deficits in extremities.; Skin: Color normal, Warm, Dry.   ED Course  Procedures     EKG Interpretation None      MDM  MDM Reviewed: previous chart, nursing note and vitals Reviewed previous: ECG Interpretation: ECG, labs and x-ray    Date: 01/18/2014  Rate: 115  Rhythm: sinus tachycardia  QRS Axis: normal  Intervals: normal  ST/T Wave abnormalities: normal  Conduction Disutrbances:none  Narrative Interpretation:   Old EKG Reviewed: unchanged; no significant  changes from previous EKG dated 11/02/2013.  Results for orders placed during the hospital encounter of 01/18/14  CBC WITH DIFFERENTIAL      Result Value Ref Range   WBC 13.8 (*) 4.0 - 10.5 K/uL   RBC 4.36  4.22 - 5.81 MIL/uL   Hemoglobin 12.7 (*) 13.0 - 17.0 g/dL   HCT 39.6  39.0 - 52.0 %   MCV 90.8  78.0 - 100.0 fL   MCH 29.1  26.0 - 34.0 pg   MCHC 32.1  30.0 - 36.0 g/dL   RDW 14.6  11.5 - 15.5 %   Platelets 282  150 - 400 K/uL   Neutrophils Relative % 84 (*) 43 - 77 %   Neutro Abs 11.7 (*) 1.7 - 7.7 K/uL   Lymphocytes Relative 11 (*) 12 - 46 %   Lymphs Abs 1.5  0.7 - 4.0 K/uL   Monocytes Relative 5  3 - 12 %   Monocytes Absolute 0.6  0.1 - 1.0 K/uL   Eosinophils Relative 0  0 - 5 %   Eosinophils Absolute 0.0  0.0 - 0.7 K/uL   Basophils Relative 0  0 - 1 %   Basophils Absolute 0.0  0.0 -  0.1 K/uL  BASIC METABOLIC PANEL      Result Value Ref Range   Sodium 137  137 - 147 mEq/L   Potassium 4.6  3.7 - 5.3 mEq/L   Chloride 97  96 - 112 mEq/L   CO2 28  19 - 32 mEq/L   Glucose, Bld 247 (*) 70 - 99 mg/dL   BUN 25 (*) 6 - 23 mg/dL   Creatinine, Ser 0.59  0.50 - 1.35 mg/dL   Calcium 9.6  8.4 - 10.5 mg/dL   GFR calc non Af Amer >90  >90 mL/min   GFR calc Af Amer >90  >90 mL/min   Anion gap 12  5 - 15  TROPONIN I      Result Value Ref Range   Troponin I <0.30  <0.30 ng/mL   Dg Chest Portable 1 View 01/18/2014   CLINICAL DATA:  Chest pain.  EXAM: PORTABLE CHEST - 1 VIEW  COMPARISON:  Chest radiograph Nov 12, 2013  FINDINGS: The cardiac silhouette appears at least mildly enlarged, even with consideration to this low inspiratory examination. Similar moderate chronic interstitial changes without pleural effusions or focal consolidation. No pneumothorax. Interval removal of right PICC. Soft tissue planes and included osseous structures are unchanged, high-riding right humeral head with dystrophic calcifications at right [facility] joint.  IMPRESSION: Stable cardiomegaly and chronic interstitial  changes.   Electronically Signed   By: Elon Alas   On: 01/18/2014 14:48    1535:  Continues to deny symptoms since arrival to the ED. ASA given. Dx and testing d/w pt and family.  Questions answered.  Verb understanding, agreeable to admit.  T/C to [facility] Dr. Marily Memos, case discussed, including:  HPI, pertinent PM/SHx, VS/PE, dx testing, ED course and treatment:  Agreeable to admit, requests to write temporary orders, obtain observation tele bed to Dr. Luan Pulling' service.      Francine Graven, DO 01/19/14 [phone]

## 2014-01-18 NOTE — Progress Notes (Signed)
CRITICAL VALUE ALERT  Critical value received:  Troponin 0.40  Date of notification:  01/18/14  Time of notification:  1800  Critical value read back:yes  Nurse who received alert:  Lenon Curt RN  MD notified (1st page):  Dr Marily Memos  Time of first page:  1850  MD notified (2nd page):  Time of second page:  Responding MD:  Dr Marily Memos  Time MD responded:  [phone]

## 2014-01-18 NOTE — Progress Notes (Signed)
Dr.Lama made aware that Pt's troponin's still trending up to 0.60. MD states he will call Cardiologist and get back to me. The pt is asymptomatic and repeat EKG is NSR. Will continue to monitor.

## 2014-01-18 NOTE — ED Notes (Signed)
Pt c/o generalized cp radiating into back with slight sob after doing PT at [facility]hab today.

## 2014-01-19 ENCOUNTER — Encounter (HOSPITAL_COMMUNITY): Payer: Self-pay | Admitting: Adult Health

## 2014-01-19 DIAGNOSIS — N400 Enlarged prostate without lower urinary tract symptoms: Secondary | ICD-10-CM | POA: Diagnosis present

## 2014-01-19 DIAGNOSIS — I10 Essential (primary) hypertension: Secondary | ICD-10-CM | POA: Diagnosis present

## 2014-01-19 DIAGNOSIS — I77810 Thoracic aortic ectasia: Secondary | ICD-10-CM

## 2014-01-19 DIAGNOSIS — K5900 Constipation, unspecified: Secondary | ICD-10-CM | POA: Diagnosis present

## 2014-01-19 DIAGNOSIS — R778 Other specified abnormalities of plasma proteins: Secondary | ICD-10-CM | POA: Diagnosis present

## 2014-01-19 DIAGNOSIS — R0789 Other chest pain: Secondary | ICD-10-CM | POA: Diagnosis present

## 2014-01-19 DIAGNOSIS — G6181 Chronic inflammatory demyelinating polyneuritis: Secondary | ICD-10-CM

## 2014-01-19 DIAGNOSIS — I214 Non-ST elevation (NSTEMI) myocardial infarction: Secondary | ICD-10-CM | POA: Diagnosis present

## 2014-01-19 DIAGNOSIS — R7989 Other specified abnormal findings of blood chemistry: Secondary | ICD-10-CM

## 2014-01-19 DIAGNOSIS — I119 Hypertensive heart disease without heart failure: Secondary | ICD-10-CM

## 2014-01-19 DIAGNOSIS — I498 Other specified cardiac arrhythmias: Secondary | ICD-10-CM | POA: Diagnosis present

## 2014-01-19 DIAGNOSIS — G709 Myoneural disorder, unspecified: Secondary | ICD-10-CM | POA: Diagnosis present

## 2014-01-19 DIAGNOSIS — Z8249 Family history of ischemic heart disease and other diseases of the circulatory system: Secondary | ICD-10-CM | POA: Diagnosis not present

## 2014-01-19 DIAGNOSIS — Z87891 Personal history of nicotine dependence: Secondary | ICD-10-CM | POA: Diagnosis not present

## 2014-01-19 DIAGNOSIS — R079 Chest pain, unspecified: Secondary | ICD-10-CM

## 2014-01-19 DIAGNOSIS — N390 Urinary tract infection, site not specified: Secondary | ICD-10-CM | POA: Diagnosis present

## 2014-01-19 LAB — CBC
HEMATOCRIT: 39.9 % (ref 39.0–52.0)
HEMOGLOBIN: 12.7 g/dL — AB (ref 13.0–17.0)
MCH: 29.4 pg (ref 26.0–34.0)
MCHC: 31.8 g/dL (ref 30.0–36.0)
MCV: 92.4 fL (ref 78.0–100.0)
Platelets: 281 10*3/uL (ref 150–400)
RBC: 4.32 MIL/uL (ref 4.22–5.81)
RDW: 14.8 % (ref 11.5–15.5)
WBC: 14.5 10*3/uL — AB (ref 4.0–10.5)

## 2014-01-19 LAB — TROPONIN I
Troponin I: <0.3 ng/mL (ref <0.30)
Troponin I: <0.3 ng/mL (ref <0.30)

## 2014-01-19 LAB — HEPARIN LEVEL (UNFRACTIONATED): Heparin Unfractionated: 0.68 IU/mL (ref 0.30–0.70)

## 2014-01-19 MED ORDER — METOPROLOL TARTRATE 25 MG PO TABS
12.5000 mg | ORAL_TABLET | Freq: Two times a day (BID) | ORAL | Status: DC
  Administered 2014-01-19 – 2014-01-21 (×6): 12.5 mg via ORAL
  Filled 2014-01-19 (×6): qty 1

## 2014-01-19 MED ORDER — ASPIRIN 325 MG PO TABS
325.0000 mg | ORAL_TABLET | Freq: Every day | ORAL | Status: DC
  Administered 2014-01-19 – 2014-01-21 (×3): 325 mg via ORAL
  Filled 2014-01-19 (×3): qty 1

## 2014-01-19 MED ORDER — ATORVASTATIN CALCIUM 40 MG PO TABS
40.0000 mg | ORAL_TABLET | Freq: Every day | ORAL | Status: DC
  Administered 2014-01-19 – 2014-01-20 (×2): 40 mg via ORAL
  Filled 2014-01-19 (×3): qty 1

## 2014-01-19 MED ORDER — REGADENOSON 0.4 MG/5ML IV SOLN
0.4000 mg | Freq: Once | INTRAVENOUS | Status: DC
  Filled 2014-01-19: qty 5

## 2014-01-19 MED ORDER — LISINOPRIL 5 MG PO TABS
2.5000 mg | ORAL_TABLET | Freq: Every day | ORAL | Status: DC
  Administered 2014-01-19 – 2014-01-21 (×3): 2.5 mg via ORAL
  Filled 2014-01-19 (×3): qty 1

## 2014-01-19 NOTE — Evaluation (Signed)
Physical Therapy Evaluation Patient Details Name: Dylan Nelson MRN: [id number] DOB: 12-Jan-1934 Today's Date: 01/19/2014   History of Present Illness  Dylan Nelson is a 80 y.o. male presenting with chest pain. PMH is significant for HTN, CIDP, BPH, BPH.  Pt reported increased chest pain/shoulder pain afer PT session at [facility].  Clinical Impression  Pt is a 80 year old male who presents to physical therapy with dx of chest pain.  Pt has a hx of CIDP, with resultant weakness in LE, and ankle strength 0/5.  Pt reports he has been non-ambulatory for 14 years, though was able to transfer into vehicle by pulling his body up with his arms and WB through his LE.  Pt has been unable to stand recently and was receiving PT services at [facility] to increase LE strength for improvement of transfers.  Pt has been able to transfer from bed <-> W/C with slideboard transfer and assist x1 for supervision, and has just started working on sit <-> stand transfers at grab bar with assist x2.  During evaluation, pt required min assist for supine -> sit and mod assist for sit -> supine with HOB elevated.  Slideboard transfer not attempted as W/C and slideboard not present during PT evaluation.  Recommend continued PT to address strengthening and activity tolerance for improved bed mobility skills with transfer back to SNF for continued rehab.     Follow Up Recommendations SNF    Equipment Recommendations  None recommended by PT       Precautions / Restrictions Precautions Precautions: Fall Precaution Comments: Slide board transfers, drop foot (B) Restrictions Weight Bearing Restrictions: No      Mobility  Bed Mobility Overal bed mobility: Needs Assistance Bed Mobility: Rolling Rolling: Modified independent (Device/Increase time) (to the Lt, min/mod assist to Rt (as pt unable to use Lt UE to assist with rolling))   Supine to sit: HOB elevated;Min assist (for trunk, increased time to complete task) Sit to supine:  HOB elevated;Mod assist (for LE, increased time to complete task)      Transfers                 General transfer comment: Not attempted as pt personal slide board/W/C not present.  Pt has been working with [facility] PT on sit <->stand transfers with grab bar and assist x2.   Ambulation/Gait             General Gait Details: Pt is non-ambulatory.      Balance Overall balance assessment: History of Falls;Needs assistance Sitting-balance support: Single extremity supported;Feet supported Sitting balance-Leahy Scale: Good         Standing balance comment: Not attempted.                              Pertinent Vitals/Pain No complaints of pain.     Home Living Family/patient expects to be discharged to:: Skilled nursing facility                      Prior Function Level of Independence: Needs assistance   Gait / Transfers Assistance Needed: Pt is non-ambulatory.  Pt able self propel W/C for short distance, though is mainly pushed in W/C.  Pt is able to navigate with personal electric scooter.  Pt uses a slideboard for transfers.      Comments: Pt has been residing at [facility] center, receiving PT services, and was recently discharged  from OT services.     Hand Dominance   Dominant Hand: Right    Extremity/Trunk Assessment      Lower Extremity Assessment: RLE deficits/detail;LLE deficits/detail RLE Deficits / Details: MMT hip 3+/5, knee 4-/5, ankle 0/5 LLE Deficits / Details: MMT hip 4-/5, knee 4/5, ankle 0/5     Communication   Communication: No difficulties  Cognition Arousal/Alertness: Awake/alert Behavior During Therapy: WFL for tasks assessed/performed Overall Cognitive Status: Within Functional Limits for tasks assessed                               Assessment/Plan    PT Assessment Patient needs continued PT services  PT Diagnosis Generalized weakness   PT Problem List Decreased strength;Decreased  mobility  PT Treatment Interventions Balance training;Neuromuscular re-education;Functional mobility training;Therapeutic activities;Therapeutic exercise;Patient/family education   PT Goals (Current goals can be found in the Care Plan section) Acute Rehab PT Goals Patient Stated Goal: get stronger to get into car PT Goal Formulation: With patient Time For Goal Achievement: 02/02/14 Potential to Achieve Goals: Fair    Frequency Min 3X/week   Barriers to discharge Inaccessible home environment         End of Session   Activity Tolerance: Patient tolerated treatment well Patient left: in bed;with call bell/phone within reach;with bed alarm set      Functional Assessment Tool Used: Clinical Judgement Functional Limitation: Changing and maintaining body position Changing and Maintaining Body Position Current Status (Z1245): At least 40 percent but less than 60 percent impaired, limited or restricted Changing and Maintaining Body Position Goal Status (Y0998): At least 20 percent but less than 40 percent impaired, limited or restricted    Time: 1030-1050 PT Time Calculation (min): 20 min   Charges:   PT Evaluation $Initial PT Evaluation Tier I: 1 Procedure     PT G Codes:   Functional Assessment Tool Used: Clinical Judgement Functional Limitation: Changing and maintaining body position    Dylan Nelson 01/19/2014, 11:02 AM

## 2014-01-19 NOTE — Evaluation (Addendum)
Occupational Therapy Evaluation Patient Details Name: Dylan Nelson MRN: [id number] DOB: [birth year] Today's Date: 01/19/2014    History of Present Illness VIRAL SCHRAMM is a 80 y.o. male presenting with chest pain. PMH is significant for HTN, CIDP, BPH, BPH.  Pt reported increased chest pain/shoulder pain afer PT session at [facility].   Clinical Impression   Pt is presenting to acute OT with above situation.  He was receiving PT services at [facility] prior to admission, and verbalizes wanting to return.  Pt reports being at baseline from prior to admission with ADL needs - pt was recently discharged from OT services at [facility] and will not requires further OT services.  Pt will benefit from PT evaluation to determine further need for PT services upon return to SNF. Pt to be discharged from acute OT services.    Follow Up Recommendations  No OT follow up;SNF    Equipment Recommendations   (Defer to SNF)    Recommendations for Other Services       Precautions / Restrictions Precautions Precautions: Fall Restrictions Weight Bearing Restrictions: No      Mobility Bed Mobility                  Transfers                      Balance                                            ADL Overall ADL's : At baseline                                       General ADL Comments: Pt reports being at baseline with ADL functioning. Pt was also recently discharged from OT services at [facility].     Vision                     Perception     Praxis      Pertinent Vitals/Pain Pt denies pain     Hand Dominance Right   Extremity/Trunk Assessment Upper Extremity Assessment Upper Extremity Assessment: Overall WFL for tasks assessed;LUE deficits/detail;RUE deficits/detail RUE Deficits / Details: WFL strenght/AROM.  Pt reports fingertip numbness along tips of digits 3-5. 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 2-/5 strenght in shoulder flexion/abduction and elbow flexion.  has [facility] strenght/ROM for supination/pronation, elbow extension, and hand/wrist.  Pt report that having had condition so long, he is able to coplete all needed tasks using his LUE. LUE Coordination: decreased gross motor   Lower Extremity Assessment Lower Extremity Assessment: Defer to PT evaluation       Communication Communication Communication: No difficulties   Cognition Arousal/Alertness: Awake/alert Behavior During Therapy: WFL for tasks assessed/performed Overall Cognitive Status: Within Functional Limits for tasks assessed                     General Comments       Exercises       Shoulder Instructions      Home Living Family/patient expects to be discharged to:: Skilled nursing facility  Prior Functioning/Environment Level of Independence: Needs assistance  Gait / Transfers Assistance Needed: Pt reports working on standing at [place], but using w/c for mobility most of the time.     Comments: Pt has been residing at [facility], receiving PT services, and was recently discharged from OT services.    OT Diagnosis:     OT Problem List:     OT Treatment/Interventions:      OT Goals(Current goals can be found in the care plan section) Acute Rehab OT Goals Patient Stated Goal: to stand up again - No OT goals needed OT Goal Formulation: With patient  OT Frequency:     Barriers to D/C:            Co-evaluation              End of Session    Activity Tolerance: Patient tolerated treatment well Patient left: in bed;with bed alarm set;with call bell/phone within reach   Time: 1660-6004 OT Time Calculation (min): 17 min Charges:  OT General Charges $OT Visit: 1 Procedure OT Evaluation $Initial OT Evaluation Tier I: 1 Procedure G-Codes: OT G-codes **NOT FOR INPATIENT  CLASS** Functional Assessment Tool Used: Clinical Judgement Functional Limitation: Carrying, moving and handling objects Carrying, Moving and Handling Objects Current Status (H9977): At least 1 percent but less than 20 percent impaired, limited or restricted Carrying, Moving and Handling Objects Goal Status [phone]): At least 1 percent but less than 20 percent impaired, limited or restricted Carrying, Moving and Handling Objects Discharge Status [phone]): At least 1 percent but less than 20 percent impaired, limited or restricted   Bea Graff, [place], OTR/L [phone]  01/19/2014, 9:22 AM

## 2014-01-19 NOTE — Progress Notes (Signed)
Pt in bed, eyes closed. Vital signs stable. No signs of distress at this time.

## 2014-01-19 NOTE — Consult Note (Signed)
CARDIOLOGY CONSULT NOTE   Patient ID: Dylan Nelson MRN: [id number] DOB/AGE: 08-20-1933 80 y.o.  Admit Date: 01/18/2014 Referring Physician:Hawkins, Percell Miller MD Primary Physician: Alonza Bogus, MD Consulting Cardiologist: Kate Sable MD Primary Cardiologist: New Reason for Consultation: Chest Pain   Clinical Summary [name] is a 80 y.o.male with known history of hypertension, chronic severe demyelenating polyneruopathy, cervical DDD,  and depression admitted with chest pain. He has no prior cardiac history. It is currently a resident of [facility] undergoing physical therapy. After physical therapy yesterday the patient was sitting in his wheelchair and began to have a hard ache substernal, radiating bilaterally and under the right arm, and then radiating to his back. This lasted approximately 30 minutes. He denied associated shortness of breath, diaphoresis, dizziness, or nausea. Was assisted back to bed, and pain was relieved. Secondary to his symptoms however he was brought to the emergency room for further evaluation. The patient states he never had pain in his chest in the past.  He was recently admitted in May 2015 in the setting of bilateral leg edema with hypertension and neuropathy. Echocardiogram was completed during that admission revealing normal ejection fraction of 55-60%. Was felt to be related to prednisone therapy which was initiated by neurology in the setting of a worsening polyneuropathy.  On arrival in the emergency room the patient's blood pressure was 112/76, heart rate 118, O2 sat 95%, respirations 20 with a temperature 98.5. Point-of-care troponin was positive at 0.40. The patient had a white blood cells at 13.8, glucose 247, with followup troponin I less than 0.30. However, subsequent troponin 0.53, 0.60 respectively. EKG revealed normal sinus rhythm, rate 80 beats per minute, with LVH.  The patient is currently without pain, stating pain had  resolved by the time he came to the emergency room. He was treated with aspirin and a IV fluid bolus of 250 mg x1.   No Known Allergies  Medications Scheduled Medications: . doxazosin  2 mg Oral Daily  . feeding supplement (ENSURE COMPLETE)  237 mL Oral BID  . feeding supplement (PRO-STAT SUGAR FREE 64)  30 mL Oral BID  . finasteride  5 mg Oral Daily  . metoprolol tartrate  12.5 mg Oral BID  . pantoprazole  40 mg Oral Daily  . potassium chloride SA  40 mEq Oral Daily  . predniSONE  10 mg Oral Q breakfast  . sertraline  25 mg Oral Daily  . sodium chloride  3 mL Intravenous Q12H  . torsemide  20 mg Oral Daily    Infusions: . sodium chloride 75 mL/hr at 01/19/14 0501  . heparin 1,400 Units/hr (01/18/14 2136)    PRN Medications: sodium chloride, acetaminophen, LORazepam, metoprolol, ondansetron (ZOFRAN) IV, sodium chloride   Past Medical History  Diagnosis Date  . Hypertension   . Neuropathy   . Prostate enlargement   . DDD (degenerative disc disease), cervical   . CIDP (chronic inflammatory demyelinating polyneuropathy)   . Gait disorder     Past Surgical History  Procedure Laterality Date  . Hernia repair      Family History  Problem Relation Age of Onset  . Heart attack Father 48  . Cancer Mother 36    Social History [name] reports that he has quit smoking. He does not have any smokeless tobacco history on file. [name] reports that he does not drink alcohol.  Review of Systems Otherwise reviewed and negative except as outlined.  Physical Examination Blood pressure 130/68, pulse  74, temperature 97.9 F (36.6 C), temperature source Oral, resp. rate 20, height 6\' 1"  (1.854 m), weight 226 lb 9.6 oz (102.785 kg), SpO2 98.00%.  Intake/Output Summary (Last 24 hours) at 01/19/14 0901 Last data filed at 01/19/14 0501  Gross per 24 hour  Intake 953.75 ml  Output   1150 ml  Net -196.25 ml    Telemetry: Normal sinus rhythm  GEN: Resting comfortably in  the bed without complaints of chest pain. HEENT: Conjunctiva and lids normal, oropharynx clear with moist mucosa. Neck: Supple, no elevated JVP or carotid bruits, no thyromegaly. Lungs: Clear to auscultation, nonlabored breathing at rest. Cardiac: Regular rate and rhythm, no S3 or significant systolic murmur, no pericardial rub. Abdomen: Soft, nontender, no hepatomegaly, bowel sounds present, no guarding or rebound. Extremities: No pitting edema, distal pulses 2+. Foot drop is noted bilaterally with generalized weakness, and diminished strength Skin: Warm and dry. Musculoskeletal: No kyphosis. Neuropsychiatric: Alert and oriented x3, affect grossly appropriate.  Prior Cardiac Testing/Procedures 1.Echocardiogram 10/2013 Procedure narrative: Transthoracic echocardiography. Image quality was suboptimal. The study was technically difficult, as a result of poor sound wave transmission. - Left ventricle: The cavity size was normal. Wall thickness was increased in a pattern of moderate LVH. Systolic function was normal. The estimated ejection fraction was in the range of 60% to 65%. There was an increased relative contribution of atrial contraction to ventricular filling. Doppler parameters are consistent with abnormal left ventricular relaxation (grade 1 diastolic dysfunction). - Aortic valve: Mildly calcified annulus. - Aorta: Mild aortic root dilatation. Aortic root dimension: 4.2 cm (ED). - Left atrium: The atrium was mildly dilated. - Pulmonic valve: There was mild regurgitation.   Lab Results  Basic Metabolic Panel:  Recent Labs Lab 01/18/14 1441  NA 137  K 4.6  CL 97  CO2 28  GLUCOSE 247*  BUN 25*  CREATININE 0.59  CALCIUM 9.6    CBC:  Recent Labs Lab 01/18/14 1441 01/19/14 0623  WBC 13.8* 14.5*  NEUTROABS 11.7*  --   HGB 12.7* 12.7*  HCT 39.6 39.9  MCV 90.8 92.4  PLT 282 281    Cardiac Enzymes:  Recent Labs Lab 01/18/14 1441 01/18/14 1738  01/18/14 2010 01/18/14 2306  TROPONINI <0.30 0.40* 0.53* 0.60*     Radiology: Dg Chest Portable 1 View  01/18/2014   CLINICAL DATA:  Chest pain.  EXAM: PORTABLE CHEST - 1 VIEW  COMPARISON:  Chest radiograph Nov 12, 2013  FINDINGS: The cardiac silhouette appears at least mildly enlarged, even with consideration to this low inspiratory examination. Similar moderate chronic interstitial changes without pleural effusions or focal consolidation. No pneumothorax. Interval removal of right PICC. Soft tissue planes and included osseous structures are unchanged, high-riding right humeral head with dystrophic calcifications at right [facility] joint.  IMPRESSION: Stable cardiomegaly and chronic interstitial changes.   Electronically Signed   By: Elon Alas   On: 01/18/2014 14:48     ECG: Normal sinus rhythm, rate of 84 beats per minute, LVH is noted.   Impression and Recommendations  1. Chest Pain:NSTEMI  Typical and atypical features, occurring after physical therapy while at rest. Described as an ache substernally radiating across his chest bilaterally, radiating into his back and under right arm lasting approximately 30 minutes, resolving on its own. Positive cardiac enzymes are noted with elevation of troponin of 0.40, 0.53, 0.6 respectively. EKG does not demonstrate any acute ST-T wave changes. Most recent echocardiogram demonstrated normal LV systolic function with diastolic dysfunction.  We will  plan Lexiscan Cardiolite stress test in a.m. as patient has just eaten breakfast diagnostic prognostic purposes. Continue heparin infusion, metoprolol 12.5 mg twice a day, and aspirin. Check fasting lipids in a.m. for risk stratification. Can consider repeating echocardiogram limited study, but one was just recently completed in May of this year. The patient has recurrent chest pain would recommend nitroglycerin patch, at which point cardiac catheterization should be considered. For now continue medical  therapy.  2. Hypertension: Currently well controlled on metoprolol, with diuretics. Creatinine stable.   3. Neuromuscular Disease: Has been undergoing physical therapy at [facility], and had been making progress. Continue physical therapy on discharge, with ongoing evaluation per neurology.  4. Elevated Blood Glucose: On admission. Likely related to steroid therapy. Consider checking hemoglobin A1c for stratification.  Signed: Phill Myron. Laiah Pouncey NP  01/19/2014, 9:01 AM Co-Sign MD

## 2014-01-19 NOTE — Progress Notes (Signed)
Called by nurse, for elevated troponin 0.60. Patient was earlier admitted with chest pain, started on heparin and aspirin. Patient currently is chest pain-free, EKG done only shows mild LVH Called and discussed with Dr. Delane Ginger, who recommends to continue with medical management, no need to transfer at this time. Also recommended to start scheduled metoprolol tartrate 12. 5 mg by mouth twice a day. Will start metoprolol as per cardiology recommendation, will get cardiology consultation in the a.m.

## 2014-01-19 NOTE — Consult Note (Signed)
The patient was seen and examined, and I agree with the assessment and plan as documented above, with modifications as noted below. 80 yr old male with one hour of chest pain which radiated across back and down right arm. Serum troponins mildly elevated on 8/3 as noted above. Echo in 10/2013 demonstrated normal systolic function, moderate LVH, and grade I diastolic dysfunction with mild aortic root enlargement (personally reviewed images). No chest pain since admission. Denies shortness of breath, lightheadedness, and palpitations. ECG demonstrates sinus rhythm with LVH. Currently being treated with metoprolol and heparin.  RECS: Given troponin elevation in the setting of 1 hour of chest pain, consistent with NSTEMI, I would recommend avoiding stress testing at this time. I will add ASA and statin therapy. I will d/c Cardura so that ACEI can be administered without provoking hypotension. Would continue IV heparin for another 24 hours. Will check additional serum troponins. If they continue to elevate, I would consider coronary angiography. Otherwise, I would defer stress testing to the outpatient setting after medical therapy is further optimized.

## 2014-01-19 NOTE — Progress Notes (Signed)
ANTICOAGULATION CONSULT NOTE - follow up  Pharmacy Consult for Heparin Indication: chest pain/ACS  No Known Allergies  Patient Measurements: Height: 6\' 1"  (185.4 cm) Weight: 226 lb 9.6 oz (102.785 kg) IBW/kg (Calculated) : 79.9 Heparin Dosing Weight: 99.8 kg  Vital Signs: Temp: 97.9 F (36.6 C) (08/04 0455) Temp src: Oral (08/04 0455) BP: 130/68 mmHg (08/04 0455) Pulse Rate: 74 (08/04 0455)  Labs:  Recent Labs  01/18/14 1441 01/18/14 1738 01/18/14 2010 01/18/14 2109 01/18/14 2306 01/19/14 0623  HGB 12.7*  --   --   --   --  12.7*  HCT 39.6  --   --   --   --  39.9  PLT 282  --   --   --   --  281  LABPROT  --   --   --  13.3  --   --   INR  --   --   --  1.01  --   --   HEPARINUNFRC  --   --   --   --   --  0.68  CREATININE 0.59  --   --   --   --   --   TROPONINI <0.30 0.40* 0.53*  --  0.60*  --    Estimated Creatinine Clearance: 95.9 ml/min (by C-G formula based on Cr of 0.59).  Medical History: Past Medical History  Diagnosis Date  . Hypertension   . Neuropathy   . Prostate enlargement   . DDD (degenerative disc disease), cervical   . CIDP (chronic inflammatory demyelinating polyneuropathy)   . Gait disorder    Medications:  Scheduled:  . doxazosin  2 mg Oral Daily  . feeding supplement (ENSURE COMPLETE)  237 mL Oral BID  . feeding supplement (PRO-STAT SUGAR FREE 64)  30 mL Oral BID  . finasteride  5 mg Oral Daily  . metoprolol tartrate  12.5 mg Oral BID  . pantoprazole  40 mg Oral Daily  . potassium chloride SA  40 mEq Oral Daily  . predniSONE  10 mg Oral Q breakfast  . sertraline  25 mg Oral Daily  . sodium chloride  3 mL Intravenous Q12H  . torsemide  20 mg Oral Daily   Assessment: 80yo male with chest pain, SOB Troponin + Labs reviewed PTA medications reviewed Heparin level is at goal.    Goal of Therapy:  Heparin level 0.3-0.7 units/ml Monitor platelets by anticoagulation protocol: Yes   Plan:  Continue Heparin at current rate (1400  units/hr) Heparin level and CBC daily while on Heparin  [place] Crane, Raheel Kunkle A 01/19/2014,8:02 AM

## 2014-01-19 NOTE — Clinical Social Work Psychosocial (Signed)
Clinical Social Work Department BRIEF PSYCHOSOCIAL ASSESSMENT 01/19/2014  Patient:  Dylan Nelson,Dylan Nelson     Account Number:  [id number]     Admit date:  01/18/2014  Clinical Social Worker:  ,, LCSW  Date/Time:  01/19/2014 11:08 AM  Referred by:  CSW  Date Referred:  01/19/2014 Referred for  SNF Placement   Other Referral:   Interview type:  Patient Other interview type:    PSYCHOSOCIAL DATA Living Status:  FACILITY Admitted from facility:  [facility] Level of care:  Skilled Nursing Facility Primary support name:  Alice Primary support relationship to patient:  SPOUSE Degree of support available:   supportive    CURRENT CONCERNS Current Concerns  Post-Acute Placement   Other Concerns:    SOCIAL WORK ASSESSMENT / PLAN CSW met with pt at bedside. Pt's wife sleeping in room. Pt alert and oriented and reports he has been a resident at [facility] for the past few months for rehab. He states that he is still receiving therapy and yesterday after completing a treatment session, began experiencing chest pain. [facility] sent pt to ED for evaluation. Pt reports his family is very involved and supportive. He said he has been in a wheelchair for about 14 years now. Pt admits he always things about going home, but he is not sure about how realistic that is. CSW confirmed above information with Sharon at [facility]. She is agreeable to return when stable.   Assessment/plan status:  Psychosocial Support/Ongoing Assessment of Needs Other assessment/ plan:   Information/referral to community resources:   [facility]    PATIENT'S/FAMILY'S RESPONSE TO PLAN OF CARE: Pt reports positive feelings regarding return to [facility] when medically stable. CSW will continue to follow.        , LCSW [phone] 

## 2014-01-19 NOTE — Care Management Note (Signed)
    Page 1 of 1   01/21/2014     9:20:19 AM CARE MANAGEMENT NOTE 01/21/2014  Patient:  Dylan Nelson, Dylan Nelson   Account Number:  [id number]  Date Initiated:  01/19/2014  Documentation initiated by:  Vladimir Creeks  Subjective/Objective Assessment:   admitted with chest pain and elevated troponins, NSTEMI. He is from [place]. He is there with Chronic idiopathic demyelnating polyneuropathy for PT, and plans to return. May continue there, as may not be able to return home     Action/Plan:   CSW aware of admission and will facilitate return to SNF at D/C   Anticipated DC Date:  01/21/2014   Anticipated DC Plan:  [place]  In-house referral  Clinical Social Worker      DC Planning Services  CM consult      Choice offered to / List presented to:             Status of service:  Completed, signed off Medicare Important Message given?  YES (If response is "NO", the following Medicare IM given date fields will be blank) Date Medicare IM given:  01/21/2014 Medicare IM given by:  Theophilus Kinds Date Additional Medicare IM given:   Additional Medicare IM given by:    Discharge Disposition:  [facility]  Per UR Regulation:  Reviewed for med. necessity/level of care/duration of stay  If discussed at Long Length of Stay Meetings, dates discussed:    Comments:  01/21/14 0920 Christinia Gully, RN BSN CM Pt discharged to [facility] today. CSW to arrange discharge to facility.  01/19/14 1300 Vladimir Creeks RN/CM

## 2014-01-19 NOTE — Progress Notes (Signed)
UR completed 

## 2014-01-19 NOTE — Progress Notes (Signed)
Subjective: He was admitted with chest discomfort. He said this was substernal but also between his shoulder blades. He has not had anymore chest pain. He is not known to have any history of cardiac disease.  Objective: Vital signs in last 24 hours: Temp:  [97.5 F (36.4 C)-98.5 F (36.9 C)] 97.9 F (36.6 C) (08/04 0455) Pulse Rate:  [74-118] 74 (08/04 0455) Resp:  [13-20] 20 (08/04 0455) BP: (106-132)/(68-79) 130/68 mmHg (08/04 0455) SpO2:  [93 %-98 %] 98 % (08/04 0455) Weight:  [99.79 kg (220 lb)-102.785 kg (226 lb 9.6 oz)] 102.785 kg (226 lb 9.6 oz) (08/04 0455) Weight change:  Last BM Date: 01/17/14  Intake/Output from previous day: 08/03 0701 - 08/04 0700 In: 953.8 [I.V.:953.8] Out: 1150 [Urine:1150]  PHYSICAL EXAM General appearance: alert, cooperative and no distress Resp: clear to auscultation bilaterally Cardio: regular rate and rhythm, S1, S2 normal, no murmur, click, rub or gallop GI: soft, non-tender; bowel sounds normal; no masses,  no organomegaly Extremities: He is extremely weak in his extremities  Lab Results:  Results for orders placed during the hospital encounter of 01/18/14 (from the past 48 hour(s))  CBC WITH DIFFERENTIAL     Status: Abnormal   Collection Time    01/18/14  2:41 PM      Result Value Ref Range   WBC 13.8 (*) 4.0 - 10.5 K/uL   RBC 4.36  4.22 - 5.81 MIL/uL   Hemoglobin 12.7 (*) 13.0 - 17.0 g/dL   HCT 39.6  39.0 - 52.0 %   MCV 90.8  78.0 - 100.0 fL   MCH 29.1  26.0 - 34.0 pg   MCHC 32.1  30.0 - 36.0 g/dL   RDW 14.6  11.5 - 15.5 %   Platelets 282  150 - 400 K/uL   Neutrophils Relative % 84 (*) 43 - 77 %   Neutro Abs 11.7 (*) 1.7 - 7.7 K/uL   Lymphocytes Relative 11 (*) 12 - 46 %   Lymphs Abs 1.5  0.7 - 4.0 K/uL   Monocytes Relative 5  3 - 12 %   Monocytes Absolute 0.6  0.1 - 1.0 K/uL   Eosinophils Relative 0  0 - 5 %   Eosinophils Absolute 0.0  0.0 - 0.7 K/uL   Basophils Relative 0  0 - 1 %   Basophils Absolute 0.0  0.0 - 0.1 K/uL   BASIC METABOLIC PANEL     Status: Abnormal   Collection Time    01/18/14  2:41 PM      Result Value Ref Range   Sodium 137  137 - 147 mEq/L   Potassium 4.6  3.7 - 5.3 mEq/L   Chloride 97  96 - 112 mEq/L   CO2 28  19 - 32 mEq/L   Glucose, Bld 247 (*) 70 - 99 mg/dL   BUN 25 (*) 6 - 23 mg/dL   Creatinine, Ser 0.59  0.50 - 1.35 mg/dL   Calcium 9.6  8.4 - 10.5 mg/dL   GFR calc non Af Amer >90  >90 mL/min   GFR calc Af Amer >90  >90 mL/min   Comment: (NOTE)     The eGFR has been calculated using the CKD EPI equation.     This calculation has not been validated in all clinical situations.     eGFR's persistently <90 mL/min signify possible Chronic Kidney     Disease.   Anion gap 12  5 - 15  TROPONIN I     Status:  None   Collection Time    01/18/14  2:41 PM      Result Value Ref Range   Troponin I <0.30  <0.30 ng/mL   Comment:            Due to the release kinetics of cTnI,     a negative result within the first hours     of the onset of symptoms does not rule out     myocardial infarction with certainty.     If myocardial infarction is still suspected,     repeat the test at appropriate intervals.  TROPONIN I     Status: Abnormal   Collection Time    01/18/14  5:38 PM      Result Value Ref Range   Troponin I 0.40 (*) <0.30 ng/mL   Comment:            Due to the release kinetics of cTnI,     a negative result within the first hours     of the onset of symptoms does not rule out     myocardial infarction with certainty.     If myocardial infarction is still suspected,     repeat the test at appropriate intervals.     CRITICAL RESULT CALLED TO, READ BACK BY AND VERIFIED WITH:     MORRIS,C ON 01/18/14 AT 1817 BY LOY,C  MRSA PCR SCREENING     Status: None   Collection Time    01/18/14  6:54 PM      Result Value Ref Range   MRSA by PCR NEGATIVE  NEGATIVE   Comment:            The GeneXpert MRSA Assay (FDA     approved for NASAL specimens     only), is one component of a      comprehensive MRSA colonization     surveillance program. It is not     intended to diagnose MRSA     infection nor to guide or     monitor treatment for     MRSA infections.  TROPONIN I     Status: Abnormal   Collection Time    01/18/14  8:10 PM      Result Value Ref Range   Troponin I 0.53 (*) <0.30 ng/mL   Comment:            Due to the release kinetics of cTnI,     a negative result within the first hours     of the onset of symptoms does not rule out     myocardial infarction with certainty.     If myocardial infarction is still suspected,     repeat the test at appropriate intervals.     CRITICAL VALUE NOTED.  VALUE IS CONSISTENT WITH PREVIOUSLY REPORTED AND CALLED VALUE.  PROTIME-INR     Status: None   Collection Time    01/18/14  9:09 PM      Result Value Ref Range   Prothrombin Time 13.3  11.6 - 15.2 seconds   INR 1.01  0.00 - 1.49  TROPONIN I     Status: Abnormal   Collection Time    01/18/14 11:06 PM      Result Value Ref Range   Troponin I 0.60 (*) <0.30 ng/mL   Comment:            Due to the release kinetics of cTnI,     a negative result within the first hours  of the onset of symptoms does not rule out     myocardial infarction with certainty.     If myocardial infarction is still suspected,     repeat the test at appropriate intervals.     CRITICAL VALUE NOTED.  VALUE IS CONSISTENT WITH PREVIOUSLY REPORTED AND CALLED VALUE.  CBC     Status: Abnormal   Collection Time    01/19/14  6:23 AM      Result Value Ref Range   WBC 14.5 (*) 4.0 - 10.5 K/uL   RBC 4.32  4.22 - 5.81 MIL/uL   Hemoglobin 12.7 (*) 13.0 - 17.0 g/dL   HCT 39.9  39.0 - 52.0 %   MCV 92.4  78.0 - 100.0 fL   MCH 29.4  26.0 - 34.0 pg   MCHC 31.8  30.0 - 36.0 g/dL   RDW 14.8  11.5 - 15.5 %   Platelets 281  150 - 400 K/uL  HEPARIN LEVEL (UNFRACTIONATED)     Status: None   Collection Time    01/19/14  6:23 AM      Result Value Ref Range   Heparin Unfractionated 0.68  0.30 - 0.70 IU/mL    Comment:            IF HEPARIN RESULTS ARE BELOW     EXPECTED VALUES, AND PATIENT     DOSAGE HAS BEEN CONFIRMED,     SUGGEST FOLLOW UP TESTING     OF ANTITHROMBIN III LEVELS.    ABGS No results found for this basename: PHART, PCO2, PO2ART, TCO2, HCO3,  in the last 72 hours CULTURES Recent Results (from the past 240 hour(s))  MRSA PCR SCREENING     Status: None   Collection Time    01/18/14  6:54 PM      Result Value Ref Range Status   MRSA by PCR NEGATIVE  NEGATIVE Final   Comment:            The GeneXpert MRSA Assay (FDA     approved for NASAL specimens     only), is one component of a     comprehensive MRSA colonization     surveillance program. It is not     intended to diagnose MRSA     infection nor to guide or     monitor treatment for     MRSA infections.   Studies/Results: Dg Chest Portable 1 View  01/18/2014   CLINICAL DATA:  Chest pain.  EXAM: PORTABLE CHEST - 1 VIEW  COMPARISON:  Chest radiograph Nov 12, 2013  FINDINGS: The cardiac silhouette appears at least mildly enlarged, even with consideration to this low inspiratory examination. Similar moderate chronic interstitial changes without pleural effusions or focal consolidation. No pneumothorax. Interval removal of right PICC. Soft tissue planes and included osseous structures are unchanged, high-riding right humeral head with dystrophic calcifications at right [facility] joint.  IMPRESSION: Stable cardiomegaly and chronic interstitial changes.   Electronically Signed   By: Elon Alas   On: 01/18/2014 14:48    Medications:  Prior to Admission:  Prescriptions prior to admission  Medication Sig Dispense Refill  . Amino Acids-Protein Hydrolys (FEEDING SUPPLEMENT, PRO-STAT SUGAR FREE 64,) LIQD Take 30 mLs by mouth 2 (two) times daily.      . bisacodyl (DULCOLAX) 10 MG suppository Place 10 mg rectally as needed for moderate constipation.      Marland Kitchen doxazosin (CARDURA) 2 MG tablet Take 1 tablet by mouth daily.      Marland Kitchen  ENSURE  (ENSURE) Take 237 mLs by mouth 2 (two) times daily.      Marland Kitchen etodolac (LODINE) 400 MG tablet Take 1 tablet by mouth 2 (two) times daily.      . finasteride (PROSCAR) 5 MG tablet Take 1 tablet by mouth daily.      . heparin 5000 UNIT/ML injection Inject 1 mL (5,000 Units total) into the skin every 8 (eight) hours.  1 mL    . HYDROcodone-acetaminophen (NORCO/VICODIN) 5-325 MG per tablet Take 1 tablet by mouth every 4 (four) hours as needed for moderate pain.  30 tablet  0  . LORazepam (ATIVAN) 0.5 MG tablet Take 0.5 mg by mouth every 6 (six) hours as needed for anxiety.      Marland Kitchen omeprazole (PRILOSEC) 20 MG capsule Take 20 mg by mouth daily.      . polyethylene glycol (MIRALAX / GLYCOLAX) packet Take 17 g by mouth daily.      . potassium chloride SA (K-DUR,KLOR-CON) 20 MEQ tablet Take 40 mEq by mouth.      . predniSONE (DELTASONE) 10 MG tablet Take 1 tablet (10 mg total) by mouth daily with breakfast.      . senna-docusate (SENOKOT-S) 8.6-50 MG per tablet Take 1 tablet by mouth 2 (two) times daily.      . sertraline (ZOLOFT) 25 MG tablet Take 25 mg by mouth daily.      Marland Kitchen torsemide (DEMADEX) 20 MG tablet Take 1 tablet by mouth daily.       Scheduled: . doxazosin  2 mg Oral Daily  . feeding supplement (ENSURE COMPLETE)  237 mL Oral BID  . feeding supplement (PRO-STAT SUGAR FREE 64)  30 mL Oral BID  . finasteride  5 mg Oral Daily  . metoprolol tartrate  12.5 mg Oral BID  . pantoprazole  40 mg Oral Daily  . potassium chloride SA  40 mEq Oral Daily  . predniSONE  10 mg Oral Q breakfast  . sertraline  25 mg Oral Daily  . sodium chloride  3 mL Intravenous Q12H  . torsemide  20 mg Oral Daily   Continuous: . sodium chloride 75 mL/hr at 01/19/14 0501  . heparin 1,400 Units/hr (01/18/14 2136)   BBC:WUGQBV chloride, acetaminophen, LORazepam, metoprolol, ondansetron (ZOFRAN) IV, sodium chloride  Assesment: He was admitted with chest pain. He has severe inflammatory polyneuropathy that has limited his  ability to ambulate. He has a history of hypertension. His troponin level was elevated last night. Active Problems:   Chest pain    Plan: Continue with current treatments    LOS: 1 day   Amere Bricco L 01/19/2014, 8:55 AM

## 2014-01-20 ENCOUNTER — Encounter: Payer: Self-pay | Admitting: *Deleted

## 2014-01-20 ENCOUNTER — Other Ambulatory Visit: Payer: Self-pay | Admitting: *Deleted

## 2014-01-20 ENCOUNTER — Telehealth: Payer: Self-pay | Admitting: *Deleted

## 2014-01-20 DIAGNOSIS — R609 Edema, unspecified: Secondary | ICD-10-CM

## 2014-01-20 DIAGNOSIS — R079 Chest pain, unspecified: Secondary | ICD-10-CM

## 2014-01-20 DIAGNOSIS — N390 Urinary tract infection, site not specified: Secondary | ICD-10-CM

## 2014-01-20 LAB — CBC
HEMATOCRIT: 38.8 % — AB (ref 39.0–52.0)
Hemoglobin: 12.3 g/dL — ABNORMAL LOW (ref 13.0–17.0)
MCH: 28.8 pg (ref 26.0–34.0)
MCHC: 31.7 g/dL (ref 30.0–36.0)
MCV: 90.9 fL (ref 78.0–100.0)
PLATELETS: 249 10*3/uL (ref 150–400)
RBC: 4.27 MIL/uL (ref 4.22–5.81)
RDW: 14.8 % (ref 11.5–15.5)
WBC: 13.5 10*3/uL — AB (ref 4.0–10.5)

## 2014-01-20 LAB — LIPID PANEL
Cholesterol: 130 mg/dL (ref 0–200)
HDL: 45 mg/dL (ref >39)
LDL CALC: 66 mg/dL (ref 0–99)
TRIGLYCERIDES: 97 mg/dL (ref <150)
Total CHOL/HDL Ratio: 2.9 RATIO
VLDL: 19 mg/dL (ref 0–40)

## 2014-01-20 LAB — URINALYSIS, ROUTINE W REFLEX MICROSCOPIC
Glucose, UA: NEGATIVE mg/dL
Ketones, ur: NEGATIVE mg/dL
Nitrite: POSITIVE — AB
Protein, ur: 30 mg/dL — AB
Specific Gravity, Urine: 1.015 (ref 1.005–1.030)
UROBILINOGEN UA: 0.2 mg/dL (ref 0.0–1.0)
pH: 7 (ref 5.0–8.0)

## 2014-01-20 LAB — URINE MICROSCOPIC-ADD ON

## 2014-01-20 LAB — TROPONIN I
Troponin I: <0.3 ng/mL (ref <0.30)
Troponin I: <0.3 ng/mL (ref <0.30)
Troponin I: <0.3 ng/mL (ref <0.30)

## 2014-01-20 LAB — HEPARIN LEVEL (UNFRACTIONATED): Heparin Unfractionated: 0.44 IU/mL (ref 0.30–0.70)

## 2014-01-20 LAB — HEMOGLOBIN A1C
Hgb A1c MFr Bld: 5.8 % — ABNORMAL HIGH (ref <5.7)
Mean Plasma Glucose: 120 mg/dL — ABNORMAL HIGH (ref <117)

## 2014-01-20 MED ORDER — PHENAZOPYRIDINE HCL 100 MG PO TABS
100.0000 mg | ORAL_TABLET | Freq: Three times a day (TID) | ORAL | Status: DC
  Administered 2014-01-21: 100 mg via ORAL
  Filled 2014-01-20: qty 1

## 2014-01-20 MED ORDER — BISACODYL 10 MG RE SUPP
10.0000 mg | Freq: Every day | RECTAL | Status: DC | PRN
Start: 2014-01-20 — End: 2014-01-21
  Administered 2014-01-20 – 2014-01-21 (×2): 10 mg via RECTAL
  Filled 2014-01-20 (×2): qty 1

## 2014-01-20 NOTE — Progress Notes (Signed)
Subjective: He says he has not had anymore chest pain. He did have a non-STEMI. He has difficulty with moving around.  Objective: Vital signs in last 24 hours: Temp:  [98.1 F (36.7 C)-98.4 F (36.9 C)] 98.4 F (36.9 C) (08/05 0634) Pulse Rate:  [77-87] 83 (08/05 0634) Resp:  [18-20] 18 (08/05 0634) BP: (103-135)/(59-75) 103/59 mmHg (08/05 0634) SpO2:  [93 %-96 %] 93 % (08/05 0634) Weight change:  Last BM Date: 01/17/14  Intake/Output from previous day: 08/04 0701 - 08/05 0700 In: 2678.8 [P.O.:880; I.V.:1798.8] Out: 3200 [Urine:3200]  PHYSICAL EXAM General appearance: alert, cooperative and no distress Resp: clear to auscultation bilaterally Cardio: regular rate and rhythm, S1, S2 normal, no murmur, click, rub or gallop GI: soft, non-tender; bowel sounds normal; no masses,  no organomegaly Extremities: extremities normal, atraumatic, no cyanosis or edema  Lab Results:  Results for orders placed during the hospital encounter of 01/18/14 (from the past 48 hour(s))  CBC WITH DIFFERENTIAL     Status: Abnormal   Collection Time    01/18/14  2:41 PM      Result Value Ref Range   WBC 13.8 (*) 4.0 - 10.5 K/uL   RBC 4.36  4.22 - 5.81 MIL/uL   Hemoglobin 12.7 (*) 13.0 - 17.0 g/dL   HCT 39.6  39.0 - 52.0 %   MCV 90.8  78.0 - 100.0 fL   MCH 29.1  26.0 - 34.0 pg   MCHC 32.1  30.0 - 36.0 g/dL   RDW 14.6  11.5 - 15.5 %   Platelets 282  150 - 400 K/uL   Neutrophils Relative % 84 (*) 43 - 77 %   Neutro Abs 11.7 (*) 1.7 - 7.7 K/uL   Lymphocytes Relative 11 (*) 12 - 46 %   Lymphs Abs 1.5  0.7 - 4.0 K/uL   Monocytes Relative 5  3 - 12 %   Monocytes Absolute 0.6  0.1 - 1.0 K/uL   Eosinophils Relative 0  0 - 5 %   Eosinophils Absolute 0.0  0.0 - 0.7 K/uL   Basophils Relative 0  0 - 1 %   Basophils Absolute 0.0  0.0 - 0.1 K/uL  BASIC METABOLIC PANEL     Status: Abnormal   Collection Time    01/18/14  2:41 PM      Result Value Ref Range   Sodium 137  137 - 147 mEq/L   Potassium 4.6   3.7 - 5.3 mEq/L   Chloride 97  96 - 112 mEq/L   CO2 28  19 - 32 mEq/L   Glucose, Bld 247 (*) 70 - 99 mg/dL   BUN 25 (*) 6 - 23 mg/dL   Creatinine, Ser 0.59  0.50 - 1.35 mg/dL   Calcium 9.6  8.4 - 10.5 mg/dL   GFR calc non Af Amer >90  >90 mL/min   GFR calc Af Amer >90  >90 mL/min   Comment: (NOTE)     The eGFR has been calculated using the CKD EPI equation.     This calculation has not been validated in all clinical situations.     eGFR's persistently <90 mL/min signify possible Chronic Kidney     Disease.   Anion gap 12  5 - 15  TROPONIN I     Status: None   Collection Time    01/18/14  2:41 PM      Result Value Ref Range   Troponin I <0.30  <0.30 ng/mL   Comment:  Due to the release kinetics of cTnI,     a negative result within the first hours     of the onset of symptoms does not rule out     myocardial infarction with certainty.     If myocardial infarction is still suspected,     repeat the test at appropriate intervals.  TROPONIN I     Status: Abnormal   Collection Time    01/18/14  5:38 PM      Result Value Ref Range   Troponin I 0.40 (*) <0.30 ng/mL   Comment:            Due to the release kinetics of cTnI,     a negative result within the first hours     of the onset of symptoms does not rule out     myocardial infarction with certainty.     If myocardial infarction is still suspected,     repeat the test at appropriate intervals.     CRITICAL RESULT CALLED TO, READ BACK BY AND VERIFIED WITH:     MORRIS,C ON 01/18/14 AT 1817 BY LOY,C  MRSA PCR SCREENING     Status: None   Collection Time    01/18/14  6:54 PM      Result Value Ref Range   MRSA by PCR NEGATIVE  NEGATIVE   Comment:            The GeneXpert MRSA Assay (FDA     approved for NASAL specimens     only), is one component of a     comprehensive MRSA colonization     surveillance program. It is not     intended to diagnose MRSA     infection nor to guide or     monitor treatment for      MRSA infections.  TROPONIN I     Status: Abnormal   Collection Time    01/18/14  8:10 PM      Result Value Ref Range   Troponin I 0.53 (*) <0.30 ng/mL   Comment:            Due to the release kinetics of cTnI,     a negative result within the first hours     of the onset of symptoms does not rule out     myocardial infarction with certainty.     If myocardial infarction is still suspected,     repeat the test at appropriate intervals.     CRITICAL VALUE NOTED.  VALUE IS CONSISTENT WITH PREVIOUSLY REPORTED AND CALLED VALUE.  PROTIME-INR     Status: None   Collection Time    01/18/14  9:09 PM      Result Value Ref Range   Prothrombin Time 13.3  11.6 - 15.2 seconds   INR 1.01  0.00 - 1.49  TROPONIN I     Status: Abnormal   Collection Time    01/18/14 11:06 PM      Result Value Ref Range   Troponin I 0.60 (*) <0.30 ng/mL   Comment:            Due to the release kinetics of cTnI,     a negative result within the first hours     of the onset of symptoms does not rule out     myocardial infarction with certainty.     If myocardial infarction is still suspected,     repeat the test at appropriate intervals.  CRITICAL VALUE NOTED.  VALUE IS CONSISTENT WITH PREVIOUSLY REPORTED AND CALLED VALUE.  CBC     Status: Abnormal   Collection Time    01/19/14  6:23 AM      Result Value Ref Range   WBC 14.5 (*) 4.0 - 10.5 K/uL   RBC 4.32  4.22 - 5.81 MIL/uL   Hemoglobin 12.7 (*) 13.0 - 17.0 g/dL   HCT 67.2  27.7 - 37.5 %   MCV 92.4  78.0 - 100.0 fL   MCH 29.4  26.0 - 34.0 pg   MCHC 31.8  30.0 - 36.0 g/dL   RDW 05.1  07.1 - 25.2 %   Platelets 281  150 - 400 K/uL  HEPARIN LEVEL (UNFRACTIONATED)     Status: None   Collection Time    01/19/14  6:23 AM      Result Value Ref Range   Heparin Unfractionated 0.68  0.30 - 0.70 IU/mL   Comment:            IF HEPARIN RESULTS ARE BELOW     EXPECTED VALUES, AND PATIENT     DOSAGE HAS BEEN CONFIRMED,     SUGGEST FOLLOW UP TESTING     OF  ANTITHROMBIN III LEVELS.  TROPONIN I     Status: None   Collection Time    01/19/14 10:09 AM      Result Value Ref Range   Troponin I <0.30  <0.30 ng/mL   Comment:            Due to the release kinetics of cTnI,     a negative result within the first hours     of the onset of symptoms does not rule out     myocardial infarction with certainty.     If myocardial infarction is still suspected,     repeat the test at appropriate intervals.  TROPONIN I     Status: None   Collection Time    01/19/14  3:48 PM      Result Value Ref Range   Troponin I <0.30  <0.30 ng/mL   Comment:            Due to the release kinetics of cTnI,     a negative result within the first hours     of the onset of symptoms does not rule out     myocardial infarction with certainty.     If myocardial infarction is still suspected,     repeat the test at appropriate intervals.  TROPONIN I     Status: None   Collection Time    01/19/14  9:34 PM      Result Value Ref Range   Troponin I <0.30  <0.30 ng/mL   Comment:            Due to the release kinetics of cTnI,     a negative result within the first hours     of the onset of symptoms does not rule out     myocardial infarction with certainty.     If myocardial infarction is still suspected,     repeat the test at appropriate intervals.  LIPID PANEL     Status: None   Collection Time    01/20/14  3:51 AM      Result Value Ref Range   Cholesterol 130  0 - 200 mg/dL   Triglycerides 97  <479 mg/dL   HDL 45  >98 mg/dL   Total CHOL/HDL Ratio  2.9     VLDL 19  0 - 40 mg/dL   LDL Cholesterol 66  0 - 99 mg/dL   Comment:            Total Cholesterol/HDL:CHD Risk     Coronary Heart Disease Risk Table                         Men   Women      1/2 Average Risk   3.4   3.3      Average Risk       5.0   4.4      2 X Average Risk   9.6   7.1      3 X Average Risk  23.4   11.0                Use the calculated Patient Ratio     above and the CHD Risk Table      to determine the patient's CHD Risk.                ATP III CLASSIFICATION (LDL):      <100     mg/dL   Optimal      161-096  mg/dL   Near or Above                        Optimal      130-159  mg/dL   Borderline      045-409  mg/dL   High      >811     mg/dL   Very High  TROPONIN I     Status: None   Collection Time    01/20/14  3:51 AM      Result Value Ref Range   Troponin I <0.30  <0.30 ng/mL   Comment:            Due to the release kinetics of cTnI,     a negative result within the first hours     of the onset of symptoms does not rule out     myocardial infarction with certainty.     If myocardial infarction is still suspected,     repeat the test at appropriate intervals.    ABGS No results found for this basename: PHART, PCO2, PO2ART, TCO2, HCO3,  in the last 72 hours CULTURES Recent Results (from the past 240 hour(s))  MRSA PCR SCREENING     Status: None   Collection Time    01/18/14  6:54 PM      Result Value Ref Range Status   MRSA by PCR NEGATIVE  NEGATIVE Final   Comment:            The GeneXpert MRSA Assay (FDA     approved for NASAL specimens     only), is one component of a     comprehensive MRSA colonization     surveillance program. It is not     intended to diagnose MRSA     infection nor to guide or     monitor treatment for     MRSA infections.   Studies/Results: Dg Chest Portable 1 View  01/18/2014   CLINICAL DATA:  Chest pain.  EXAM: PORTABLE CHEST - 1 VIEW  COMPARISON:  Chest radiograph Nov 12, 2013  FINDINGS: The cardiac silhouette appears at least mildly enlarged, even with consideration to this low inspiratory examination. Similar moderate chronic interstitial  changes without pleural effusions or focal consolidation. No pneumothorax. Interval removal of right PICC. Soft tissue planes and included osseous structures are unchanged, high-riding right humeral head with dystrophic calcifications at right [facility] joint.  IMPRESSION: Stable cardiomegaly and  chronic interstitial changes.   Electronically Signed   By: Elon Alas   On: 01/18/2014 14:48    Medications:  Prior to Admission:  Prescriptions prior to admission  Medication Sig Dispense Refill  . Amino Acids-Protein Hydrolys (FEEDING SUPPLEMENT, PRO-STAT SUGAR FREE 64,) LIQD Take 30 mLs by mouth 2 (two) times daily.      . bisacodyl (DULCOLAX) 10 MG suppository Place 10 mg rectally as needed for moderate constipation.      Marland Kitchen doxazosin (CARDURA) 2 MG tablet Take 1 tablet by mouth daily.      Marland Kitchen ENSURE (ENSURE) Take 237 mLs by mouth 2 (two) times daily.      Marland Kitchen etodolac (LODINE) 400 MG tablet Take 1 tablet by mouth 2 (two) times daily.      . finasteride (PROSCAR) 5 MG tablet Take 1 tablet by mouth daily.      . heparin 5000 UNIT/ML injection Inject 1 mL (5,000 Units total) into the skin every 8 (eight) hours.  1 mL    . HYDROcodone-acetaminophen (NORCO/VICODIN) 5-325 MG per tablet Take 1 tablet by mouth every 4 (four) hours as needed for moderate pain.  30 tablet  0  . LORazepam (ATIVAN) 0.5 MG tablet Take 0.5 mg by mouth every 6 (six) hours as needed for anxiety.      Marland Kitchen omeprazole (PRILOSEC) 20 MG capsule Take 20 mg by mouth daily.      . polyethylene glycol (MIRALAX / GLYCOLAX) packet Take 17 g by mouth daily.      . potassium chloride SA (K-DUR,KLOR-CON) 20 MEQ tablet Take 40 mEq by mouth.      . predniSONE (DELTASONE) 10 MG tablet Take 1 tablet (10 mg total) by mouth daily with breakfast.      . senna-docusate (SENOKOT-S) 8.6-50 MG per tablet Take 1 tablet by mouth 2 (two) times daily.      . sertraline (ZOLOFT) 25 MG tablet Take 25 mg by mouth daily.      Marland Kitchen torsemide (DEMADEX) 20 MG tablet Take 1 tablet by mouth daily.       Scheduled: . aspirin  325 mg Oral Daily  . atorvastatin  40 mg Oral q1800  . feeding supplement (ENSURE COMPLETE)  237 mL Oral BID  . feeding supplement (PRO-STAT SUGAR FREE 64)  30 mL Oral BID  . finasteride  5 mg Oral Daily  . lisinopril  2.5 mg Oral  Daily  . metoprolol tartrate  12.5 mg Oral BID  . pantoprazole  40 mg Oral Daily  . potassium chloride SA  40 mEq Oral Daily  . predniSONE  10 mg Oral Q breakfast  . sertraline  25 mg Oral Daily  . sodium chloride  3 mL Intravenous Q12H  . torsemide  20 mg Oral Daily   Continuous: . sodium chloride 75 mL/hr at 01/19/14 2204   ZLD:[id number] chloride, acetaminophen, LORazepam, metoprolol, ondansetron (ZOFRAN) IV, sodium chloride  Assesment: He had a non-STEMI. He has severe neuropathy and is not able to walk. He can come off of heparin go back to regular diet to and then see how he does. If he does well I will send him back to the nursing home tomorrow Active Problems:   Chest pain   Elevated troponin  Plan: Regular diet potentially back to nursing home tomorrow. Discontinue heparin.    LOS: 2 days   Knowledge Escandon L 01/20/2014, 8:58 AM

## 2014-01-20 NOTE — Progress Notes (Signed)
Consulting cardiologist: Kate Sable MD Primary Cardiologist: New  Subjective:    No complaints of recurrent chest pain. He complains of dysuria. Feeling pressure in abdomen and burning feeling in urethra, with foley in place.   Objective:   Temp:  [98.1 F (36.7 C)-98.4 F (36.9 C)] 98.4 F (36.9 C) (08/05 0634) Pulse Rate:  [77-87] 83 (08/05 0634) Resp:  [18-20] 18 (08/05 0634) BP: (103-135)/(59-75) 103/59 mmHg (08/05 0634) SpO2:  [93 %-96 %] 93 % (08/05 0634) Last BM Date: 01/17/14  Filed Weights   01/18/14 1427 01/18/14 1700 01/19/14 0455  Weight: 220 lb (99.791 kg) 220 lb (99.79 kg) 226 lb 9.6 oz (102.785 kg)    Intake/Output Summary (Last 24 hours) at 01/20/14 0823 Last data filed at 01/20/14 0645  Gross per 24 hour  Intake 2678.75 ml  Output   3200 ml  Net -521.25 ml    Telemetry:NSR rate in the 80's.   Exam:  General: No acute distress.  HEENT: Conjunctiva and lids normal, oropharynx clear.  Lungs: Clear to auscultation, nonlabored.  Cardiac: No elevated JVP or bruits. RRR, no gallop or rub.   Abdomen: Normoactive bowel sounds, nontender, nondistended.  Extremities: No pitting edema, distal pulses full.  Neuropsychiatric: Alert and oriented x3, affect appropriate.   Lab Results:  Basic Metabolic Panel:  Recent Labs Lab 01/18/14 1441  NA 137  K 4.6  CL 97  CO2 28  GLUCOSE 247*  BUN 25*  CREATININE 0.59  CALCIUM 9.6   CBC:  Recent Labs Lab 01/18/14 1441 01/19/14 0623  WBC 13.8* 14.5*  HGB 12.7* 12.7*  HCT 39.6 39.9  MCV 90.8 92.4  PLT 282 281    Cardiac Enzymes:  Recent Labs Lab 01/19/14 1548 01/19/14 2134 01/20/14 0351  TROPONINI <0.30 <0.30 <0.30    BNP:  Recent Labs  11/02/13 1257 11/03/13 0517  PROBNP 139.9 146.3    Coagulation:  Recent Labs Lab 01/18/14 2109  INR 1.01   Echocardiogram; 10/2013 Procedure narrative: Transthoracic echocardiography. Image quality was suboptimal. The study was  technically difficult, as a result of poor sound wave transmission. - Left ventricle: The cavity size was normal. Wall thickness was increased in a pattern of moderate LVH. Systolic function was normal. The estimated ejection fraction was in the range of 60% to 65%. There was an increased relative contribution of atrial contraction to ventricular filling. Doppler parameters are consistent with abnormal left ventricular relaxation (grade 1 diastolic dysfunction). - Aortic valve: Mildly calcified annulus. - Aorta: Mild aortic root dilatation. Aortic root dimension: 4.2 cm (ED). - Left atrium: The atrium was mildly dilated. - Pulmonic valve: There was mild regurgitation.   Radiology: Dg Chest Portable 1 View  01/18/2014   CLINICAL DATA:  Chest pain.  EXAM: PORTABLE CHEST - 1 VIEW  COMPARISON:  Chest radiograph Nov 12, 2013  FINDINGS: The cardiac silhouette appears at least mildly enlarged, even with consideration to this low inspiratory examination. Similar moderate chronic interstitial changes without pleural effusions or focal consolidation. No pneumothorax. Interval removal of right PICC. Soft tissue planes and included osseous structures are unchanged, high-riding right humeral head with dystrophic calcifications at right [facility] joint.  IMPRESSION: Stable cardiomegaly and chronic interstitial changes.   Electronically Signed   By: Elon Alas   On: 01/18/2014 14:48   :   Medications:   Scheduled Medications: . aspirin  325 mg Oral Daily  . atorvastatin  40 mg Oral q1800  . feeding supplement (ENSURE COMPLETE)  237 mL  Oral BID  . feeding supplement (PRO-STAT SUGAR FREE 64)  30 mL Oral BID  . finasteride  5 mg Oral Daily  . lisinopril  2.5 mg Oral Daily  . metoprolol tartrate  12.5 mg Oral BID  . pantoprazole  40 mg Oral Daily  . potassium chloride SA  40 mEq Oral Daily  . predniSONE  10 mg Oral Q breakfast  . sertraline  25 mg Oral Daily  . sodium chloride  3 mL Intravenous Q12H    . torsemide  20 mg Oral Daily    Infusions: . sodium chloride 75 mL/hr at 01/19/14 2204  . heparin 1,400 Units/hr (01/19/14 1236)    PRN Medications: sodium chloride, acetaminophen, LORazepam, metoprolol, ondansetron (ZOFRAN) IV, sodium chloride   Assessment and Plan:   1. NSTEMI: Troponin's have been negative over the last 24 hours. I will discontinue heparin. He denies any recurrent chest pain. Continue current medication regimen. No plans for inpatient stress testing this am. OP is planned. Will schedule this through our office and make follow-up appointment with [place] for discussion of test results. Continue metoprolol and ACE and ASA.  2. Hypertension: Currently well-controlled, low normal.   3. Neuromuscular Disease: Continue therapy at [facility]. Dr. Luan Pulling plans discharge back to [facility] tomorrow.   4. Dysuria: Indwelling catheter. Will check UA. Phill Myron. Elianne Gubser NP  01/20/2014, 8:23 AM

## 2014-01-20 NOTE — Telephone Encounter (Signed)
Pt in Hospital at this time. Per Dr Bronson Ing for pt to get Lexiscan in 1 week with f/u. Faxed pt Lexiscan instructions and follow up date to the nurse on the floor to give to pt.

## 2014-01-20 NOTE — Progress Notes (Signed)
The patient was seen and examined, and I agree with the assessment and plan as documented above, with modifications as noted below. Last three troponins have remained normal. No further chest pain. Only complaint is dysuria and UA consistent with UTI. Will defer treatment to primary. Continue ASA, statin, beta blocker, and ACEI. Will plan for outpatient stress testing, and I will f/u with him thereafter. Will sign off. Please call with questions.

## 2014-01-21 LAB — TROPONIN I
Troponin I: <0.3 ng/mL (ref <0.30)
Troponin I: <0.3 ng/mL (ref <0.30)

## 2014-01-21 MED ORDER — ATORVASTATIN CALCIUM 40 MG PO TABS: 40.0000 mg | ORAL_TABLET | Freq: Every day | ORAL | Status: DC

## 2014-01-21 MED ORDER — LISINOPRIL 2.5 MG PO TABS: 2.5000 mg | ORAL_TABLET | Freq: Every day | ORAL | Status: AC

## 2014-01-21 MED ORDER — ASPIRIN 325 MG PO TABS: 325.0000 mg | ORAL_TABLET | Freq: Every day | ORAL | Status: AC

## 2014-01-21 MED ORDER — METOPROLOL TARTRATE 12.5 MG HALF TABLET: 12.5000 mg | ORAL_TABLET | Freq: Two times a day (BID) | ORAL | Status: DC

## 2014-01-21 NOTE — Discharge Summary (Signed)
Physician Discharge Summary  Patient ID: Dylan Nelson MRN: [id number] DOB/AGE: 01-18-1934 80 y.o. Primary Care Physician:Tanee Henery L, MD Admit date: 01/18/2014 Discharge date: 01/21/2014    Discharge Diagnoses:  Non-STEMI Active Problems:   Chest pain   Elevated troponin  Bph Chronic demyelinating inflammatory polyneuropathy Hypertension Constipation   Medication List    STOP taking these medications       doxazosin 2 MG tablet  Commonly known as:  CARDURA     etodolac 400 MG tablet  Commonly known as:  LODINE      TAKE these medications       aspirin 325 MG tablet  Take 1 tablet (325 mg total) by mouth daily.     atorvastatin 40 MG tablet  Commonly known as:  LIPITOR  Take 1 tablet (40 mg total) by mouth daily at 6 PM.     bisacodyl 10 MG suppository  Commonly known as:  DULCOLAX  Place 10 mg rectally as needed for moderate constipation.     ENSURE  Take 237 mLs by mouth 2 (two) times daily.     feeding supplement (PRO-STAT SUGAR FREE 64) Liqd  Take 30 mLs by mouth 2 (two) times daily.     finasteride 5 MG tablet  Commonly known as:  PROSCAR  Take 1 tablet by mouth daily.     heparin 5000 UNIT/ML injection  Inject 1 mL (5,000 Units total) into the skin every 8 (eight) hours.     HYDROcodone-acetaminophen 5-325 MG per tablet  Commonly known as:  NORCO/VICODIN  Take 1 tablet by mouth every 4 (four) hours as needed for moderate pain.     lisinopril 2.5 MG tablet  Commonly known as:  PRINIVIL,ZESTRIL  Take 1 tablet (2.5 mg total) by mouth daily.     LORazepam 0.5 MG tablet  Commonly known as:  ATIVAN  Take 0.5 mg by mouth every 6 (six) hours as needed for anxiety.     metoprolol tartrate 12.5 mg Tabs tablet  Commonly known as:  LOPRESSOR  Take 0.5 tablets (12.5 mg total) by mouth 2 (two) times daily.     omeprazole 20 MG capsule  Commonly known as:  PRILOSEC  Take 20 mg by mouth daily.     polyethylene glycol packet  Commonly known as:   MIRALAX / GLYCOLAX  Take 17 g by mouth daily.     potassium chloride SA 20 MEQ tablet  Commonly known as:  K-DUR,KLOR-CON  Take 40 mEq by mouth.     predniSONE 10 MG tablet  Commonly known as:  DELTASONE  Take 1 tablet (10 mg total) by mouth daily with breakfast.     senna-docusate 8.6-50 MG per tablet  Commonly known as:  Senokot-S  Take 1 tablet by mouth 2 (two) times daily.     sertraline 25 MG tablet  Commonly known as:  ZOLOFT  Take 25 mg by mouth daily.     torsemide 20 MG tablet  Commonly known as:  DEMADEX  Take 1 tablet by mouth daily.        Discharged Condition: Improved    Consults: Cardiology  Significant Diagnostic Studies: Dg Chest Portable 1 View  01/18/2014   CLINICAL DATA:  Chest pain.  EXAM: PORTABLE CHEST - 1 VIEW  COMPARISON:  Chest radiograph Nov 12, 2013  FINDINGS: The cardiac silhouette appears at least mildly enlarged, even with consideration to this low inspiratory examination. Similar moderate chronic interstitial changes without pleural effusions or focal consolidation. No pneumothorax. Interval  removal of right PICC. Soft tissue planes and included osseous structures are unchanged, high-riding right humeral head with dystrophic calcifications at right [facility] joint.  IMPRESSION: Stable cardiomegaly and chronic interstitial changes.   Electronically Signed   By: Elon Alas   On: 01/18/2014 14:48    Lab Results: Basic Metabolic Panel:  Recent Labs  01/18/14 1441  NA 137  K 4.6  CL 97  CO2 28  GLUCOSE 247*  BUN 25*  CREATININE 0.59  CALCIUM 9.6   Liver Function Tests: No results found for this basename: AST, ALT, ALKPHOS, BILITOT, PROT, ALBUMIN,  in the last 72 hours   CBC:  Recent Labs  01/18/14 1441 01/19/14 0623 01/20/14 0919  WBC 13.8* 14.5* 13.5*  NEUTROABS 11.7*  --   --   HGB 12.7* 12.7* 12.3*  HCT 39.6 39.9 38.8*  MCV 90.8 92.4 90.9  PLT 282 281 249    Recent Results (from the past 240 hour(s))  MRSA PCR  SCREENING     Status: None   Collection Time    01/18/14  6:54 PM      Result Value Ref Range Status   MRSA by PCR NEGATIVE  NEGATIVE Final   Comment:            The GeneXpert MRSA Assay (FDA     approved for NASAL specimens     only), is one component of a     comprehensive MRSA colonization     surveillance program. It is not     intended to diagnose MRSA     infection nor to guide or     monitor treatment for     MRSA infections.     Hospital Course: This is a 80 year old who was in his usual state of poor health at a skilled care facility where he was doing rehabilitation for chronic polyneuropathy. He had finished physical therapy and developed chest pain it was substernal and went into both shoulder blades. This lasted for about 20 minutes. He was brought to the emergency department and evaluated. His initial troponin was negative but later became positive. He had cardiology consultation it was felt that he had NSTEMI. His medications were altered. It was felt that he was not a candidate for inpatient workup but that that could be accomplished as an outpatient. He had been heparinized and this was discontinued and he had no further chest pain.  Discharge Exam: Blood pressure 95/51, pulse 76, temperature 98.7 F (37.1 C), temperature source Oral, resp. rate 20, height 6\' 1"  (1.854 m), weight 102.785 kg (226 lb 9.6 oz), SpO2 94.00%. He is awake and alert. His extremities are very weak. His chest is clear. His heart is regular  Disposition: Back to skilled care facility he will have outpatient cardiology evaluation      Discharge Instructions   Discharge to SNF when bed available    Complete by:  As directed              Signed: Shavonta Gossen L   01/21/2014, 9:11 AM

## 2014-01-21 NOTE — Progress Notes (Signed)
He says he feels better. He has not had any chest pain. He will be transferred to skilled care facility and complete his cardiac workup as an outpatient

## 2014-01-21 NOTE — Progress Notes (Signed)
Report called to nurse Lerry Liner at [facility].

## 2014-01-21 NOTE — Clinical Social Work Note (Signed)
Pt d/c today back to [facility]. Pt, pt's wife, and facility aware and agreeable. D/C summary faxed. Pt to transport via [facility].  Benay Pike, [place]

## 2014-01-25 ENCOUNTER — Encounter (HOSPITAL_COMMUNITY)
Admission: RE | Admit: 2014-01-25 | Discharge: 2014-01-25 | Disposition: A | Discharge status: Home / Self Care | Payer: Medicare Other | Source: Ambulatory Visit | Attending: Cardiovascular Disease | Admitting: Cardiovascular Disease

## 2014-01-25 ENCOUNTER — Encounter (HOSPITAL_COMMUNITY): Payer: Self-pay

## 2014-01-25 ENCOUNTER — Ambulatory Visit (HOSPITAL_COMMUNITY)
Admit: 2014-01-25 | Discharge: 2014-01-25 | Disposition: A | Discharge status: Home / Self Care | Payer: Medicare Other | Source: Ambulatory Visit | Attending: Cardiovascular Disease | Admitting: Cardiovascular Disease

## 2014-01-25 DIAGNOSIS — R079 Chest pain, unspecified: Secondary | ICD-10-CM

## 2014-01-25 MED ORDER — SODIUM CHLORIDE 0.9 % IJ SOLN
10.0000 mL | INTRAMUSCULAR | Status: DC | PRN
  Administered 2014-01-25: 10 mL via INTRAVENOUS

## 2014-01-25 MED ORDER — TECHNETIUM TC 99M SESTAMIBI GENERIC - CARDIOLITE
30.0000 | Freq: Once | INTRAVENOUS | Status: AC | PRN
  Administered 2014-01-25: 30 via INTRAVENOUS

## 2014-01-25 MED ORDER — TECHNETIUM TC 99M SESTAMIBI - CARDIOLITE
10.0000 | Freq: Once | INTRAVENOUS | Status: AC | PRN
  Administered 2014-01-25: 10 via INTRAVENOUS

## 2014-01-25 MED ORDER — REGADENOSON 0.4 MG/5ML IV SOLN
INTRAVENOUS | Status: AC
  Administered 2014-01-25: 0.4 mg via INTRAVENOUS
  Filled 2014-01-25: qty 5

## 2014-01-25 MED ORDER — REGADENOSON 0.4 MG/5ML IV SOLN
0.4000 mg | Freq: Once | INTRAVENOUS | Status: AC | PRN
  Administered 2014-01-25: 0.4 mg via INTRAVENOUS

## 2014-01-25 MED ORDER — SODIUM CHLORIDE 0.9 % IJ SOLN
INTRAMUSCULAR | Status: AC
  Administered 2014-01-25: 10 mL via INTRAVENOUS
  Filled 2014-01-25: qty 10

## 2014-01-25 NOTE — Progress Notes (Signed)
Stress Lab Nurses Notes - [place] 01/25/2014 Reason for doing test: Chest Pain Type of test: Wille Glaser Nurse performing test: Gerrit Halls, RN Nuclear Medicine Tech: Melburn Hake Echo Tech: Not Applicable MD performing test: Dr. Lynann Beaver.Purcell Nails NP Family MD: Luan Pulling Test explained and consent signed: Yes.   IV started: Saline lock flushed, No redness or edema and Saline lock started in radiology Symptoms: SOB Treatment/Intervention: None Reason test stopped: protocol completed After recovery IV was: Discontinued via X-ray tech and No redness or edema Patient to return to Nuc. Med at : 11:30 Patient discharged: Home Patient's Condition upon discharge was: stable Comments: During test BP 87/54 & HR 126.  Recovery BP 98/55 & HR 121.  Symptoms resolved in recovery. Geanie Cooley T

## 2014-01-26 ENCOUNTER — Encounter (HOSPITAL_COMMUNITY): Payer: Medicare Other

## 2014-01-26 ENCOUNTER — Ambulatory Visit (HOSPITAL_COMMUNITY): Payer: Medicare Other

## 2014-01-27 ENCOUNTER — Ambulatory Visit (HOSPITAL_COMMUNITY): Payer: Medicare Other

## 2014-01-27 ENCOUNTER — Encounter (HOSPITAL_COMMUNITY): Payer: Medicare Other

## 2014-02-05 ENCOUNTER — Other Ambulatory Visit (HOSPITAL_COMMUNITY): Payer: Self-pay | Admitting: Internal Medicine

## 2014-02-05 DIAGNOSIS — R1030 Lower abdominal pain, unspecified: Secondary | ICD-10-CM

## 2014-02-08 ENCOUNTER — Encounter: Payer: Medicare Other | Admitting: Adult Health

## 2014-02-09 ENCOUNTER — Emergency Department (HOSPITAL_COMMUNITY): Payer: Medicare Other

## 2014-02-09 ENCOUNTER — Emergency Department (HOSPITAL_COMMUNITY)
Admission: EM | Admit: 2014-02-09 | Discharge: 2014-02-09 | Disposition: A | Discharge status: Home / Self Care | Payer: Medicare Other | Attending: Emergency Medicine | Admitting: Emergency Medicine

## 2014-02-09 ENCOUNTER — Encounter (HOSPITAL_COMMUNITY): Payer: Self-pay | Admitting: Emergency Medicine

## 2014-02-09 DIAGNOSIS — I10 Essential (primary) hypertension: Secondary | ICD-10-CM | POA: Diagnosis not present

## 2014-02-09 DIAGNOSIS — R4182 Altered mental status, unspecified: Secondary | ICD-10-CM | POA: Insufficient documentation

## 2014-02-09 DIAGNOSIS — Z87891 Personal history of nicotine dependence: Secondary | ICD-10-CM | POA: Insufficient documentation

## 2014-02-09 DIAGNOSIS — F329 Major depressive disorder, single episode, unspecified: Secondary | ICD-10-CM | POA: Diagnosis not present

## 2014-02-09 DIAGNOSIS — I252 Old myocardial infarction: Secondary | ICD-10-CM | POA: Diagnosis not present

## 2014-02-09 DIAGNOSIS — K5900 Constipation, unspecified: Secondary | ICD-10-CM | POA: Insufficient documentation

## 2014-02-09 DIAGNOSIS — M5030 Other cervical disc degeneration, unspecified cervical region: Secondary | ICD-10-CM | POA: Insufficient documentation

## 2014-02-09 DIAGNOSIS — G6181 Chronic inflammatory demyelinating polyneuritis: Secondary | ICD-10-CM | POA: Insufficient documentation

## 2014-02-09 DIAGNOSIS — Z79899 Other long term (current) drug therapy: Secondary | ICD-10-CM | POA: Insufficient documentation

## 2014-02-09 DIAGNOSIS — IMO0002 Reserved for concepts with insufficient information to code with codable children: Secondary | ICD-10-CM | POA: Insufficient documentation

## 2014-02-09 DIAGNOSIS — Z7982 Long term (current) use of aspirin: Secondary | ICD-10-CM | POA: Diagnosis not present

## 2014-02-09 DIAGNOSIS — N400 Enlarged prostate without lower urinary tract symptoms: Secondary | ICD-10-CM | POA: Insufficient documentation

## 2014-02-09 DIAGNOSIS — N390 Urinary tract infection, site not specified: Secondary | ICD-10-CM

## 2014-02-09 HISTORY — DX: Constipation, unspecified: K59.00

## 2014-02-09 HISTORY — DX: Non-ST elevation (NSTEMI) myocardial infarction: I21.4

## 2014-02-09 HISTORY — DX: Major depressive disorder, single episode, unspecified: F32.9

## 2014-02-09 HISTORY — DX: Dysphagia, unspecified: R13.10

## 2014-02-09 HISTORY — DX: Benign prostatic hyperplasia without lower urinary tract symptoms: N40.0

## 2014-02-09 LAB — URINALYSIS, ROUTINE W REFLEX MICROSCOPIC
Bilirubin Urine: NEGATIVE
Glucose, UA: NEGATIVE mg/dL
Hgb urine dipstick: NEGATIVE
Ketones, ur: NEGATIVE mg/dL
NITRITE: NEGATIVE
Protein, ur: NEGATIVE mg/dL
SPECIFIC GRAVITY, URINE: 1.01 (ref 1.005–1.030)
UROBILINOGEN UA: 0.2 mg/dL (ref 0.0–1.0)
pH: 5.5 (ref 5.0–8.0)

## 2014-02-09 LAB — COMPREHENSIVE METABOLIC PANEL
ALT: 22 U/L (ref 0–53)
ANION GAP: 14 (ref 5–15)
AST: 22 U/L (ref 0–37)
Albumin: 2 g/dL — ABNORMAL LOW (ref 3.5–5.2)
Alkaline Phosphatase: 88 U/L (ref 39–117)
BUN: 25 mg/dL — AB (ref 6–23)
CALCIUM: 8.4 mg/dL (ref 8.4–10.5)
CO2: 21 mEq/L (ref 19–32)
Chloride: 99 mEq/L (ref 96–112)
Creatinine, Ser: 0.73 mg/dL (ref 0.50–1.35)
GFR calc non Af Amer: 87 mL/min — ABNORMAL LOW (ref >90)
Glucose, Bld: 181 mg/dL — ABNORMAL HIGH (ref 70–99)
Potassium: 4.2 mEq/L (ref 3.7–5.3)
Sodium: 134 mEq/L — ABNORMAL LOW (ref 137–147)
TOTAL PROTEIN: 6.1 g/dL (ref 6.0–8.3)
Total Bilirubin: 0.2 mg/dL — ABNORMAL LOW (ref 0.3–1.2)

## 2014-02-09 LAB — CBC WITH DIFFERENTIAL/PLATELET
Basophils Absolute: 0 10*3/uL (ref 0.0–0.1)
Basophils Relative: 0 % (ref 0–1)
EOS ABS: 0 10*3/uL (ref 0.0–0.7)
EOS PCT: 0 % (ref 0–5)
HCT: 34.2 % — ABNORMAL LOW (ref 39.0–52.0)
HEMOGLOBIN: 11.3 g/dL — AB (ref 13.0–17.0)
Lymphocytes Relative: 9 % — ABNORMAL LOW (ref 12–46)
Lymphs Abs: 1 10*3/uL (ref 0.7–4.0)
MCH: 28.3 pg (ref 26.0–34.0)
MCHC: 33 g/dL (ref 30.0–36.0)
MCV: 85.5 fL (ref 78.0–100.0)
MONOS PCT: 6 % (ref 3–12)
Monocytes Absolute: 0.6 10*3/uL (ref 0.1–1.0)
Neutro Abs: 9.1 10*3/uL — ABNORMAL HIGH (ref 1.7–7.7)
Neutrophils Relative %: 85 % — ABNORMAL HIGH (ref 43–77)
PLATELETS: 302 10*3/uL (ref 150–400)
RBC: 4 MIL/uL — AB (ref 4.22–5.81)
RDW: 14.7 % (ref 11.5–15.5)
WBC: 10.7 10*3/uL — ABNORMAL HIGH (ref 4.0–10.5)

## 2014-02-09 LAB — LACTIC ACID, PLASMA: LACTIC ACID, VENOUS: 1.2 mmol/L (ref 0.5–2.2)

## 2014-02-09 LAB — URINE MICROSCOPIC-ADD ON

## 2014-02-09 MED ORDER — IOHEXOL 300 MG/ML  SOLN
100.0000 mL | Freq: Once | INTRAMUSCULAR | Status: AC | PRN
  Administered 2014-02-09: 100 mL via INTRAVENOUS

## 2014-02-09 MED ORDER — CEPHALEXIN 500 MG PO CAPS: 500.0000 mg | ORAL_CAPSULE | Freq: Four times a day (QID) | ORAL | Status: DC

## 2014-02-09 MED ORDER — CEFTRIAXONE SODIUM 1 G IJ SOLR
1.0000 g | Freq: Once | INTRAMUSCULAR | Status: AC
  Administered 2014-02-09: 1 g via INTRAVENOUS
  Filled 2014-02-09: qty 10

## 2014-02-09 MED ORDER — SODIUM CHLORIDE 0.9 % IV BOLUS (SEPSIS)
500.0000 mL | Freq: Once | INTRAVENOUS | Status: AC
  Administered 2014-02-09: 500 mL via INTRAVENOUS

## 2014-02-09 NOTE — Discharge Instructions (Signed)
Follow up with your md next week for recheck °

## 2014-02-09 NOTE — ED Provider Notes (Signed)
CSN: [id number]     Arrival date & time 02/09/14  1432 History  This chart was scribed for Maudry Diego, MD by Einar Pheasant, ED Scribe. This patient was seen in room APA01/APA01 and the patient's care was started at 3:05 PM.    Chief Complaint  Patient presents with  . Altered Mental Status   Patient is a 80 y.o. male presenting with altered mental status and abdominal pain. The history is provided by the patient. No language interpreter was used.  Altered Mental Status Associated symptoms: no abdominal pain, no hallucinations, no headaches, no rash and no seizures   Abdominal Pain Pain location:  Generalized (generalized lower abdominal pain) Pain quality: aching   Pain radiates to:  Does not radiate Pain severity:  Mild Onset quality:  Gradual Duration: several days. Timing:  Unable to specify Progression:  Unchanged Chronicity:  New Relieved by:  Nothing Worsened by:  Nothing tried Associated symptoms: no chest pain, no cough, no diarrhea, no fatigue and no hematuria    HPI Comments: Dylan Nelson is a 80 y.o. male who presents to the Emergency Department complaining of persistent abdominal pain for a while. Dylan Nelson lives at a local nursing home and was brought here to be treated for a UTI as per triage nurse. He is also complaining of associated lower back pain. Pt denies any emesis, dysuria, fever, chills, nausea, or congestion.    Past Medical History  Diagnosis Date  . Hypertension   . Neuropathy   . Prostate enlargement   . DDD (degenerative disc disease), cervical   . CIDP (chronic inflammatory demyelinating polyneuropathy)   . Gait disorder   . Dysphagia   . NSTEMI (non-ST elevated myocardial infarction)   . Enlarged prostate   . Major depressive disorder   . Constipation    Past Surgical History  Procedure Laterality Date  . Hernia repair     Family History  Problem Relation Age of Onset  . Heart attack Father 33  . Cancer Mother 78   History   Substance Use Topics  . Smoking status: Former Research scientist (life sciences)  . Smokeless tobacco: Not on file  . Alcohol Use: No    Review of Systems  Constitutional: Negative for appetite change and fatigue.  HENT: Negative for congestion, ear discharge and sinus pressure.   Eyes: Negative for discharge.  Respiratory: Negative for cough.   Cardiovascular: Negative for chest pain.  Gastrointestinal: Negative for abdominal pain and diarrhea.  Genitourinary: Negative for frequency and hematuria.  Musculoskeletal: Negative for back pain.  Skin: Negative for rash.  Neurological: Negative for seizures and headaches.  Psychiatric/Behavioral: Negative for hallucinations.   Allergies  Review of patient's allergies indicates no known allergies.  Home Medications   Prior to Admission medications   Medication Sig Start Date End Date Taking? Authorizing Provider  Amino Acids-Protein Hydrolys (FEEDING SUPPLEMENT, PRO-STAT SUGAR FREE 64,) LIQD Take 30 mLs by mouth 2 (two) times daily.    Historical Provider, MD  aspirin 325 MG tablet Take 1 tablet (325 mg total) by mouth daily. 01/21/14   Alonza Bogus, MD  atorvastatin (LIPITOR) 40 MG tablet Take 1 tablet (40 mg total) by mouth daily at 6 PM. 01/21/14   Alonza Bogus, MD  bisacodyl (DULCOLAX) 10 MG suppository Place 10 mg rectally as needed for moderate constipation.    Historical Provider, MD  ENSURE (ENSURE) Take 237 mLs by mouth 2 (two) times daily.    Historical Provider, MD  finasteride (PROSCAR)  5 MG tablet Take 1 tablet by mouth daily. 08/27/13   Historical Provider, MD  heparin 5000 UNIT/ML injection Inject 1 mL (5,000 Units total) into the skin every 8 (eight) hours. 11/07/13   Alonza Bogus, MD  HYDROcodone-acetaminophen (NORCO/VICODIN) 5-325 MG per tablet Take 1 tablet by mouth every 4 (four) hours as needed for moderate pain. 11/07/13   Alonza Bogus, MD  lisinopril (PRINIVIL,ZESTRIL) 2.5 MG tablet Take 1 tablet (2.5 mg total) by mouth daily.  01/21/14   Alonza Bogus, MD  LORazepam (ATIVAN) 0.5 MG tablet Take 0.5 mg by mouth every 6 (six) hours as needed for anxiety.    Historical Provider, MD  metoprolol tartrate (LOPRESSOR) 12.5 mg TABS tablet Take 0.5 tablets (12.5 mg total) by mouth 2 (two) times daily. 01/21/14   Alonza Bogus, MD  omeprazole (PRILOSEC) 20 MG capsule Take 20 mg by mouth daily.    Historical Provider, MD  polyethylene glycol (MIRALAX / GLYCOLAX) packet Take 17 g by mouth daily.    Historical Provider, MD  potassium chloride SA (K-DUR,KLOR-CON) 20 MEQ tablet Take 40 mEq by mouth.    Historical Provider, MD  predniSONE (DELTASONE) 10 MG tablet Take 1 tablet (10 mg total) by mouth daily with breakfast. 11/07/13   Alonza Bogus, MD  senna-docusate (SENOKOT-S) 8.6-50 MG per tablet Take 1 tablet by mouth 2 (two) times daily.    Historical Provider, MD  sertraline (ZOLOFT) 25 MG tablet Take 25 mg by mouth daily.    Historical Provider, MD  torsemide (DEMADEX) 20 MG tablet Take 1 tablet by mouth daily. 08/27/13   Historical Provider, MD   BP 96/68  Pulse 90  Temp(Src) 97.6 F (36.4 C) (Oral)  Resp 20  SpO2 94%  Physical Exam  Constitutional: He is oriented to person, place, and time. He appears well-developed.  HENT:  Head: Normocephalic.  Mouth/Throat: Mucous membranes are dry.  Eyes: Conjunctivae and EOM are normal. No scleral icterus.  Neck: Neck supple. No thyromegaly present.  Cardiovascular: Normal rate and regular rhythm.  Exam reveals no gallop and no friction rub.   No murmur heard. Pulmonary/Chest: No stridor. He has no wheezes. He has no rales. He exhibits no tenderness.  Abdominal: He exhibits no distension. There is tenderness in the left upper quadrant and left lower quadrant. There is no rebound.  Musculoskeletal: Normal range of motion. He exhibits no edema.  Lymphadenopathy:    He has no cervical adenopathy.  Neurological: He is oriented to person, place, and time. He exhibits normal muscle  tone. Coordination normal.  Skin: No rash noted. No erythema.  Psychiatric: He has a normal mood and affect. His behavior is normal.    ED Course  Procedures (including critical care time)  DIAGNOSTIC STUDIES: Oxygen Saturation is 94% on RA, low by my interpretation.    COORDINATION OF CARE: 3:14 PM- Pt advised of plan for treatment and pt agrees.   Labs Review Labs Reviewed - No data to display  Imaging Review No results found.   EKG Interpretation None      MDM   Final diagnoses:  None    uti tx with keflex  I personally performed the services described in this documentation, which was scribed in my presence. The recorded information has been reviewed and is accurate.    Maudry Diego, MD 02/09/14 [phone]

## 2014-02-09 NOTE — ED Notes (Addendum)
Pt being treated for UTI. Pt received IV vancomycin,zosyn prior to arrival today. Blood cultures pending. Per NP, pt hallucinating during her assessment today. Pt brought in for further evaluation of increased confusion. Pt also scheduled for CT abdomen/pelvis on Thursday but NP wants CT to be completed today. Pt has indwelling foley at time of arrival.

## 2014-02-11 ENCOUNTER — Ambulatory Visit (HOSPITAL_COMMUNITY): Admission: RE | Admit: 2014-02-11 | Payer: Medicare Other | Source: Ambulatory Visit

## 2014-02-15 ENCOUNTER — Encounter: Payer: Self-pay | Admitting: *Deleted

## 2014-02-15 ENCOUNTER — Encounter: Payer: Medicare Other | Admitting: Cardiovascular Disease

## 2014-03-02 ENCOUNTER — Ambulatory Visit (INDEPENDENT_AMBULATORY_CARE_PROVIDER_SITE_OTHER): Payer: Medicare Other | Admitting: Urology

## 2014-03-02 DIAGNOSIS — N401 Enlarged prostate with lower urinary tract symptoms: Secondary | ICD-10-CM

## 2014-03-02 DIAGNOSIS — C61 Malignant neoplasm of prostate: Secondary | ICD-10-CM

## 2014-03-18 ENCOUNTER — Other Ambulatory Visit: Payer: Self-pay | Admitting: Urology

## 2014-03-18 DIAGNOSIS — N390 Urinary tract infection, site not specified: Secondary | ICD-10-CM

## 2014-03-24 ENCOUNTER — Ambulatory Visit (HOSPITAL_COMMUNITY)
Admission: RE | Admit: 2014-03-24 | Discharge: 2014-03-24 | Disposition: A | Discharge status: Home / Self Care | Payer: Medicare Other | Source: Ambulatory Visit | Attending: Urology | Admitting: Urology

## 2014-03-24 DIAGNOSIS — Z87891 Personal history of nicotine dependence: Secondary | ICD-10-CM | POA: Diagnosis not present

## 2014-03-24 DIAGNOSIS — N400 Enlarged prostate without lower urinary tract symptoms: Secondary | ICD-10-CM | POA: Insufficient documentation

## 2014-03-24 DIAGNOSIS — I10 Essential (primary) hypertension: Secondary | ICD-10-CM | POA: Insufficient documentation

## 2014-03-24 DIAGNOSIS — N390 Urinary tract infection, site not specified: Secondary | ICD-10-CM | POA: Diagnosis not present

## 2014-04-27 ENCOUNTER — Ambulatory Visit (INDEPENDENT_AMBULATORY_CARE_PROVIDER_SITE_OTHER): Payer: Medicare Other | Admitting: Urology

## 2014-04-27 DIAGNOSIS — N400 Enlarged prostate without lower urinary tract symptoms: Secondary | ICD-10-CM

## 2014-04-27 DIAGNOSIS — N61 Inflammatory disorders of breast: Secondary | ICD-10-CM

## 2014-04-27 DIAGNOSIS — N1330 Unspecified hydronephrosis: Secondary | ICD-10-CM

## 2014-05-17 ENCOUNTER — Encounter: Payer: Self-pay | Admitting: Vascular Surgery

## 2014-05-18 ENCOUNTER — Encounter: Payer: Medicare Other | Admitting: Vascular Surgery

## 2014-05-26 ENCOUNTER — Encounter: Payer: Self-pay | Admitting: Vascular Surgery

## 2014-05-27 ENCOUNTER — Encounter (HOSPITAL_COMMUNITY): Payer: Self-pay | Admitting: Vascular Surgery

## 2014-05-27 ENCOUNTER — Ambulatory Visit (INDEPENDENT_AMBULATORY_CARE_PROVIDER_SITE_OTHER): Payer: Medicare Other | Admitting: Vascular Surgery

## 2014-05-27 ENCOUNTER — Encounter: Payer: Self-pay | Admitting: Vascular Surgery

## 2014-05-27 ENCOUNTER — Encounter: Payer: Self-pay | Admitting: Family Medicine

## 2014-05-27 ENCOUNTER — Inpatient Hospital Stay (HOSPITAL_COMMUNITY)
Admission: EM | Admit: 2014-05-27 | Discharge: 2014-05-31 | DRG: 603 | Disposition: A | Discharge status: Skilled Nursing Facility | Payer: Medicare Other | Attending: Internal Medicine | Admitting: Internal Medicine

## 2014-05-27 ENCOUNTER — Emergency Department (HOSPITAL_COMMUNITY): Payer: Medicare Other

## 2014-05-27 VITALS — BP 103/72 | HR 109 | Resp 18 | Ht 73.0 in | Wt 200.0 lb

## 2014-05-27 DIAGNOSIS — I5189 Other ill-defined heart diseases: Secondary | ICD-10-CM | POA: Insufficient documentation

## 2014-05-27 DIAGNOSIS — I252 Old myocardial infarction: Secondary | ICD-10-CM

## 2014-05-27 DIAGNOSIS — N390 Urinary tract infection, site not specified: Secondary | ICD-10-CM | POA: Diagnosis present

## 2014-05-27 DIAGNOSIS — L89152 Pressure ulcer of sacral region, stage 2: Secondary | ICD-10-CM | POA: Diagnosis present

## 2014-05-27 DIAGNOSIS — Z87891 Personal history of nicotine dependence: Secondary | ICD-10-CM

## 2014-05-27 DIAGNOSIS — G629 Polyneuropathy, unspecified: Secondary | ICD-10-CM

## 2014-05-27 DIAGNOSIS — F329 Major depressive disorder, single episode, unspecified: Secondary | ICD-10-CM | POA: Diagnosis present

## 2014-05-27 DIAGNOSIS — I2510 Atherosclerotic heart disease of native coronary artery without angina pectoris: Secondary | ICD-10-CM | POA: Diagnosis present

## 2014-05-27 DIAGNOSIS — M869 Osteomyelitis, unspecified: Secondary | ICD-10-CM | POA: Diagnosis present

## 2014-05-27 DIAGNOSIS — L97519 Non-pressure chronic ulcer of other part of right foot with unspecified severity: Secondary | ICD-10-CM | POA: Diagnosis present

## 2014-05-27 DIAGNOSIS — L03031 Cellulitis of right toe: Secondary | ICD-10-CM | POA: Diagnosis not present

## 2014-05-27 DIAGNOSIS — I70299 Other atherosclerosis of native arteries of extremities, unspecified extremity: Secondary | ICD-10-CM

## 2014-05-27 DIAGNOSIS — S91109A Unspecified open wound of unspecified toe(s) without damage to nail, initial encounter: Secondary | ICD-10-CM

## 2014-05-27 DIAGNOSIS — L89159 Pressure ulcer of sacral region, unspecified stage: Secondary | ICD-10-CM | POA: Diagnosis present

## 2014-05-27 DIAGNOSIS — Z7982 Long term (current) use of aspirin: Secondary | ICD-10-CM

## 2014-05-27 DIAGNOSIS — K5900 Constipation, unspecified: Secondary | ICD-10-CM | POA: Diagnosis present

## 2014-05-27 DIAGNOSIS — I10 Essential (primary) hypertension: Secondary | ICD-10-CM | POA: Diagnosis present

## 2014-05-27 DIAGNOSIS — G6181 Chronic inflammatory demyelinating polyneuritis: Secondary | ICD-10-CM | POA: Diagnosis present

## 2014-05-27 DIAGNOSIS — N400 Enlarged prostate without lower urinary tract symptoms: Secondary | ICD-10-CM | POA: Diagnosis present

## 2014-05-27 DIAGNOSIS — M79673 Pain in unspecified foot: Secondary | ICD-10-CM | POA: Diagnosis not present

## 2014-05-27 DIAGNOSIS — L97909 Non-pressure chronic ulcer of unspecified part of unspecified lower leg with unspecified severity: Secondary | ICD-10-CM

## 2014-05-27 DIAGNOSIS — Z993 Dependence on wheelchair: Secondary | ICD-10-CM

## 2014-05-27 LAB — SEDIMENTATION RATE: Sed Rate: 21 mm/hr — ABNORMAL HIGH (ref 0–16)

## 2014-05-27 LAB — CBC WITH DIFFERENTIAL/PLATELET
Basophils Absolute: 0 10*3/uL (ref 0.0–0.1)
Basophils Relative: 0 % (ref 0–1)
EOS ABS: 0.1 10*3/uL (ref 0.0–0.7)
EOS PCT: 1 % (ref 0–5)
HCT: 40.6 % (ref 39.0–52.0)
Hemoglobin: 12.9 g/dL — ABNORMAL LOW (ref 13.0–17.0)
LYMPHS ABS: 2.1 10*3/uL (ref 0.7–4.0)
Lymphocytes Relative: 13 % (ref 12–46)
MCH: 27.4 pg (ref 26.0–34.0)
MCHC: 31.8 g/dL (ref 30.0–36.0)
MCV: 86.2 fL (ref 78.0–100.0)
Monocytes Absolute: 0.6 10*3/uL (ref 0.1–1.0)
Monocytes Relative: 4 % (ref 3–12)
Neutro Abs: 13.6 10*3/uL — ABNORMAL HIGH (ref 1.7–7.7)
Neutrophils Relative %: 82 % — ABNORMAL HIGH (ref 43–77)
PLATELETS: 321 10*3/uL (ref 150–400)
RBC: 4.71 MIL/uL (ref 4.22–5.81)
RDW: 14.6 % (ref 11.5–15.5)
WBC: 16.4 10*3/uL — ABNORMAL HIGH (ref 4.0–10.5)

## 2014-05-27 LAB — C-REACTIVE PROTEIN: CRP: 0.6 mg/dL — AB (ref <0.60)

## 2014-05-27 LAB — COMPREHENSIVE METABOLIC PANEL
ALT: 50 U/L (ref 0–53)
ANION GAP: 18 — AB (ref 5–15)
AST: 28 U/L (ref 0–37)
Albumin: 3.3 g/dL — ABNORMAL LOW (ref 3.5–5.2)
Alkaline Phosphatase: 82 U/L (ref 39–117)
BUN: 26 mg/dL — AB (ref 6–23)
CO2: 22 mEq/L (ref 19–32)
CREATININE: 0.58 mg/dL (ref 0.50–1.35)
Calcium: 9.5 mg/dL (ref 8.4–10.5)
Chloride: 97 mEq/L (ref 96–112)
GFR calc non Af Amer: >90 mL/min (ref >90)
GLUCOSE: 171 mg/dL — AB (ref 70–99)
Potassium: 4.1 mEq/L (ref 3.7–5.3)
SODIUM: 137 meq/L (ref 137–147)
TOTAL PROTEIN: 6.9 g/dL (ref 6.0–8.3)
Total Bilirubin: 0.3 mg/dL (ref 0.3–1.2)

## 2014-05-27 MED ORDER — HEPARIN SODIUM (PORCINE) 5000 UNIT/ML IJ SOLN
5000.0000 [IU] | Freq: Three times a day (TID) | INTRAMUSCULAR | Status: DC
  Administered 2014-05-27 – 2014-05-31 (×12): 5000 [IU] via SUBCUTANEOUS
  Filled 2014-05-27 (×14): qty 1

## 2014-05-27 MED ORDER — ASPIRIN 325 MG PO TABS
325.0000 mg | ORAL_TABLET | Freq: Every day | ORAL | Status: DC
  Administered 2014-05-28 – 2014-05-31 (×4): 325 mg via ORAL
  Filled 2014-05-27 (×4): qty 1

## 2014-05-27 MED ORDER — POLYETHYLENE GLYCOL 3350 17 G PO PACK
17.0000 g | PACK | Freq: Every day | ORAL | Status: DC
  Administered 2014-05-28 – 2014-05-30 (×3): 17 g via ORAL
  Filled 2014-05-27 (×4): qty 1

## 2014-05-27 MED ORDER — SODIUM CHLORIDE 0.9 % IV BOLUS (SEPSIS)
500.0000 mL | Freq: Once | INTRAVENOUS | Status: AC
  Administered 2014-05-27: 500 mL via INTRAVENOUS

## 2014-05-27 MED ORDER — TORSEMIDE 20 MG PO TABS
20.0000 mg | ORAL_TABLET | Freq: Every day | ORAL | Status: DC
  Administered 2014-05-28 – 2014-05-31 (×4): 20 mg via ORAL
  Filled 2014-05-27 (×4): qty 1

## 2014-05-27 MED ORDER — BISACODYL 10 MG RE SUPP: 10.0000 mg | RECTAL | Status: DC | PRN

## 2014-05-27 MED ORDER — CLOPIDOGREL BISULFATE 75 MG PO TABS
75.0000 mg | ORAL_TABLET | Freq: Every day | ORAL | Status: DC
  Administered 2014-05-28 – 2014-05-31 (×4): 75 mg via ORAL
  Filled 2014-05-27 (×4): qty 1

## 2014-05-27 MED ORDER — SERTRALINE HCL 25 MG PO TABS
25.0000 mg | ORAL_TABLET | Freq: Every day | ORAL | Status: DC
  Administered 2014-05-28 – 2014-05-31 (×4): 25 mg via ORAL
  Filled 2014-05-27 (×5): qty 1

## 2014-05-27 MED ORDER — VANCOMYCIN HCL IN DEXTROSE 1-5 GM/200ML-% IV SOLN
1000.0000 mg | Freq: Three times a day (TID) | INTRAVENOUS | Status: DC
  Administered 2014-05-27 – 2014-05-30 (×10): 1000 mg via INTRAVENOUS
  Filled 2014-05-27 (×12): qty 200

## 2014-05-27 MED ORDER — SODIUM CHLORIDE 0.9 % IV SOLN
INTRAVENOUS | Status: DC
  Administered 2014-05-27 – 2014-05-30 (×2): via INTRAVENOUS

## 2014-05-27 MED ORDER — ENSURE COMPLETE PO LIQD
237.0000 mL | Freq: Two times a day (BID) | ORAL | Status: DC
  Administered 2014-05-28 – 2014-05-31 (×8): 237 mL via ORAL

## 2014-05-27 MED ORDER — METOPROLOL TARTRATE 12.5 MG HALF TABLET
12.5000 mg | ORAL_TABLET | Freq: Two times a day (BID) | ORAL | Status: DC
  Administered 2014-05-28 – 2014-05-31 (×7): 12.5 mg via ORAL
  Filled 2014-05-27 (×8): qty 1

## 2014-05-27 MED ORDER — PIPERACILLIN-TAZOBACTAM 3.375 G IVPB 30 MIN
3.3750 g | Freq: Once | INTRAVENOUS | Status: AC
  Administered 2014-05-27: 3.375 g via INTRAVENOUS
  Filled 2014-05-27: qty 50

## 2014-05-27 MED ORDER — PANTOPRAZOLE SODIUM 40 MG PO TBEC
40.0000 mg | DELAYED_RELEASE_TABLET | Freq: Every day | ORAL | Status: DC
  Administered 2014-05-28 – 2014-05-31 (×4): 40 mg via ORAL
  Filled 2014-05-27 (×3): qty 1

## 2014-05-27 MED ORDER — LORAZEPAM 0.5 MG PO TABS: 0.5000 mg | ORAL_TABLET | Freq: Four times a day (QID) | ORAL | Status: DC | PRN

## 2014-05-27 MED ORDER — ACETAMINOPHEN 325 MG PO TABS: 650.0000 mg | ORAL_TABLET | Freq: Four times a day (QID) | ORAL | Status: DC | PRN

## 2014-05-27 MED ORDER — SODIUM CHLORIDE 0.9 % IJ SOLN
3.0000 mL | Freq: Two times a day (BID) | INTRAMUSCULAR | Status: DC
  Administered 2014-05-28 – 2014-05-30 (×5): 3 mL via INTRAVENOUS

## 2014-05-27 MED ORDER — VANCOMYCIN HCL 10 G IV SOLR
1500.0000 mg | INTRAVENOUS | Status: DC
  Filled 2014-05-27: qty 1500

## 2014-05-27 MED ORDER — PIPERACILLIN-TAZOBACTAM 3.375 G IVPB
3.3750 g | Freq: Three times a day (TID) | INTRAVENOUS | Status: DC
  Administered 2014-05-28 – 2014-05-31 (×10): 3.375 g via INTRAVENOUS
  Filled 2014-05-27 (×13): qty 50

## 2014-05-27 MED ORDER — ATORVASTATIN CALCIUM 40 MG PO TABS
40.0000 mg | ORAL_TABLET | Freq: Every day | ORAL | Status: DC
  Administered 2014-05-28 – 2014-05-30 (×3): 40 mg via ORAL
  Filled 2014-05-27 (×4): qty 1

## 2014-05-27 MED ORDER — DEXTROSE 5 % IV SOLN
2.0000 g | Freq: Once | INTRAVENOUS | Status: AC
  Administered 2014-05-27: 2 g via INTRAVENOUS
  Filled 2014-05-27: qty 2

## 2014-05-27 MED ORDER — LISINOPRIL 2.5 MG PO TABS
2.5000 mg | ORAL_TABLET | Freq: Every day | ORAL | Status: DC
  Administered 2014-05-29 – 2014-05-31 (×2): 2.5 mg via ORAL
  Filled 2014-05-27 (×4): qty 1

## 2014-05-27 MED ORDER — FINASTERIDE 5 MG PO TABS
5.0000 mg | ORAL_TABLET | Freq: Every day | ORAL | Status: DC
  Administered 2014-05-28 – 2014-05-31 (×4): 5 mg via ORAL
  Filled 2014-05-27 (×4): qty 1

## 2014-05-27 MED ORDER — TRIAMCINOLONE ACETONIDE 0.1 % EX CREA
1.0000 "application " | TOPICAL_CREAM | Freq: Two times a day (BID) | CUTANEOUS | Status: DC
  Administered 2014-05-28 (×2): 1 via TOPICAL
  Filled 2014-05-27: qty 15

## 2014-05-27 MED ORDER — ZINC OXIDE 20 % EX OINT
1.0000 "application " | TOPICAL_OINTMENT | Freq: Three times a day (TID) | CUTANEOUS | Status: DC
  Administered 2014-05-28 (×2): 1 via TOPICAL
  Filled 2014-05-27: qty 28.35

## 2014-05-27 MED ORDER — POTASSIUM CHLORIDE CRYS ER 20 MEQ PO TBCR
20.0000 meq | EXTENDED_RELEASE_TABLET | Freq: Every day | ORAL | Status: DC
  Administered 2014-05-28 – 2014-05-31 (×4): 20 meq via ORAL
  Filled 2014-05-27 (×4): qty 1

## 2014-05-27 NOTE — H&P (Addendum)
Hospitalist Admission History and Physical  Patient name: Dylan Nelson Medical record number: [id number] Date of birth: 03/23/1934 Age: 80 y.o. Gender: male  Primary Care Provider: Alonza Bogus, Nelson  Chief Complaint: R great toe osteomyelitis   History of Present Illness:This is a 80 y.o. year old male with significant past medical history of HTN, CAD s/p NSTEMI, Grade 1 diastolic dysfunction,  CIDP  presenting with R foot osteomyelitis. Pt resident of local SNF. Pt has had >2-3 months of R great toe erythema, swelling, pain, drainage. Has been managed by SNF wound care. Still with worsening sxs. Was seen by Dylan Nelson w/ vascular surgery today at request of family. Had palpable/adequate distal blood flow. Recommending inpt admission w/ eval by ortho.  On presentation, afebrile, HR 90s-110s, resp 10s, BP 70s-130s/50s-100s-mainly in 110s. Satting 94% on RA. WBC 16.4, hgb 12.9, Cr 0.58. R foot xray WNL. ESR 21. Started on vanc and rocephin by EDP. EDP Dylan Nelson) spoke with Dylan Nelson w/ [facility] who is recommending MRI R foot. They will consult in am.   Assessment and Plan: Dylan Nelson is a 80 y.o. year old male presenting with R great toe osteomyelitis   Active Problems:   Osteomyelitis  1- R great toe osteomyelitis  -IV vanc and zosyn -blood cultures -MRI R foot  -f/u ortho recs   2- CAD s/p MI  -Had NSTEMI 01/2014 -currently on ASA and plavix  -baseline grade 1 diastolic dysfunction on ECHO 10/2013  -no active CP  -cont regimen  -tele bed  -may need cards clearance if surgery needed.   3- HTN/grade 1 diastolic dysfunction -BP stable  -euvolemic on exam  -cont home regimen    FEN/GI: heart healthy diet  Prophylaxis: sub q heparin  Disposition: pending further evaluation  Code Status:Full Code    Patient Active Problem List   Diagnosis Date Noted  . Atherosclerosis of artery of extremity with ulceration 05/27/2014  . Osteomyelitis 05/27/2014  . Elevated troponin  01/19/2014  . Chest pain 01/18/2014  . Scrotal abscess 11/07/2013  . CIDP (chronic inflammatory demyelinating polyneuropathy) 11/05/2013  . Bilateral leg edema 11/02/2013  . HTN (hypertension) 11/02/2013  . Neuropathy 11/02/2013  . Muscle weakness (generalized) 09/07/2013  . Unable to walk 09/07/2013   Past Medical History: Past Medical History  Diagnosis Date  . Hypertension   . Neuropathy   . Prostate enlargement   . DDD (degenerative disc disease), cervical   . CIDP (chronic inflammatory demyelinating polyneuropathy)   . Gait disorder   . Dysphagia   . NSTEMI (non-ST elevated myocardial infarction)   . Enlarged prostate   . Major depressive disorder   . Constipation     Past Surgical History: Past Surgical History  Procedure Laterality Date  . Hernia repair      Social History: History   Social History  . Marital Status: Married    Spouse Name: N/A    Number of Children: N/A  . Years of Education: N/A   Social History Main Topics  . Smoking status: Former Research scientist (life sciences)  . Smokeless tobacco: None  . Alcohol Use: No  . Drug Use: No  . Sexual Activity: No   Other Topics Concern  . None   Social History Narrative    Family History: Family History  Problem Relation Age of Onset  . Heart attack Father 58  . Cancer Mother 91    Allergies: No Known Allergies  Current Facility-Administered Medications  Medication Dose Route Frequency Provider Last Rate Last  Dose  . 0.9 %  sodium chloride infusion   Intravenous Continuous Dylan Nelson      . acetaminophen (TYLENOL) tablet 650 mg  650 mg Oral Q6H PRN Dylan Nelson      . aspirin tablet 325 mg  325 mg Oral Daily Dylan Nelson      . Derrill Memo ON 05/28/2014] atorvastatin (LIPITOR) tablet 40 mg  40 mg Oral q1800 Dylan Nelson      . bisacodyl (DULCOLAX) suppository 10 mg  10 mg Rectal PRN Dylan Nelson      . cefTRIAXone (ROCEPHIN) 2 g in dextrose 5 % 50 mL IVPB  2 g Intravenous Once Dylan Pert, Nelson 100 mL/hr at 05/27/14 2053 2 g at 05/27/14 2053  . clopidogrel (PLAVIX) tablet 75 mg  75 mg Oral Daily Dylan Nelson      . ENSURE (ENSURE) liquid 237 mL  237 mL Oral BID Dylan Nelson      . finasteride (PROSCAR) tablet 5 mg  5 mg Oral Daily Dylan Nelson      . heparin injection 5,000 Units  5,000 Units Subcutaneous 3 times per day Dylan Nelson      . lisinopril (PRINIVIL,ZESTRIL) tablet 2.5 mg  2.5 mg Oral Daily Dylan Nelson      . LORazepam (ATIVAN) tablet 0.5 mg  0.5 mg Oral Q6H PRN Dylan Nelson      . metoprolol tartrate (LOPRESSOR) tablet 12.5 mg  12.5 mg Oral BID Dylan Nelson      . pantoprazole (PROTONIX) EC tablet 40 mg  40 mg Oral Daily Dylan Nelson      . polyethylene glycol (MIRALAX / GLYCOLAX) packet 17 g  17 g Oral Daily Dylan Nelson      . potassium chloride SA (K-DUR,KLOR-CON) CR tablet 20 mEq  20 mEq Oral Daily Dylan Nelson      . sertraline (ZOLOFT) tablet 25 mg  25 mg Oral Daily Dylan Nelson      . sodium chloride 0.9 % injection 3 mL  3 mL Intravenous Q12H Dylan Nelson      . torsemide (DEMADEX) tablet 20 mg  20 mg Oral Daily Dylan Nelson      . triamcinolone cream (KENALOG) 0.1 % 1 application  1 application Topical BID Dylan Nelson      . vancomycin (VANCOCIN) 1,500 mg in sodium chloride 0.9 % 500 mL IVPB  1,500 mg Intravenous STAT Dylan Pert, Nelson      . ZINC OXIDE (TOPICAL) 10 % CREA 1 application  1 application Apply externally TID Dylan Nelson       Current Outpatient Prescriptions  Medication Sig Dispense Refill  . acetaminophen (TYLENOL) 325 MG tablet Take 650 mg by mouth every 6 (six) hours as needed for mild pain or fever.    Marland Kitchen acidophilus (RISAQUAD) CAPS capsule Take 1 capsule by mouth daily.    . Amino Acids-Protein Hydrolys (FEEDING SUPPLEMENT, PRO-STAT SUGAR FREE 64,) LIQD Take 30 mLs by mouth 2 (two) times daily.    Marland Kitchen aspirin 325 MG tablet Take 1 tablet (325 mg total) by  mouth daily.    Marland Kitchen atorvastatin (LIPITOR) 40 MG tablet Take 1 tablet (40 mg total) by mouth daily at 6 PM.    . ciprofloxacin (CIPRO) 500 MG tablet Take 500 mg by mouth 2 (two) times daily. Started 8/14 for 10 days for UTI    . clopidogrel (  PLAVIX) 75 MG tablet Take 75 mg by mouth daily.    Marland Kitchen ENSURE (ENSURE) Take 237 mLs by mouth 2 (two) times daily.    . finasteride (PROSCAR) 5 MG tablet Take 1 tablet by mouth daily.    Marland Kitchen lisinopril (PRINIVIL,ZESTRIL) 2.5 MG tablet Take 1 tablet (2.5 mg total) by mouth daily.    . metoprolol tartrate (LOPRESSOR) 25 MG tablet Take 12.5 mg by mouth 2 (two) times daily.    Marland Kitchen omeprazole (PRILOSEC) 20 MG capsule Take 20 mg by mouth daily.    . polyethylene glycol (MIRALAX / GLYCOLAX) packet Take 17 g by mouth daily.    . potassium chloride SA (K-DUR,KLOR-CON) 20 MEQ tablet Take 20 mEq by mouth daily.     . predniSONE (DELTASONE) 10 MG tablet Take 1 tablet (10 mg total) by mouth daily with breakfast.    . senna-docusate (SENOKOT-S) 8.6-50 MG per tablet Take 1 tablet by mouth 2 (two) times daily.    . sertraline (ZOLOFT) 50 MG tablet Take 25 mg by mouth daily.    Marland Kitchen torsemide (DEMADEX) 20 MG tablet Take 1 tablet by mouth daily.    Marland Kitchen triamcinolone cream (KENALOG) 0.1 % Apply 1 application topically 2 (two) times daily.    Marland Kitchen ZINC OXIDE, TOPICAL, 10 % CREA Apply 1 application topically 3 (three) times daily. Apply to right buttock topically every shift for sheared area on right buttock until healed.    . bisacodyl (DULCOLAX) 10 MG suppository Place 10 mg rectally as needed for moderate constipation.    . cephALEXin (KEFLEX) 500 MG capsule Take 1 capsule (500 mg total) by mouth 4 (four) times daily. (Patient not taking: Reported on 05/27/2014) 28 capsule 0  . cephALEXin (KEFLEX) 500 MG capsule Take 1 capsule (500 mg total) by mouth 4 (four) times daily. (Patient not taking: Reported on 05/27/2014) 28 capsule 0  . heparin 5000 UNIT/ML injection Inject 1 mL (5,000 Units total)  into the skin every 8 (eight) hours. (Patient not taking: Reported on 05/27/2014) 1 mL   . HYDROcodone-acetaminophen (NORCO/VICODIN) 5-325 MG per tablet Take 1 tablet by mouth every 4 (four) hours as needed for moderate pain. 30 tablet 0  . LORazepam (ATIVAN) 0.5 MG tablet Take 0.5 mg by mouth every 6 (six) hours as needed for anxiety.    . metoprolol tartrate (LOPRESSOR) 12.5 mg TABS tablet Take 0.5 tablets (12.5 mg total) by mouth 2 (two) times daily. (Patient not taking: Reported on 05/27/2014)     Review Of Systems: 12 point ROS negative except as noted above in HPI.  Physical Exam: Filed Vitals:   05/27/14 2100  BP: 107/80  Pulse: 99  Temp:   Resp: 15    General: alert and cooperative HEENT: PERRLA and extra ocular movement intact Heart: S1, S2 normal, no murmur, rub or gallop, regular rate and rhythm Lungs: clear to auscultation, no wheezes or rales and unlabored breathing Abdomen: abdomen is soft without significant tenderness, masses, organomegaly or guarding Extremities: 2+ peripheral pulses distally    Skin:as above  Neurology: distal polyneuropathy-at baseline, otherwise grossly normal exam   Labs and Imaging: Lab Results  Component Value Date/Time   NA 137 05/27/2014 03:48 PM   K 4.1 05/27/2014 03:48 PM   CL 97 05/27/2014 03:48 PM   CO2 22 05/27/2014 03:48 PM   BUN 26* 05/27/2014 03:48 PM   CREATININE 0.58 05/27/2014 03:48 PM   GLUCOSE 171* 05/27/2014 03:48 PM   Lab Results  Component Value Date  WBC 16.4* 05/27/2014   HGB 12.9* 05/27/2014   HCT 40.6 05/27/2014   MCV 86.2 05/27/2014   PLT 321 05/27/2014    Dg Foot Complete Right  05/27/2014   CLINICAL DATA:  Ulcer on the distal right great toe and heel. Concern for possible osteomyelitis. Wounds have been present for 2 months. Numbness in the foot. No diabetes.  EXAM: RIGHT FOOT COMPLETE - 3+ VIEW  COMPARISON:  09/05/2006  FINDINGS: There is no evidence of fracture or dislocation. Small plantar and  Achilles spurs are present. Soft tissues are unremarkable.  IMPRESSION: No evidence for acute  abnormality.   Electronically Signed   By: Shon Hale M.D.   On: 05/27/2014 19:55           Dylan Howells Nelson  Pager: [phone]

## 2014-05-27 NOTE — Progress Notes (Signed)
Referred by:  Alonza Bogus, MD [place], [place] [address]  Reason for referral: right great toe wound  History of Present Illness  Dylan Nelson is a 80 y.o. ([birth year]) male who presents with chief complaint: non-healing right great toe.  Onset of symptom occurred ~June 2015, pt developed a pinpoint wound on right great toe related to lack of mobility due to loss of bilateral lower leg strength, etiology unknown at this point.  Over the last month, he has developed progressive worsening of his wound on his right great toe despite wound care at his nursing home and PO antibiotics.  Pain is described as sharp and painful, severity 3-6/10, and associated with manipulating the right great toe.  Patient has attempted to treat this pain with wound care, abx, and pain meds.  The patient has never had intermittent claudication or rest pain symptoms.  Podiatry has previously seen this patient and decline further evaluation until his vascular status was clarified.   Atherosclerotic risk factors include: HTN, HLD.  Past Medical History  Diagnosis Date  . Hypertension   . Neuropathy   . Prostate enlargement   . DDD (degenerative disc disease), cervical   . CIDP (chronic inflammatory demyelinating polyneuropathy)   . Gait disorder   . Dysphagia   . NSTEMI (non-ST elevated myocardial infarction)   . Enlarged prostate   . Major depressive disorder   . Constipation     Past Surgical History  Procedure Laterality Date  . Hernia repair      History   Social History  . Marital Status: Married    Spouse Name: N/A    Number of Children: N/A  . Years of Education: N/A   Occupational History  . Not on file.   Social History Main Topics  . Smoking status: Former Research scientist (life sciences)  . Smokeless tobacco: Not on file  . Alcohol Use: No  . Drug Use: No  . Sexual Activity: No   Other Topics Concern  . Not on file   Social History Narrative    Family History    Problem Relation Age of Onset  . Heart attack Father 62  . Cancer Mother 26    Current Outpatient Prescriptions on File Prior to Visit  Medication Sig Dispense Refill  . Amino Acids-Protein Hydrolys (FEEDING SUPPLEMENT, PRO-STAT SUGAR FREE 64,) LIQD Take 30 mLs by mouth 2 (two) times daily.    Marland Kitchen aspirin 325 MG tablet Take 1 tablet (325 mg total) by mouth daily.    Marland Kitchen atorvastatin (LIPITOR) 40 MG tablet Take 1 tablet (40 mg total) by mouth daily at 6 PM.    . bisacodyl (DULCOLAX) 10 MG suppository Place 10 mg rectally as needed for moderate constipation.    . cephALEXin (KEFLEX) 500 MG capsule Take 1 capsule (500 mg total) by mouth 4 (four) times daily. 28 capsule 0  . cephALEXin (KEFLEX) 500 MG capsule Take 1 capsule (500 mg total) by mouth 4 (four) times daily. 28 capsule 0  . ciprofloxacin (CIPRO) 500 MG tablet Take 500 mg by mouth 2 (two) times daily. Started 8/14 for 10 days for UTI    . ENSURE (ENSURE) Take 237 mLs by mouth 2 (two) times daily.    . finasteride (PROSCAR) 5 MG tablet Take 1 tablet by mouth daily.    . heparin 5000 UNIT/ML injection Inject 1 mL (5,000 Units total) into the skin every 8 (eight) hours. 1 mL   . HYDROcodone-acetaminophen (NORCO/VICODIN)  5-325 MG per tablet Take 1 tablet by mouth every 4 (four) hours as needed for moderate pain. 30 tablet 0  . lisinopril (PRINIVIL,ZESTRIL) 2.5 MG tablet Take 1 tablet (2.5 mg total) by mouth daily.    Marland Kitchen LORazepam (ATIVAN) 0.5 MG tablet Take 0.5 mg by mouth every 6 (six) hours as needed for anxiety.    . metoprolol tartrate (LOPRESSOR) 12.5 mg TABS tablet Take 0.5 tablets (12.5 mg total) by mouth 2 (two) times daily.    Marland Kitchen omeprazole (PRILOSEC) 20 MG capsule Take 20 mg by mouth daily.    . piperacillin-tazobactam (ZOSYN) 3.375 GM/50ML IVPB Inject 3.375 g into the vein every 6 (six) hours. For 7 days started on 02/08/2014    . polyethylene glycol (MIRALAX / GLYCOLAX) packet Take 17 g by mouth daily.    . potassium chloride SA  (K-DUR,KLOR-CON) 20 MEQ tablet Take 40 mEq by mouth daily.     . predniSONE (DELTASONE) 10 MG tablet Take 1 tablet (10 mg total) by mouth daily with breakfast.    . senna-docusate (SENOKOT-S) 8.6-50 MG per tablet Take 1 tablet by mouth 2 (two) times daily.    . sertraline (ZOLOFT) 50 MG tablet Take 25 mg by mouth daily.    Marland Kitchen torsemide (DEMADEX) 20 MG tablet Take 1 tablet by mouth daily.    . Vancomycin HCl in Dextrose (VANCOCIN HCL IV) Inject 1,250 mg into the vein 2 (two) times daily.     No current facility-administered medications on file prior to visit.    No Known Allergies   REVIEW OF SYSTEMS:  (Positives checked otherwise negative)  CARDIOVASCULAR:  []  chest pain, []  chest pressure, []  palpitations, []  shortness of breath when laying flat, []  shortness of breath with exertion,  []  pain in feet when walking, [x]  pain in feet when laying flat, []  history of blood clot in veins (DVT), []  history of phlebitis, []  swelling in legs, []  varicose veins  PULMONARY:  []  productive cough, []  asthma, []  wheezing  NEUROLOGIC:  [x]  weakness in arms or legs, []  numbness in arms or legs, []  difficulty speaking or slurred speech, []  temporary loss of vision in one eye, []  dizziness  HEMATOLOGIC:  []  bleeding problems, []  problems with blood clotting too easily  MUSCULOSKEL:  []  joint pain, []  joint swelling  GASTROINTEST:  []  vomiting blood, []  blood in stool     GENITOURINARY:  []  burning with urination, []  blood in urine  PSYCHIATRIC:  []  history of major depression  INTEGUMENTARY:  []  rashes, [x]  ulcers  CONSTITUTIONAL:  []  fever, []  chills  For VQI Use Only    PRE-ADM LIVING: Home  AMB STATUS: Ambulatory  CAD Sx: History of MI, but no symptoms MI < 6 months ago  PRIOR CHF: None  STRESS TEST: [ ]  No, [ ]  Normal, [ ]  + ischemia, [ ]  + MI, [x]  Both   Physical Examination  Filed Vitals:   05/27/14 1418  BP: 103/72  Pulse: 109  Resp: 18  Height: 6\' 1"  (1.854 m)  Weight:  200 lb (90.719 kg)   Body mass index is 26.39 kg/(m^2).  General: A&O x 3, WD, ill appearing, wheelchair bound  Head: [place]/AT  Ear/Nose/Throat: Hearing grossly intact, nares w/o erythema or drainage, oropharynx w/o Erythema/Exudate, Mallampati score: 3  Eyes: PERRLA, EOMI  Neck: Supple, no nuchal rigidity, no palpable LAD  Pulmonary: Sym exp, good air movt, CTAB, no rales, rhonchi, & wheezing  Cardiac: RRR, Nl S1, S2, no Murmurs, rubs or  gallops  Vascular: Vessel Right Left  Radial Palpable Palpable  Ulnar Not Palpable NotPalpable  Brachial Palpable Palpable  Carotid Palpable, without bruit Palpable, without bruit  Aorta Not palpable N/A  Femoral Palpable Palpable  Popliteal Not palpable Not palpable  PT Not Palpable Not Palpable  DP Faintly Palpable FaintlyPalpable   Gastrointestinal: soft, NTND, -G/R, - HSM, - masses, - CVAT B  Musculoskeletal: M/S 5/5 throughout except 1/5 B lower legs, Extremities without ischemic changes , B foot cyanosis, R great toe: multiple ulcers with evidence of some frank purulence draining, ischemic appearing skin over the tip of toe, some edema in great toe, dopplerable DP (biphasic), distal arch (adjacent to toe, monophasic)  Neurologic: CN 2-12 intact , Pain and light touch intact in extremities , Motor exam as listed above  Psychiatric: Judgment intact, Mood & affect appropriate for pt's clinical situation  Dermatologic: See M/S exam for extremity exam, no rashes otherwise noted  Lymph : No Cervical, Axillary, or Inguinal lymphadenopathy   Non-Invasive Vascular Imaging  Outside BLE arterial (04/27/14)  R: biphasic through pop, monophasic distally, ABI 0.88  L: biphasic throughout, ABI 0.94  Outside Studies/Documentation 10 pages of outside documents were reviewed including: outpatient nursing home medical record, outside arterial study.  Medical Decision Making  Dylan Nelson is a 80 y.o. male who presents with: mild-moderate BLE  PAD, infected right great toe concerning for osteomyelitis, BLE chronic venous insufficiency    Pt will has already undergone conservative mgmt with outpatient wound care and PO abx without improvement  I recommend more aggressive mgmt to try to salvage his R great toe/foot.  I recommend: 1. Admission to Hospitalist service for IV abx and medical optimization given recent MI history 2. R foot films +/- MRI as indicated by films 3. Evaluation by Dr. Sharol Given (Ortho)   I doubt that immediate arterial reconstruction is necessary with a palpable DP and intact dorsal arch flow,  Thank you for allowing [place] to participate in this patient's care.  Adele Barthel, MD [facility] Office: [phone] Pager: [phone]  05/27/2014, 3:04 PM

## 2014-05-27 NOTE — ED Provider Notes (Signed)
CSN: [id number]     Arrival date & time 05/27/14  1540 History   First MD Initiated Contact with Patient 05/27/14 1706     Chief Complaint  Patient presents with  . Toe Injury     (Consider location/radiation/quality/duration/timing/severity/associated sxs/prior Treatment) Patient is a 80 y.o. male presenting with lower extremity pain. The history is provided by the patient.  Foot Pain This is a chronic problem. The current episode started more than 1 week ago. The problem occurs constantly. The problem has not changed since onset.Pertinent negatives include no chest pain, no abdominal pain, no headaches and no shortness of breath. Nothing aggravates the symptoms. Nothing relieves the symptoms. He has tried nothing for the symptoms. The treatment provided no relief.    Past Medical History  Diagnosis Date  . Hypertension   . Neuropathy   . Prostate enlargement   . DDD (degenerative disc disease), cervical   . CIDP (chronic inflammatory demyelinating polyneuropathy)   . Gait disorder   . Dysphagia   . NSTEMI (non-ST elevated myocardial infarction)   . Enlarged prostate   . Major depressive disorder   . Constipation    Past Surgical History  Procedure Laterality Date  . Hernia repair     Family History  Problem Relation Age of Onset  . Heart attack Father 23  . Cancer Mother 61   History  Substance Use Topics  . Smoking status: Former Research scientist (life sciences)  . Smokeless tobacco: Not on file  . Alcohol Use: No    Review of Systems  Constitutional: Negative for fever.  HENT: Negative for drooling and rhinorrhea.   Eyes: Negative for pain.  Respiratory: Negative for cough and shortness of breath.   Cardiovascular: Negative for chest pain and leg swelling.  Gastrointestinal: Negative for nausea, vomiting, abdominal pain and diarrhea.  Genitourinary: Negative for dysuria and hematuria.  Musculoskeletal: Negative for gait problem and neck pain.  Skin: Negative for color change.    Neurological: Negative for numbness and headaches.  Hematological: Negative for adenopathy.  Psychiatric/Behavioral: Negative for behavioral problems.  All other systems reviewed and are negative.     Allergies  Review of patient's allergies indicates no known allergies.  Home Medications   Prior to Admission medications   Medication Sig Start Date End Date Taking? Authorizing Provider  Amino Acids-Protein Hydrolys (FEEDING SUPPLEMENT, PRO-STAT SUGAR FREE 64,) LIQD Take 30 mLs by mouth 2 (two) times daily.    Historical Provider, MD  aspirin 325 MG tablet Take 1 tablet (325 mg total) by mouth daily. 01/21/14   Alonza Bogus, MD  atorvastatin (LIPITOR) 40 MG tablet Take 1 tablet (40 mg total) by mouth daily at 6 PM. 01/21/14   Alonza Bogus, MD  bisacodyl (DULCOLAX) 10 MG suppository Place 10 mg rectally as needed for moderate constipation.    Historical Provider, MD  cephALEXin (KEFLEX) 500 MG capsule Take 1 capsule (500 mg total) by mouth 4 (four) times daily. 02/09/14   Maudry Diego, MD  cephALEXin (KEFLEX) 500 MG capsule Take 1 capsule (500 mg total) by mouth 4 (four) times daily. 02/09/14   Maudry Diego, MD  ciprofloxacin (CIPRO) 500 MG tablet Take 500 mg by mouth 2 (two) times daily. Started 8/14 for 10 days for UTI    Historical Provider, MD  ENSURE (ENSURE) Take 237 mLs by mouth 2 (two) times daily.    Historical Provider, MD  finasteride (PROSCAR) 5 MG tablet Take 1 tablet by mouth daily. 08/27/13   Historical  Provider, MD  heparin 5000 UNIT/ML injection Inject 1 mL (5,000 Units total) into the skin every 8 (eight) hours. 11/07/13   Alonza Bogus, MD  HYDROcodone-acetaminophen (NORCO/VICODIN) 5-325 MG per tablet Take 1 tablet by mouth every 4 (four) hours as needed for moderate pain. 11/07/13   Alonza Bogus, MD  lisinopril (PRINIVIL,ZESTRIL) 2.5 MG tablet Take 1 tablet (2.5 mg total) by mouth daily. 01/21/14   Alonza Bogus, MD  LORazepam (ATIVAN) 0.5 MG tablet Take  0.5 mg by mouth every 6 (six) hours as needed for anxiety.    Historical Provider, MD  metoprolol tartrate (LOPRESSOR) 12.5 mg TABS tablet Take 0.5 tablets (12.5 mg total) by mouth 2 (two) times daily. 01/21/14   Alonza Bogus, MD  omeprazole (PRILOSEC) 20 MG capsule Take 20 mg by mouth daily.    Historical Provider, MD  piperacillin-tazobactam (ZOSYN) 3.375 GM/50ML IVPB Inject 3.375 g into the vein every 6 (six) hours. For 7 days started on 02/08/2014    Historical Provider, MD  polyethylene glycol (MIRALAX / GLYCOLAX) packet Take 17 g by mouth daily.    Historical Provider, MD  potassium chloride SA (K-DUR,KLOR-CON) 20 MEQ tablet Take 40 mEq by mouth daily.     Historical Provider, MD  predniSONE (DELTASONE) 10 MG tablet Take 1 tablet (10 mg total) by mouth daily with breakfast. 11/07/13   Alonza Bogus, MD  senna-docusate (SENOKOT-S) 8.6-50 MG per tablet Take 1 tablet by mouth 2 (two) times daily.    Historical Provider, MD  sertraline (ZOLOFT) 50 MG tablet Take 25 mg by mouth daily.    Historical Provider, MD  torsemide (DEMADEX) 20 MG tablet Take 1 tablet by mouth daily. 08/27/13   Historical Provider, MD  Vancomycin HCl in Dextrose (VANCOCIN HCL IV) Inject 1,250 mg into the vein 2 (two) times daily.    Historical Provider, MD   BP 102/66 mmHg  Pulse 116  Temp(Src) 98.5 F (36.9 C)  Resp 18  SpO2 98% Physical Exam  Constitutional: He is oriented to person, place, and time. He appears well-developed and well-nourished.  HENT:  Head: Normocephalic and atraumatic.  Right Ear: External ear normal.  Left Ear: External ear normal.  Nose: Nose normal.  Mouth/Throat: Oropharynx is clear and moist. No oropharyngeal exudate.  Eyes: Conjunctivae and EOM are normal. Pupils are equal, round, and reactive to light.  Neck: Normal range of motion. Neck supple.  Cardiovascular: Normal rate, regular rhythm, normal heart sounds and intact distal pulses.  Exam reveals no gallop and no friction rub.    No murmur heard. Pulmonary/Chest: Effort normal and breath sounds normal. No respiratory distress. He has no wheezes.  Abdominal: Soft. Bowel sounds are normal. He exhibits no distension. There is no tenderness. There is no rebound and no guarding.  Musculoskeletal: Normal range of motion. He exhibits no edema or tenderness.  Mild desquamating rash noted on the buttocks.  Neurological: He is alert and oriented to person, place, and time.  Skin: Skin is warm and dry.  Ulcerative wounds as pictured noted on the right foot, distal first digit and also the right heel.   Normal cap refill noted in bilateral lower distal extremities.    Psychiatric: He has a normal mood and affect. His behavior is normal.  Nursing note and vitals reviewed.   ED Course  Procedures (including critical care time) Labs Review Labs Reviewed  CBC WITH DIFFERENTIAL - Abnormal; Notable for the following:    WBC 16.4 (*)  Hemoglobin 12.9 (*)    Neutrophils Relative % 82 (*)    Neutro Abs 13.6 (*)    All other components within normal limits  COMPREHENSIVE METABOLIC PANEL - Abnormal; Notable for the following:    Glucose, Bld 171 (*)    BUN 26 (*)    Albumin 3.3 (*)    Anion gap 18 (*)    All other components within normal limits  SEDIMENTATION RATE - Abnormal; Notable for the following:    Sed Rate 21 (*)    All other components within normal limits  C-REACTIVE PROTEIN - Abnormal; Notable for the following:    CRP 0.6 (*)    All other components within normal limits  CULTURE, BLOOD (ROUTINE X 2)  CULTURE, BLOOD (ROUTINE X 2)  MRSA PCR SCREENING  COMPREHENSIVE METABOLIC PANEL  CBC WITH DIFFERENTIAL    Imaging Review Dg Foot Complete Right  05/27/2014   CLINICAL DATA:  Ulcer on the distal right great toe and heel. Concern for possible osteomyelitis. Wounds have been present for 2 months. Numbness in the foot. No diabetes.  EXAM: RIGHT FOOT COMPLETE - 3+ VIEW  COMPARISON:  09/05/2006  FINDINGS:  There is no evidence of fracture or dislocation. Small plantar and Achilles spurs are present. Soft tissues are unremarkable.  IMPRESSION: No evidence for acute  abnormality.   Electronically Signed   By: Shon Hale M.D.   On: 05/27/2014 19:55     EKG Interpretation None            Date: 05/27/2014  Rate: 101  Rhythm: sinus tachycardia  QRS Axis: normal  Intervals: normal  ST/T Wave abnormalities: normal  Conduction Disutrbances:none  Narrative Interpretation: No ST or T wave changes consistent with ischemia.    Old EKG Reviewed: unchanged   MDM   Final diagnoses:  Foot pain  Open toe wound, initial encounter    6:22 PM 80 y.o. male w hx of HTN, NSTEMI, chronic right foot wound who presents for evaluation of his right foot wound. He was recently seen by the vascular surgeon in the office today who was concerned for osteomyelitis. The patient denies any fevers, chills, body aches, or other systemic symptoms. He has been being treated conservatively as an outpatient for his right foot wounds. The vascular surgeon believes he needs more aggressive treatment to save the foot. We'll get screening labs and imaging.  Will have nursing doppler LE pulses as they are not easily palpated.   Ortho consulted. Will admit to hospitalist. Will tx w/ vanc/rocephin to cover for osteo.   Pamella Pert, MD 05/28/14 [phone]

## 2014-05-27 NOTE — ED Notes (Addendum)
Pt reports to the ED from the vascular MDs office for eval of right great toe injury and infection. Pt reports his shoe rubbed a blister on his toe and it has gotten infected. Denies hx of DM. Cap refill > 3 seconds. Dressing from vascular office in place. Pt afebrile. He is A&Ox4, resp e/u, and skin warm and dry.

## 2014-05-27 NOTE — ED Notes (Signed)
Attempted report 

## 2014-05-27 NOTE — ED Notes (Signed)
Pts right toe is weeping and has areas of what appears to be necrosis. He also has a blister that encompasses his right heel that is red and swollen.

## 2014-05-27 NOTE — Progress Notes (Signed)
ANTIBIOTIC CONSULT NOTE - INITIAL  Pharmacy Consult for vancomycin and zosyn Indication: osteomyelitis  No Known Allergies  Patient Measurements: weight 91 kg, height 73 inches   Vital Signs: Temp: 98.5 F (36.9 C) (12/10 1548) BP: 107/80 mmHg (12/10 2100) Pulse Rate: 99 (12/10 2100) Intake/Output from previous day:   Intake/Output from this shift:    Labs:  Recent Labs  05/27/14 1548  WBC 16.4*  HGB 12.9*  PLT 321  CREATININE 0.58   Estimated Creatinine Clearance: 86 mL/min (by C-G formula based on Cr of 0.58). No results for input(s): VANCOTROUGH, VANCOPEAK, VANCORANDOM, GENTTROUGH, GENTPEAK, GENTRANDOM, TOBRATROUGH, TOBRAPEAK, TOBRARND, AMIKACINPEAK, AMIKACINTROU, AMIKACIN in the last 72 hours.   Microbiology: No results found for this or any previous visit (from the past 720 hour(s)).  Medical History: Past Medical History  Diagnosis Date  . Hypertension   . Neuropathy   . Prostate enlargement   . DDD (degenerative disc disease), cervical   . CIDP (chronic inflammatory demyelinating polyneuropathy)   . Gait disorder   . Dysphagia   . NSTEMI (non-ST elevated myocardial infarction)   . Enlarged prostate   . Major depressive disorder   . Constipation     Medications:  See med rec  Assessment: Patient is a 80 y.o M presented with right great toe wound.  To start vancomycin and zosyn for suspected osteomyelitis.  Goal of Therapy:  Vancomycin trough level 15-20 mcg/ml  Plan:  1) vancomycin 1gm IV q8h 2) zosyn 3.375gm IV x1 over 30 minutes, then 3.375gm IV q8h over 4 hours 3) vancomycin level at steady state  Vence Lalor P 05/27/2014,9:33 PM

## 2014-05-28 ENCOUNTER — Encounter (HOSPITAL_COMMUNITY): Payer: Self-pay | Admitting: *Deleted

## 2014-05-28 DIAGNOSIS — L03031 Cellulitis of right toe: Secondary | ICD-10-CM | POA: Diagnosis present

## 2014-05-28 DIAGNOSIS — F329 Major depressive disorder, single episode, unspecified: Secondary | ICD-10-CM | POA: Diagnosis present

## 2014-05-28 DIAGNOSIS — I10 Essential (primary) hypertension: Secondary | ICD-10-CM

## 2014-05-28 DIAGNOSIS — M869 Osteomyelitis, unspecified: Secondary | ICD-10-CM

## 2014-05-28 DIAGNOSIS — G6181 Chronic inflammatory demyelinating polyneuritis: Secondary | ICD-10-CM | POA: Diagnosis present

## 2014-05-28 DIAGNOSIS — N390 Urinary tract infection, site not specified: Secondary | ICD-10-CM | POA: Diagnosis present

## 2014-05-28 DIAGNOSIS — Z87891 Personal history of nicotine dependence: Secondary | ICD-10-CM | POA: Diagnosis not present

## 2014-05-28 DIAGNOSIS — M79673 Pain in unspecified foot: Secondary | ICD-10-CM | POA: Diagnosis present

## 2014-05-28 DIAGNOSIS — L97519 Non-pressure chronic ulcer of other part of right foot with unspecified severity: Secondary | ICD-10-CM | POA: Diagnosis present

## 2014-05-28 DIAGNOSIS — N400 Enlarged prostate without lower urinary tract symptoms: Secondary | ICD-10-CM | POA: Diagnosis present

## 2014-05-28 DIAGNOSIS — Z993 Dependence on wheelchair: Secondary | ICD-10-CM | POA: Diagnosis not present

## 2014-05-28 DIAGNOSIS — Z7982 Long term (current) use of aspirin: Secondary | ICD-10-CM | POA: Diagnosis not present

## 2014-05-28 DIAGNOSIS — I519 Heart disease, unspecified: Secondary | ICD-10-CM

## 2014-05-28 DIAGNOSIS — L89152 Pressure ulcer of sacral region, stage 2: Secondary | ICD-10-CM | POA: Diagnosis present

## 2014-05-28 DIAGNOSIS — I2510 Atherosclerotic heart disease of native coronary artery without angina pectoris: Secondary | ICD-10-CM | POA: Diagnosis present

## 2014-05-28 DIAGNOSIS — G629 Polyneuropathy, unspecified: Secondary | ICD-10-CM

## 2014-05-28 DIAGNOSIS — I252 Old myocardial infarction: Secondary | ICD-10-CM | POA: Diagnosis not present

## 2014-05-28 DIAGNOSIS — K5900 Constipation, unspecified: Secondary | ICD-10-CM | POA: Diagnosis present

## 2014-05-28 LAB — URINALYSIS, ROUTINE W REFLEX MICROSCOPIC
Bilirubin Urine: NEGATIVE
GLUCOSE, UA: NEGATIVE mg/dL
Ketones, ur: NEGATIVE mg/dL
Nitrite: NEGATIVE
Protein, ur: NEGATIVE mg/dL
Specific Gravity, Urine: 1.019 (ref 1.005–1.030)
UROBILINOGEN UA: 0.2 mg/dL (ref 0.0–1.0)
pH: 6.5 (ref 5.0–8.0)

## 2014-05-28 LAB — COMPREHENSIVE METABOLIC PANEL
ALT: 32 U/L (ref 0–53)
ANION GAP: 11 (ref 5–15)
AST: 15 U/L (ref 0–37)
Albumin: 2.8 g/dL — ABNORMAL LOW (ref 3.5–5.2)
Alkaline Phosphatase: 66 U/L (ref 39–117)
BILIRUBIN TOTAL: 0.3 mg/dL (ref 0.3–1.2)
BUN: 24 mg/dL — AB (ref 6–23)
CHLORIDE: 103 meq/L (ref 96–112)
CO2: 28 meq/L (ref 19–32)
CREATININE: 0.58 mg/dL (ref 0.50–1.35)
Calcium: 9.1 mg/dL (ref 8.4–10.5)
GFR calc Af Amer: >90 mL/min (ref >90)
Glucose, Bld: 113 mg/dL — ABNORMAL HIGH (ref 70–99)
POTASSIUM: 3.2 meq/L — AB (ref 3.7–5.3)
Sodium: 142 mEq/L (ref 137–147)
Total Protein: 5.8 g/dL — ABNORMAL LOW (ref 6.0–8.3)

## 2014-05-28 LAB — URINE MICROSCOPIC-ADD ON

## 2014-05-28 LAB — CBC WITH DIFFERENTIAL/PLATELET
BASOS PCT: 0 % (ref 0–1)
Basophils Absolute: 0 10*3/uL (ref 0.0–0.1)
EOS PCT: 3 % (ref 0–5)
Eosinophils Absolute: 0.4 10*3/uL (ref 0.0–0.7)
HEMATOCRIT: 36.3 % — AB (ref 39.0–52.0)
Hemoglobin: 11.4 g/dL — ABNORMAL LOW (ref 13.0–17.0)
Lymphocytes Relative: 17 % (ref 12–46)
Lymphs Abs: 2.1 10*3/uL (ref 0.7–4.0)
MCH: 27.1 pg (ref 26.0–34.0)
MCHC: 31.4 g/dL (ref 30.0–36.0)
MCV: 86.4 fL (ref 78.0–100.0)
MONO ABS: 1 10*3/uL (ref 0.1–1.0)
Monocytes Relative: 9 % (ref 3–12)
Neutro Abs: 8.7 10*3/uL — ABNORMAL HIGH (ref 1.7–7.7)
Neutrophils Relative %: 71 % (ref 43–77)
Platelets: 223 10*3/uL (ref 150–400)
RBC: 4.2 MIL/uL — ABNORMAL LOW (ref 4.22–5.81)
RDW: 14.6 % (ref 11.5–15.5)
WBC: 12.3 10*3/uL — ABNORMAL HIGH (ref 4.0–10.5)

## 2014-05-28 LAB — MRSA PCR SCREENING: MRSA by PCR: NEGATIVE

## 2014-05-28 MED ORDER — FLUCONAZOLE 100 MG PO TABS
100.0000 mg | ORAL_TABLET | Freq: Every day | ORAL | Status: DC
  Administered 2014-05-28 – 2014-05-31 (×4): 100 mg via ORAL
  Filled 2014-05-28 (×4): qty 1

## 2014-05-28 MED ORDER — MUPIROCIN 2 % EX OINT
TOPICAL_OINTMENT | Freq: Two times a day (BID) | CUTANEOUS | Status: DC
  Administered 2014-05-28 – 2014-05-29 (×3): via NASAL
  Administered 2014-05-30: 1 via NASAL
  Administered 2014-05-30 – 2014-05-31 (×2): via NASAL
  Filled 2014-05-28: qty 22

## 2014-05-28 MED ORDER — SODIUM CHLORIDE 0.9 % IV BOLUS (SEPSIS)
500.0000 mL | Freq: Once | INTRAVENOUS | Status: AC
  Administered 2014-05-28: 500 mL via INTRAVENOUS

## 2014-05-28 MED ORDER — POTASSIUM CHLORIDE CRYS ER 20 MEQ PO TBCR
40.0000 meq | EXTENDED_RELEASE_TABLET | Freq: Four times a day (QID) | ORAL | Status: AC
  Administered 2014-05-28 (×2): 40 meq via ORAL
  Filled 2014-05-28 (×2): qty 2

## 2014-05-28 NOTE — Progress Notes (Signed)
The 1400 Zosyn dose was missed due to an unopened clamp not discovered until next dose due.

## 2014-05-28 NOTE — Consult Note (Signed)
WOC wound consult note Reason for Consult: integumentary system:  Large area of moisture associated skin damage (MASD), specifically IAD (Incontinence associated dermatitis) on bilateral buttocks. Right great toe necrotic area and right heel with Stage II PrU Wound type:MASD, specifically IAD and neuropathic ulcers of right foot. Pressure Ulcer POA: Yes Measurement:Right great toe (lateral aspect): 2cm x 1cm (no depth) area of necrotic tissue, stable.  Rigt heel:  PArtial thickness, Stage II Pressure Ulcer measuring 2cm x 2cm x 0.2cm.   Wound bed: Right heel:  Clean, pink, moist Drainage (amount, consistency, odor) scant serous Periwound:Intact, cool. Dressing procedure/placement/frequency: I will implement a betadine swabstick application to the right great toe in an effort to keep area clean, dry and infection-free as orthopedics follows.  I will add a soft silicone foam dressing to the right heel partial thickness (Stage II) PrU and bilateral pressure redistribution boots.  I have provided a therapeutic mattress with low air loss feature to treat the large area of moisture associated skin damage as I suspect that diaper use and supine position are leading to fungal overgrowth as a confounding complication of wetness.  I discussed this with Dr. Hartford Poli and he will consider either one or two doses of a systemic antifungal. Nursing staff has been given guidance for turning and repositioning and also for minimizing number and type of layers beneath the patient and between him and this therapeutic sleep surface.  Topical skin care is ordered. [place] nursing team will not follow, but will remain available to this patient, the nursing and medical team.  Please re-consult if needed. Thanks, Maudie Flakes, MSN, RN, [place], [place], [facility] [phone])

## 2014-05-28 NOTE — Progress Notes (Signed)
INITIAL NUTRITION ASSESSMENT  DOCUMENTATION CODES Per approved criteria  -Obesity Unspecified   INTERVENTION: -Continue Ensure Complete po BID, each supplement provides 350 kcal and 13 grams of protein -Ground meats with meals  NUTRITION DIAGNOSIS: Increased nutrient needs related to wound healing as evidenced by estimated nutriton needs.   Goal: Pt will meet >90% of estimated nutritional needs  Monitor:  PO/supplement intake, labs, weight changes, I/O's  Reason for Assessment: MST=3  80 y.o. male  Admitting Dx: Osteomyelitis  This is a 80 y.o. year old male with significant past medical history of HTN, CAD s/p NSTEMI, Grade 1 diastolic dysfunction, CIDP presenting with R foot osteomyelitis. Pt resident of local SNF. Pt has had >2-3 months of R great toe erythema, swelling, pain, drainage. Has been managed by SNF wound care.   ASSESSMENT: Pt admitted with osteomyelitis. He reports he is a long term resident of [facility], living there for about 9 months. He reports his wounds are chronic, but have become progressively worse over time. Wound Care RN consulted.  Pt reports intake is variable, but this is attributed more to food selection than appetite changes ("If I like what they give me, I'll eat it. If I don't, I won't"). He reports trouble chewing meats and is agreeable to receiving ground meats. He reports hx of weight loss, but unable to quantify amount or time frame. He reports his lowest wt was 200#. Most recent wt was 233#. Verified wt on bed scale and yielded 110.6 kg.  Pt receives Ensure BID and reports he drinks it when offered. He reports he receives a similar product at [facility], but unable to recall the name. He reports he has already drank 2 supplements today. Noted an empty Ensure bottle in trash can at bedside.  Muscle loss likely due to advanced age and limited mobility. Pt reports limited control of left arm ("I think it was from polio") and has been bedbound for quite  some time. He has been receiving therapy at [place].  Educated pt on importance of good PO intake to promote healing.  Labs reviewed. K: 3.2, BUN: 24, Glucose: 113.   Nutrition Focused Physical Exam:  Subcutaneous Fat:  Orbital Region: mild depletion Upper Arm Region: mild depletion Thoracic and Lumbar Region: WDL  Muscle:  Temple Region: mild depletion Clavicle Bone Region: WDL Clavicle and Acromion Bone Region: WDL Scapular Bone Region: WDL Dorsal Hand: mild depletion Patellar Region: mild depletion Anterior Thigh Region: mild depletion Posterior Calf Region: mild depletion  Edema: none present  Height: Ht Readings from Last 1 Encounters:  05/27/14 6\' 1"  (1.854 m)    Weight: Wt Readings from Last 1 Encounters:  05/28/14 233 lb 11 oz (106 kg)    Ideal Body Weight: 184#  % Ideal Body Weight: 127%  Wt Readings from Last 10 Encounters:  05/28/14 233 lb 11 oz (106 kg)  05/27/14 200 lb (90.719 kg)  01/19/14 226 lb 9.6 oz (102.785 kg)  11/07/13 223 lb 15.8 oz (101.6 kg)  05/12/11 245 lb (111.131 kg)    Usual Body Weight: 220#  % Usual Body Weight: 106%  BMI:  Body mass index is 30.84 kg/(m^2). Obesity, class I  Estimated Nutritional Needs: Kcal: 2400-2600 Protein: 116-126 grams Fluid: 2.4-2.6 L  Skin: multiple pressure ulcers- stage II sacrum, unstageable toe, stage II lt heel, stage II rt heel  Diet Order: Diet Heart  EDUCATION NEEDS: -Education needs addressed   Intake/Output Summary (Last 24 hours) at 05/28/14 1406 Last data filed at 05/28/14 1131  Gross per 24 hour  Intake  637.5 ml  Output    700 ml  Net  -62.5 ml    Last BM: 05/27/14  Labs:   Recent Labs Lab 05/27/14 1548 05/28/14 0612  NA 137 142  K 4.1 3.2*  CL 97 103  CO2 22 28  BUN 26* 24*  CREATININE 0.58 0.58  CALCIUM 9.5 9.1  GLUCOSE 171* 113*    CBG (last 3)  No results for input(s): GLUCAP in the last 72 hours.  Scheduled Meds: . aspirin  325 mg Oral Daily  .  atorvastatin  40 mg Oral q1800  . clopidogrel  75 mg Oral Daily  . feeding supplement (ENSURE COMPLETE)  237 mL Oral BID BM  . finasteride  5 mg Oral Daily  . fluconazole  100 mg Oral Daily  . heparin  5,000 Units Subcutaneous 3 times per day  . lisinopril  2.5 mg Oral Daily  . metoprolol tartrate  12.5 mg Oral BID  . mupirocin ointment   Nasal BID  . pantoprazole  40 mg Oral Daily  . piperacillin-tazobactam (ZOSYN)  IV  3.375 g Intravenous Q8H  . polyethylene glycol  17 g Oral Daily  . potassium chloride SA  20 mEq Oral Daily  . potassium chloride  40 mEq Oral Q6H  . sertraline  25 mg Oral Daily  . sodium chloride  3 mL Intravenous Q12H  . torsemide  20 mg Oral Daily  . vancomycin  1,000 mg Intravenous Q8H    Continuous Infusions: . sodium chloride 10 mL/hr at 05/28/14 1028    Past Medical History  Diagnosis Date  . Hypertension   . Neuropathy   . Prostate enlargement   . DDD (degenerative disc disease), cervical   . CIDP (chronic inflammatory demyelinating polyneuropathy)   . Gait disorder   . Dysphagia   . NSTEMI (non-ST elevated myocardial infarction)   . Enlarged prostate   . Major depressive disorder   . Constipation     Past Surgical History  Procedure Laterality Date  . Hernia repair      Clarissia Mckeen A. Jimmye Norman, RD, LDN Pager: [phone] After hours Pager: [phone]

## 2014-05-28 NOTE — Progress Notes (Signed)
[facility] PROGRESS NOTE   Dylan Nelson FMB:[id number] DOB: 08/31/1933 DOA: 05/27/2014 PCP: Alonza Bogus, MD  HPI/Subjective: No complaints overnight.  Assessment/Plan: Principal Problem:   Osteomyelitis Active Problems:   HTN (hypertension)   Neuropathy   Diastolic dysfunction    Right great toe osteomyelitis Patient was following as outpatient with the wound clinic. Presented because of worsening. Started on IV vancomycin and Zosyn after blood cultures obtained. MRI of the right foot is pending, orthopedics following.  CAD status post MI Patient had non-STEMI in August 2015. Currently on aspirin and Plavix, baseline grade 1 diastolic dysfunction on echo in May 2015. Currently no active chest pain, continue regimen. No active symptoms of CAD or CHF, continue current medications.  Hypertension Stable, patient is euvolemic, continue home medications.  Code Status: Full code Family Communication: Plan discussed with the patient. Disposition Plan: Remains inpatient   Consultants:  Xu  Procedures:  None  Antibiotics:  Vancomycin and Zosyn.   Objective: Filed Vitals:   05/28/14 0603  BP: 92/62  Pulse: 95  Temp: 98.4 F (36.9 C)  Resp: 18    Intake/Output Summary (Last 24 hours) at 05/28/14 0920 Last data filed at 05/28/14 0443  Gross per 24 hour  Intake  637.5 ml  Output    200 ml  Net  437.5 ml   Filed Weights   05/27/14 2300 05/28/14 0718  Weight: 105 kg (231 lb 7.7 oz) 106 kg (233 lb 11 oz)    Exam: General: Alert and awake, oriented x3, not in any acute distress. HEENT: anicteric sclera, pupils reactive to light and accommodation, EOMI CVS: S1-S2 clear, no murmur rubs or gallops Chest: clear to auscultation bilaterally, no wheezing, rales or rhonchi Abdomen: soft nontender, nondistended, normal bowel sounds, no organomegaly Extremities: no cyanosis, clubbing or edema noted bilaterally Neuro: Cranial nerves II-XII intact, no  focal neurological deficits  Data Reviewed: Basic Metabolic Panel:  Recent Labs Lab 05/27/14 1548 05/28/14 0612  NA 137 142  K 4.1 3.2*  CL 97 103  CO2 22 28  GLUCOSE 171* 113*  BUN 26* 24*  CREATININE 0.58 0.58  CALCIUM 9.5 9.1   Liver Function Tests:  Recent Labs Lab 05/27/14 1548 05/28/14 0612  AST 28 15  ALT 50 32  ALKPHOS 82 66  BILITOT 0.3 0.3  PROT 6.9 5.8*  ALBUMIN 3.3* 2.8*   No results for input(s): LIPASE, AMYLASE in the last 168 hours. No results for input(s): AMMONIA in the last 168 hours. CBC:  Recent Labs Lab 05/27/14 1548 05/28/14 0612  WBC 16.4* 12.3*  NEUTROABS 13.6* 8.7*  HGB 12.9* 11.4*  HCT 40.6 36.3*  MCV 86.2 86.4  PLT 321 223   Cardiac Enzymes: No results for input(s): CKTOTAL, CKMB, CKMBINDEX, TROPONINI in the last 168 hours. BNP (last 3 results)  Recent Labs  11/02/13 1257 11/03/13 0517  PROBNP 139.9 146.3   CBG: No results for input(s): GLUCAP in the last 168 hours.  Micro Recent Results (from the past 240 hour(s))  MRSA PCR Screening     Status: None   Collection Time: 05/27/14 10:51 PM  Result Value Ref Range Status   MRSA by PCR NEGATIVE NEGATIVE Final    Comment:        The GeneXpert MRSA Assay (FDA approved for NASAL specimens only), is one component of a comprehensive MRSA colonization surveillance program. It is not intended to diagnose MRSA infection nor to guide or monitor treatment for MRSA infections.  Studies: Dg Foot Complete Right  06-06-14   CLINICAL DATA:  Ulcer on the distal right great toe and heel. Concern for possible osteomyelitis. Wounds have been present for 2 months. Numbness in the foot. No diabetes.  EXAM: RIGHT FOOT COMPLETE - 3+ VIEW  COMPARISON:  09/05/2006  FINDINGS: There is no evidence of fracture or dislocation. Small plantar and Achilles spurs are present. Soft tissues are unremarkable.  IMPRESSION: No evidence for acute  abnormality.   Electronically Signed   By: Shon Hale M.D.   On: Jun 06, 2014 19:55    Scheduled Meds: . aspirin  325 mg Oral Daily  . atorvastatin  40 mg Oral q1800  . clopidogrel  75 mg Oral Daily  . feeding supplement (ENSURE COMPLETE)  237 mL Oral BID BM  . finasteride  5 mg Oral Daily  . heparin  5,000 Units Subcutaneous 3 times per day  . lisinopril  2.5 mg Oral Daily  . metoprolol tartrate  12.5 mg Oral BID  . mupirocin ointment   Nasal BID  . pantoprazole  40 mg Oral Daily  . piperacillin-tazobactam (ZOSYN)  IV  3.375 g Intravenous Q8H  . polyethylene glycol  17 g Oral Daily  . potassium chloride SA  20 mEq Oral Daily  . potassium chloride  40 mEq Oral Q6H  . sertraline  25 mg Oral Daily  . sodium chloride  3 mL Intravenous Q12H  . torsemide  20 mg Oral Daily  . triamcinolone cream  1 application Topical BID  . vancomycin  1,000 mg Intravenous Q8H  . zinc oxide  1 application Topical TID   Continuous Infusions: . sodium chloride 75 mL/hr at 05/28/14 0000       Time spent: 35 minutes    [facility] A  [facility] Pager [phone] If 7PM-7AM, please contact night-coverage at [email or web address], password [facility] 05/28/2014, 9:20 AM  LOS: 1 day

## 2014-05-28 NOTE — Progress Notes (Signed)
UR completed 

## 2014-05-28 NOTE — Consult Note (Addendum)
ORTHOPAEDIC CONSULTATION  REQUESTING PHYSICIAN: Shanda Howells, MD  Chief Complaint: Right great toe and heel ulcers  HPI: Dylan Nelson is a 80 y.o. male who complains of right great toe and heel ulcers.  Patient has neuropathy.  Does not have DM.  Was asked by vascular surgery to evaluate wounds.    Past Medical History  Diagnosis Date  . Hypertension   . Neuropathy   . Prostate enlargement   . DDD (degenerative disc disease), cervical   . CIDP (chronic inflammatory demyelinating polyneuropathy)   . Gait disorder   . Dysphagia   . NSTEMI (non-ST elevated myocardial infarction)   . Enlarged prostate   . Major depressive disorder   . Constipation    Past Surgical History  Procedure Laterality Date  . Hernia repair     History   Social History  . Marital Status: Married    Spouse Name: N/A    Number of Children: N/A  . Years of Education: N/A   Social History Main Topics  . Smoking status: Former Research scientist (life sciences)  . Smokeless tobacco: None  . Alcohol Use: No  . Drug Use: No  . Sexual Activity: No   Other Topics Concern  . None   Social History Narrative   Family History  Problem Relation Age of Onset  . Heart attack Father 1  . Cancer Mother 35   No Known Allergies Prior to Admission medications   Medication Sig Start Date End Date Taking? Authorizing Provider  acetaminophen (TYLENOL) 325 MG tablet Take 650 mg by mouth every 6 (six) hours as needed for mild pain or fever.   Yes Historical Provider, MD  acidophilus (RISAQUAD) CAPS capsule Take 1 capsule by mouth daily.   Yes Historical Provider, MD  Amino Acids-Protein Hydrolys (FEEDING SUPPLEMENT, PRO-STAT SUGAR FREE 64,) LIQD Take 30 mLs by mouth 2 (two) times daily.   Yes Historical Provider, MD  aspirin 325 MG tablet Take 1 tablet (325 mg total) by mouth daily. 01/21/14  Yes Alonza Bogus, MD  atorvastatin (LIPITOR) 40 MG tablet Take 1 tablet (40 mg total) by mouth daily at 6 PM. 01/21/14  Yes Alonza Bogus, MD   ciprofloxacin (CIPRO) 500 MG tablet Take 500 mg by mouth 2 (two) times daily. Started 8/14 for 10 days for UTI   Yes Historical Provider, MD  clopidogrel (PLAVIX) 75 MG tablet Take 75 mg by mouth daily.   Yes Historical Provider, MD  ENSURE (ENSURE) Take 237 mLs by mouth 2 (two) times daily.   Yes Historical Provider, MD  finasteride (PROSCAR) 5 MG tablet Take 1 tablet by mouth daily. 08/27/13  Yes Historical Provider, MD  lisinopril (PRINIVIL,ZESTRIL) 2.5 MG tablet Take 1 tablet (2.5 mg total) by mouth daily. 01/21/14  Yes Alonza Bogus, MD  metoprolol tartrate (LOPRESSOR) 25 MG tablet Take 12.5 mg by mouth 2 (two) times daily.   Yes Historical Provider, MD  omeprazole (PRILOSEC) 20 MG capsule Take 20 mg by mouth daily.   Yes Historical Provider, MD  polyethylene glycol (MIRALAX / GLYCOLAX) packet Take 17 g by mouth daily.   Yes Historical Provider, MD  potassium chloride SA (K-DUR,KLOR-CON) 20 MEQ tablet Take 20 mEq by mouth daily.    Yes Historical Provider, MD  predniSONE (DELTASONE) 10 MG tablet Take 1 tablet (10 mg total) by mouth daily with breakfast. 11/07/13  Yes Alonza Bogus, MD  senna-docusate (SENOKOT-S) 8.6-50 MG per tablet Take 1 tablet by mouth 2 (two) times daily.  Yes Historical Provider, MD  sertraline (ZOLOFT) 50 MG tablet Take 25 mg by mouth daily.   Yes Historical Provider, MD  torsemide (DEMADEX) 20 MG tablet Take 1 tablet by mouth daily. 08/27/13  Yes Historical Provider, MD  triamcinolone cream (KENALOG) 0.1 % Apply 1 application topically 2 (two) times daily.   Yes Historical Provider, MD  ZINC OXIDE, TOPICAL, 10 % CREA Apply 1 application topically 3 (three) times daily. Apply to right buttock topically every shift for sheared area on right buttock until healed.   Yes Historical Provider, MD  bisacodyl (DULCOLAX) 10 MG suppository Place 10 mg rectally as needed for moderate constipation.    Historical Provider, MD  cephALEXin (KEFLEX) 500 MG capsule Take 1 capsule (500  mg total) by mouth 4 (four) times daily. Patient not taking: Reported on 05/27/2014 02/09/14   Maudry Diego, MD  cephALEXin (KEFLEX) 500 MG capsule Take 1 capsule (500 mg total) by mouth 4 (four) times daily. Patient not taking: Reported on 05/27/2014 02/09/14   Maudry Diego, MD  heparin 5000 UNIT/ML injection Inject 1 mL (5,000 Units total) into the skin every 8 (eight) hours. Patient not taking: Reported on 05/27/2014 11/07/13   Alonza Bogus, MD  HYDROcodone-acetaminophen (NORCO/VICODIN) 5-325 MG per tablet Take 1 tablet by mouth every 4 (four) hours as needed for moderate pain. 11/07/13   Alonza Bogus, MD  LORazepam (ATIVAN) 0.5 MG tablet Take 0.5 mg by mouth every 6 (six) hours as needed for anxiety.    Historical Provider, MD  metoprolol tartrate (LOPRESSOR) 12.5 mg TABS tablet Take 0.5 tablets (12.5 mg total) by mouth 2 (two) times daily. Patient not taking: Reported on 05/27/2014 01/21/14   Alonza Bogus, MD   Dg Foot Complete Right  05/27/2014   CLINICAL DATA:  Ulcer on the distal right great toe and heel. Concern for possible osteomyelitis. Wounds have been present for 2 months. Numbness in the foot. No diabetes.  EXAM: RIGHT FOOT COMPLETE - 3+ VIEW  COMPARISON:  09/05/2006  FINDINGS: There is no evidence of fracture or dislocation. Small plantar and Achilles spurs are present. Soft tissues are unremarkable.  IMPRESSION: No evidence for acute  abnormality.   Electronically Signed   By: Shon Hale M.D.   On: 05/27/2014 19:55    Positive ROS: All other systems have been reviewed and were otherwise negative with the exception of those mentioned in the HPI and as above.  Physical Exam: General: Alert, no acute distress Cardiovascular: No pedal edema Respiratory: No cyanosis, no use of accessory musculature GI: No organomegaly, abdomen is soft and non-tender Skin: No lesions in the area of chief complaint Neurologic: Sensation intact distally Psychiatric: Patient is competent  for consent with normal mood and affect Lymphatic: No axillary or cervical lymphadenopathy  MUSCULOSKELETAL:  - decreased sensation in right foot - wagner stage 1 on both great toe and heel - no purulence - 2+ pulses - foot wwp   Assessment: Right foot ulcers  Plan: - agree with IV abx - will attempt limb salvage - float heel and foot at all times, NWB to foot - MRI to r/o abscess - will likely have patient follow up as outpatient  Thank you for the consult and the opportunity to see Dylan Nelson. Dylan Roux, MD [place] 6:53 AM

## 2014-05-29 ENCOUNTER — Inpatient Hospital Stay (HOSPITAL_COMMUNITY): Payer: Medicare Other

## 2014-05-29 LAB — CBC WITH DIFFERENTIAL/PLATELET
BASOS ABS: 0 10*3/uL (ref 0.0–0.1)
Basophils Relative: 0 % (ref 0–1)
EOS ABS: 0.5 10*3/uL (ref 0.0–0.7)
Eosinophils Relative: 4 % (ref 0–5)
HEMATOCRIT: 35.5 % — AB (ref 39.0–52.0)
HEMOGLOBIN: 11 g/dL — AB (ref 13.0–17.0)
Lymphocytes Relative: 21 % (ref 12–46)
Lymphs Abs: 2.2 10*3/uL (ref 0.7–4.0)
MCH: 27 pg (ref 26.0–34.0)
MCHC: 31 g/dL (ref 30.0–36.0)
MCV: 87.2 fL (ref 78.0–100.0)
MONO ABS: 0.9 10*3/uL (ref 0.1–1.0)
MONOS PCT: 8 % (ref 3–12)
NEUTROS ABS: 7 10*3/uL (ref 1.7–7.7)
Neutrophils Relative %: 67 % (ref 43–77)
Platelets: 215 10*3/uL (ref 150–400)
RBC: 4.07 MIL/uL — ABNORMAL LOW (ref 4.22–5.81)
RDW: 14.8 % (ref 11.5–15.5)
WBC: 10.6 10*3/uL — ABNORMAL HIGH (ref 4.0–10.5)

## 2014-05-29 LAB — COMPREHENSIVE METABOLIC PANEL
ALBUMIN: 2.7 g/dL — AB (ref 3.5–5.2)
ALT: 24 U/L (ref 0–53)
ANION GAP: 13 (ref 5–15)
AST: 14 U/L (ref 0–37)
Alkaline Phosphatase: 64 U/L (ref 39–117)
BILIRUBIN TOTAL: 0.3 mg/dL (ref 0.3–1.2)
BUN: 16 mg/dL (ref 6–23)
CHLORIDE: 104 meq/L (ref 96–112)
CO2: 24 mEq/L (ref 19–32)
Calcium: 9.2 mg/dL (ref 8.4–10.5)
Creatinine, Ser: 0.56 mg/dL (ref 0.50–1.35)
GFR calc non Af Amer: >90 mL/min (ref >90)
GLUCOSE: 122 mg/dL — AB (ref 70–99)
Potassium: 4.2 mEq/L (ref 3.7–5.3)
SODIUM: 141 meq/L (ref 137–147)
TOTAL PROTEIN: 5.8 g/dL — AB (ref 6.0–8.3)

## 2014-05-29 MED ORDER — GADOBENATE DIMEGLUMINE 529 MG/ML IV SOLN
20.0000 mL | Freq: Once | INTRAVENOUS | Status: AC | PRN
  Administered 2014-05-29: 20 mL via INTRAVENOUS

## 2014-05-29 NOTE — Progress Notes (Signed)
[facility] PROGRESS NOTE   KENARD MORAWSKI IBB:[id number] DOB: August 13, 1933 DOA: 05/27/2014 PCP: Alonza Bogus, MD  HPI/Subjective: Denies any complaints overnight, no fever or chills.  Assessment/Plan: Principal Problem:   Osteomyelitis Active Problems:   HTN (hypertension)   Neuropathy   Diastolic dysfunction    Right great toe osteomyelitis Patient was following as outpatient with the wound clinic. Presented because of worsening. Started on IV vancomycin and Zosyn after blood cultures obtained. MRI of the right foot is pending, orthopedics following. Per orthopedics likely to be evaluated as outpatient, patient follows with the wound clinic as outpatient.  CAD status post MI Patient had non-STEMI in August 2015. Currently on aspirin and Plavix, baseline grade 1 diastolic dysfunction on echo in May 2015. Currently no active chest pain, continue regimen. No active symptoms of CAD or CHF, continue current medications.  Hypertension Stable, patient is euvolemic, continue home medications.  Sacral decubitus ulcer Patient seen by [facility] team, who recommended that her mattress and Diflucan as well as moisture barrier.  History of neuropathy Patient has CIDP, chronic inflammatory demyelinating polyneuropathy. Patient is wheelchair bound. PT/OT to evaluate and treat.  Code Status: Full code Family Communication: Plan discussed with the patient. Disposition Plan: Remains inpatient   Consultants:  Xu  Procedures:  None  Antibiotics:  Vancomycin and Zosyn.   Objective: Filed Vitals:   05/29/14 0955  BP: 113/62  Pulse: 97  Temp:   Resp:     Intake/Output Summary (Last 24 hours) at 05/29/14 1024 Last data filed at 05/29/14 1012  Gross per 24 hour  Intake   1460 ml  Output   1880 ml  Net   -420 ml   Filed Weights   05/27/14 2300 05/28/14 0718 05/29/14 0500  Weight: 105 kg (231 lb 7.7 oz) 106 kg (233 lb 11 oz) 90.311 kg (199 lb 1.6 oz)     Exam: General: Alert and awake, oriented x3, not in any acute distress. HEENT: anicteric sclera, pupils reactive to light and accommodation, EOMI CVS: S1-S2 clear, no murmur rubs or gallops Chest: clear to auscultation bilaterally, no wheezing, rales or rhonchi Abdomen: soft nontender, nondistended, normal bowel sounds, no organomegaly Extremities: no cyanosis, clubbing or edema noted bilaterally Neuro: Cranial nerves II-XII intact, no focal neurological deficits  Data Reviewed: Basic Metabolic Panel:  Recent Labs Lab 05/27/14 1548 05/28/14 0612 05/29/14 0440  NA 137 142 141  K 4.1 3.2* 4.2  CL 97 103 104  CO2 22 28 24   GLUCOSE 171* 113* 122*  BUN 26* 24* 16  CREATININE 0.58 0.58 0.56  CALCIUM 9.5 9.1 9.2   Liver Function Tests:  Recent Labs Lab 05/27/14 1548 05/28/14 0612 05/29/14 0440  AST 28 15 14   ALT 50 32 24  ALKPHOS 82 66 64  BILITOT 0.3 0.3 0.3  PROT 6.9 5.8* 5.8*  ALBUMIN 3.3* 2.8* 2.7*   No results for input(s): LIPASE, AMYLASE in the last 168 hours. No results for input(s): AMMONIA in the last 168 hours. CBC:  Recent Labs Lab 05/27/14 1548 05/28/14 0612 05/29/14 0440  WBC 16.4* 12.3* 10.6*  NEUTROABS 13.6* 8.7* 7.0  HGB 12.9* 11.4* 11.0*  HCT 40.6 36.3* 35.5*  MCV 86.2 86.4 87.2  PLT 321 223 215   Cardiac Enzymes: No results for input(s): CKTOTAL, CKMB, CKMBINDEX, TROPONINI in the last 168 hours. BNP (last 3 results)  Recent Labs  11/02/13 1257 11/03/13 0517  PROBNP 139.9 146.3   CBG: No results for input(s): GLUCAP in the last 168  hours.  Micro Recent Results (from the past 240 hour(s))  Culture, blood (routine x 2)     Status: None (Preliminary result)   Collection Time: 06/26/2014  9:30 PM  Result Value Ref Range Status   Specimen Description BLOOD ARM LEFT  Final   Special Requests BOTTLES DRAWN AEROBIC AND ANAEROBIC 10CC  Final   Culture  Setup Time   Final    05/28/2014 03:09 Performed at [facility]     Culture   Final           BLOOD CULTURE RECEIVED NO GROWTH TO DATE CULTURE WILL BE HELD FOR 5 DAYS BEFORE ISSUING A FINAL NEGATIVE REPORT Performed at [facility]    Report Status PENDING  Incomplete  Culture, blood (routine x 2)     Status: None (Preliminary result)   Collection Time: 06/26/2014  9:40 PM  Result Value Ref Range Status   Specimen Description BLOOD HAND LEFT  Final   Special Requests BOTTLES DRAWN AEROBIC AND ANAEROBIC 10CC  Final   Culture  Setup Time   Final    05/28/2014 03:09 Performed at [facility]    Culture   Final           BLOOD CULTURE RECEIVED NO GROWTH TO DATE CULTURE WILL BE HELD FOR 5 DAYS BEFORE ISSUING A FINAL NEGATIVE REPORT Performed at [facility]    Report Status PENDING  Incomplete  MRSA PCR Screening     Status: None   Collection Time: 06-26-14 10:51 PM  Result Value Ref Range Status   MRSA by PCR NEGATIVE NEGATIVE Final    Comment:        The GeneXpert MRSA Assay (FDA approved for NASAL specimens only), is one component of a comprehensive MRSA colonization surveillance program. It is not intended to diagnose MRSA infection nor to guide or monitor treatment for MRSA infections.      Studies: Dg Foot Complete Right  26-Jun-2014   CLINICAL DATA:  Ulcer on the distal right great toe and heel. Concern for possible osteomyelitis. Wounds have been present for 2 months. Numbness in the foot. No diabetes.  EXAM: RIGHT FOOT COMPLETE - 3+ VIEW  COMPARISON:  09/05/2006  FINDINGS: There is no evidence of fracture or dislocation. Small plantar and Achilles spurs are present. Soft tissues are unremarkable.  IMPRESSION: No evidence for acute  abnormality.   Electronically Signed   By: Shon Hale M.D.   On: 06-26-14 19:55    Scheduled Meds: . aspirin  325 mg Oral Daily  . atorvastatin  40 mg Oral q1800  . clopidogrel  75 mg Oral Daily  . feeding supplement (ENSURE COMPLETE)  237 mL Oral BID BM  . finasteride  5 mg Oral  Daily  . fluconazole  100 mg Oral Daily  . heparin  5,000 Units Subcutaneous 3 times per day  . lisinopril  2.5 mg Oral Daily  . metoprolol tartrate  12.5 mg Oral BID  . mupirocin ointment   Nasal BID  . pantoprazole  40 mg Oral Daily  . piperacillin-tazobactam (ZOSYN)  IV  3.375 g Intravenous Q8H  . polyethylene glycol  17 g Oral Daily  . potassium chloride SA  20 mEq Oral Daily  . sertraline  25 mg Oral Daily  . sodium chloride  3 mL Intravenous Q12H  . torsemide  20 mg Oral Daily  . vancomycin  1,000 mg Intravenous Q8H   Continuous Infusions: . sodium chloride 10 mL/hr at  05/28/14 1028       Time spent: 35 minutes    [facility]  [facility] Pager [phone] If 7PM-7AM, please contact night-coverage at [email or web address], password [facility] 05/29/2014, 10:24 AM  LOS: 2 days

## 2014-05-30 DIAGNOSIS — L89159 Pressure ulcer of sacral region, unspecified stage: Secondary | ICD-10-CM | POA: Diagnosis present

## 2014-05-30 LAB — COMPREHENSIVE METABOLIC PANEL
ALK PHOS: 61 U/L (ref 39–117)
ALT: 18 U/L (ref 0–53)
AST: 12 U/L (ref 0–37)
Albumin: 2.6 g/dL — ABNORMAL LOW (ref 3.5–5.2)
Anion gap: 11 (ref 5–15)
BUN: 13 mg/dL (ref 6–23)
CHLORIDE: 101 meq/L (ref 96–112)
CO2: 24 meq/L (ref 19–32)
Calcium: 9.3 mg/dL (ref 8.4–10.5)
Creatinine, Ser: 0.61 mg/dL (ref 0.50–1.35)
GLUCOSE: 110 mg/dL — AB (ref 70–99)
POTASSIUM: 3.9 meq/L (ref 3.7–5.3)
SODIUM: 136 meq/L — AB (ref 137–147)
Total Bilirubin: 0.3 mg/dL (ref 0.3–1.2)
Total Protein: 5.8 g/dL — ABNORMAL LOW (ref 6.0–8.3)

## 2014-05-30 LAB — CBC WITH DIFFERENTIAL/PLATELET
BASOS PCT: 0 % (ref 0–1)
Basophils Absolute: 0 10*3/uL (ref 0.0–0.1)
EOS PCT: 3 % (ref 0–5)
Eosinophils Absolute: 0.4 10*3/uL (ref 0.0–0.7)
HCT: 33.2 % — ABNORMAL LOW (ref 39.0–52.0)
Hemoglobin: 10.5 g/dL — ABNORMAL LOW (ref 13.0–17.0)
Lymphocytes Relative: 18 % (ref 12–46)
Lymphs Abs: 2.1 10*3/uL (ref 0.7–4.0)
MCH: 27.3 pg (ref 26.0–34.0)
MCHC: 31.6 g/dL (ref 30.0–36.0)
MCV: 86.2 fL (ref 78.0–100.0)
Monocytes Absolute: 1.1 10*3/uL — ABNORMAL HIGH (ref 0.1–1.0)
Monocytes Relative: 9 % (ref 3–12)
Neutro Abs: 8.3 10*3/uL — ABNORMAL HIGH (ref 1.7–7.7)
Neutrophils Relative %: 70 % (ref 43–77)
PLATELETS: 213 10*3/uL (ref 150–400)
RBC: 3.85 MIL/uL — ABNORMAL LOW (ref 4.22–5.81)
RDW: 14.7 % (ref 11.5–15.5)
WBC: 11.9 10*3/uL — ABNORMAL HIGH (ref 4.0–10.5)

## 2014-05-30 LAB — URINE CULTURE: Colony Count: 50000

## 2014-05-30 NOTE — Progress Notes (Signed)
MD [facility] notified that patient BP 97/63 Pulse 108. Orders to give 10AM Lopressor and hold 10AM Lisinopril at this time. Will continue to monitor.

## 2014-05-30 NOTE — Progress Notes (Signed)
[facility] PROGRESS NOTE   Dylan Nelson GGE:[id number] DOB: 20-Mar-1934 DOA: 05/27/2014 PCP: Alonza Bogus, MD  HPI/Subjective: Denies any complaints overnight, no fever or chills.  Assessment/Plan: Principal Problem:   Osteomyelitis Active Problems:   HTN (hypertension)   Neuropathy   Diastolic dysfunction    Right great toe cellulitis Patient was following as outpatient with the wound clinic. Presented because of worsening. Started on IV vancomycin and Zosyn after blood cultures obtained. MRI of the right foot showed no evidence of osteomyelitis or abscess Per orthopedics likely to be evaluated as outpatient, patient follows with the wound clinic as outpatient. Continue current antibiotics, likely to be discharged in a.m. back to the nursing home  CAD status post MI Patient had non-STEMI in August 2015. Currently on aspirin and Plavix, baseline grade 1 diastolic dysfunction on echo in May 2015. Currently no active chest pain, continue regimen. No active symptoms of CAD or CHF, continue current medications.  Hypertension Stable, patient is euvolemic, continue home medications.  Sacral decubitus ulcer Patient seen by [facility] team, who recommended air mattress and Diflucan as well as moisture barrier.  History of neuropathy Patient has CIDP, chronic inflammatory demyelinating polyneuropathy. Patient is wheelchair bound. PT/OT to evaluate and treat.  Code Status: Full code Family Communication: Plan discussed with the patient. Disposition Plan: Remains inpatient   Consultants:  Xu  Procedures:  None  Antibiotics:  Vancomycin and Zosyn.   Objective: Filed Vitals:   05/30/14 1005  BP: 97/63  Pulse: 108  Temp:   Resp:     Intake/Output Summary (Last 24 hours) at 05/30/14 1152 Last data filed at 05/30/14 1120  Gross per 24 hour  Intake 1816.67 ml  Output   1600 ml  Net 216.67 ml   Filed Weights   05/28/14 0718 05/29/14 0500 05/30/14 0550    Weight: 106 kg (233 lb 11 oz) 90.311 kg (199 lb 1.6 oz) 87.8 kg (193 lb 9 oz)    Exam: General: Alert and awake, oriented x3, not in any acute distress. HEENT: anicteric sclera, pupils reactive to light and accommodation, EOMI CVS: S1-S2 clear, no murmur rubs or gallops Chest: clear to auscultation bilaterally, no wheezing, rales or rhonchi Abdomen: soft nontender, nondistended, normal bowel sounds, no organomegaly Extremities: no cyanosis, clubbing or edema noted bilaterally Neuro: Cranial nerves II-XII intact, no focal neurological deficits  Data Reviewed: Basic Metabolic Panel:  Recent Labs Lab 05/27/14 1548 05/28/14 0612 05/29/14 0440 05/30/14 0506  NA 137 142 141 136*  K 4.1 3.2* 4.2 3.9  CL 97 103 104 101  CO2 22 28 24 24   GLUCOSE 171* 113* 122* 110*  BUN 26* 24* 16 13  CREATININE 0.58 0.58 0.56 0.61  CALCIUM 9.5 9.1 9.2 9.3   Liver Function Tests:  Recent Labs Lab 05/27/14 1548 05/28/14 0612 05/29/14 0440 05/30/14 0506  AST 28 15 14 12   ALT 50 32 24 18  ALKPHOS 82 66 64 61  BILITOT 0.3 0.3 0.3 0.3  PROT 6.9 5.8* 5.8* 5.8*  ALBUMIN 3.3* 2.8* 2.7* 2.6*   No results for input(s): LIPASE, AMYLASE in the last 168 hours. No results for input(s): AMMONIA in the last 168 hours. CBC:  Recent Labs Lab 05/27/14 1548 05/28/14 0612 05/29/14 0440 05/30/14 0506  WBC 16.4* 12.3* 10.6* 11.9*  NEUTROABS 13.6* 8.7* 7.0 8.3*  HGB 12.9* 11.4* 11.0* 10.5*  HCT 40.6 36.3* 35.5* 33.2*  MCV 86.2 86.4 87.2 86.2  PLT 321 223 215 213   Cardiac Enzymes: No results  for input(s): CKTOTAL, CKMB, CKMBINDEX, TROPONINI in the last 168 hours. BNP (last 3 results)  Recent Labs  11/02/13 1257 11/03/13 0517  PROBNP 139.9 146.3   CBG: No results for input(s): GLUCAP in the last 168 hours.  Micro Recent Results (from the past 240 hour(s))  Culture, blood (routine x 2)     Status: None (Preliminary result)   Collection Time: 05/27/14  9:30 PM  Result Value Ref Range  Status   Specimen Description BLOOD ARM LEFT  Final   Special Requests BOTTLES DRAWN AEROBIC AND ANAEROBIC 10CC  Final   Culture  Setup Time   Final    05/28/2014 03:09 Performed at [facility]    Culture   Final           BLOOD CULTURE RECEIVED NO GROWTH TO DATE CULTURE WILL BE HELD FOR 5 DAYS BEFORE ISSUING A FINAL NEGATIVE REPORT Performed at [facility]    Report Status PENDING  Incomplete  Culture, blood (routine x 2)     Status: None (Preliminary result)   Collection Time: 05/27/14  9:40 PM  Result Value Ref Range Status   Specimen Description BLOOD HAND LEFT  Final   Special Requests BOTTLES DRAWN AEROBIC AND ANAEROBIC 10CC  Final   Culture  Setup Time   Final    05/28/2014 03:09 Performed at [facility]    Culture   Final           BLOOD CULTURE RECEIVED NO GROWTH TO DATE CULTURE WILL BE HELD FOR 5 DAYS BEFORE ISSUING A FINAL NEGATIVE REPORT Performed at [facility]    Report Status PENDING  Incomplete  MRSA PCR Screening     Status: None   Collection Time: 05/27/14 10:51 PM  Result Value Ref Range Status   MRSA by PCR NEGATIVE NEGATIVE Final    Comment:        The GeneXpert MRSA Assay (FDA approved for NASAL specimens only), is one component of a comprehensive MRSA colonization surveillance program. It is not intended to diagnose MRSA infection nor to guide or monitor treatment for MRSA infections.      Studies: Mr Foot Right W Contrast  05/29/2014   CLINICAL DATA:  Right great toe and heel ulcers. History of neuropathy. Initial encounter.  EXAM: MRI OF THE RIGHT FOREFOOT WITH CONTRAST  TECHNIQUE: Multiplanar, multisequence MR imaging was performed following the administration of intravenous contrast.  CONTRAST:  58mL MULTIHANCE GADOBENATE DIMEGLUMINE 529 MG/ML IV SOLN  COMPARISON:  Right foot radiographs 05/27/2014 and 09/05/2006.  FINDINGS: Examination includes the entire foot. There is heterogeneous fat saturation in the  distal forefoot. No cortical destruction, marrow edema or suspicious marrow enhancement identified.  There is generalized muscular fatty replacement. Dorsal subcutaneous edema is present within the forefoot. There is edema and soft tissue enhancement within the tarsal sinus and around the distal fibula. No focal fluid collection or soft tissue ulceration apparent. There is no large ankle joint effusion. The talar dome and tibial plafond appear normal.  The major ankle ligaments and tendons appear intact.  IMPRESSION: 1. No evidence of right foot osteomyelitis or soft tissue abscess. 2. Edema and enhancement within the tarsal sinus and around the distal fibula suspicious for synovitis. 3. Underlying generalized muscular atrophy.   Electronically Signed   By: Camie Patience M.D.   On: 05/29/2014 13:15    Scheduled Meds: . aspirin  325 mg Oral Daily  . atorvastatin  40 mg Oral q1800  .  clopidogrel  75 mg Oral Daily  . feeding supplement (ENSURE COMPLETE)  237 mL Oral BID BM  . finasteride  5 mg Oral Daily  . fluconazole  100 mg Oral Daily  . heparin  5,000 Units Subcutaneous 3 times per day  . lisinopril  2.5 mg Oral Daily  . metoprolol tartrate  12.5 mg Oral BID  . mupirocin ointment   Nasal BID  . pantoprazole  40 mg Oral Daily  . piperacillin-tazobactam (ZOSYN)  IV  3.375 g Intravenous Q8H  . polyethylene glycol  17 g Oral Daily  . potassium chloride SA  20 mEq Oral Daily  . sertraline  25 mg Oral Daily  . sodium chloride  3 mL Intravenous Q12H  . torsemide  20 mg Oral Daily  . vancomycin  1,000 mg Intravenous Q8H   Continuous Infusions: . sodium chloride 10 mL/hr at 05/30/14 0356       Time spent: 35 minutes    [facility]  [facility] Pager [phone] If 7PM-7AM, please contact night-coverage at [email or web address], password [facility] 05/30/2014, 11:52 AM  LOS: 3 days

## 2014-05-30 NOTE — Progress Notes (Signed)
MD notified due to acute change in patient status. Vital signs and labs are listed below.  MD notified(1st page) Time of 1st page:   11:22 Responding MD:  Hartford Poli Time MD responded: 11:22 MD response: MD stated aware  Vital Signs Filed Vitals:   05/30/14 0029 05/30/14 0550 05/30/14 0555 05/30/14 1005  BP: 113/66  101/57 97/63  Pulse: 85  89 108  Temp: 97.8 F (36.6 C)  98.5 F (36.9 C)   TempSrc: Oral  Oral   Resp: 18  13   Height:      Weight:  87.8 kg (193 lb 9 oz)    SpO2: 95%  97%      Lab Results WBC  Date/Time Value Ref Range Status  05/30/2014 05:06 AM 11.9* 4.0 - 10.5 K/uL Final  05/29/2014 04:40 AM 10.6* 4.0 - 10.5 K/uL Final  05/28/2014 06:12 AM 12.3* 4.0 - 10.5 K/uL Final   NEUTROPHILS RELATIVE %  Date/Time Value Ref Range Status  05/30/2014 05:06 AM 70 43 - 77 % Final  05/29/2014 04:40 AM 67 43 - 77 % Final  05/28/2014 06:12 AM 71 43 - 77 % Final   No results found for: PCO2ART LACTIC ACID, VENOUS  Date/Time Value Ref Range Status  02/09/2014 03:41 PM 1.2 0.5 - 2.2 mmol/L Final   No results found for: PCO2VEN   Teren Franckowiak L, RN 05/30/2014, 11:22 AM

## 2014-05-30 NOTE — Progress Notes (Signed)
ANTIBIOTIC CONSULT NOTE - FOLLOW UP  Pharmacy Consult for Vancomycin and Zosyn Indication: Rule-out Osteomyelitis  No Known Allergies  Patient Measurements: Height: 6\' 1"  (185.4 cm) Weight: 193 lb 9 oz (87.8 kg) IBW/kg (Calculated) : 79.9  Vital Signs: Temp: 98.5 F (36.9 C) (12/13 0555) Temp Source: Oral (12/13 0555) BP: 97/63 mmHg (12/13 1005) Pulse Rate: 108 (12/13 1005) Intake/Output from previous day: 12/12 0701 - 12/13 0700 In: 1576.7 [P.O.:610; I.V.:216.7; IV Piggyback:750] Out: 1630 [Urine:1630] Intake/Output from this shift: Total I/O In: 480 [P.O.:480] Out: -   Labs:  Recent Labs  05/28/14 0612 05/29/14 0440 05/30/14 0506  WBC 12.3* 10.6* 11.9*  HGB 11.4* 11.0* 10.5*  PLT 223 215 213  CREATININE 0.58 0.56 0.61   Estimated Creatinine Clearance: 86 mL/min (by C-G formula based on Cr of 0.61). No results for input(s): VANCOTROUGH, VANCOPEAK, VANCORANDOM, GENTTROUGH, GENTPEAK, GENTRANDOM, TOBRATROUGH, TOBRAPEAK, TOBRARND, AMIKACINPEAK, AMIKACINTROU, AMIKACIN in the last 72 hours.   Microbiology: Recent Results (from the past 720 hour(s))  Culture, blood (routine x 2)     Status: None (Preliminary result)   Collection Time: 05/27/14  9:30 PM  Result Value Ref Range Status   Specimen Description BLOOD ARM LEFT  Final   Special Requests BOTTLES DRAWN AEROBIC AND ANAEROBIC 10CC  Final   Culture  Setup Time   Final    05/28/2014 03:09 Performed at [facility]    Culture   Final           BLOOD CULTURE RECEIVED NO GROWTH TO DATE CULTURE WILL BE HELD FOR 5 DAYS BEFORE ISSUING A FINAL NEGATIVE REPORT Performed at [facility]    Report Status PENDING  Incomplete  Culture, blood (routine x 2)     Status: None (Preliminary result)   Collection Time: 05/27/14  9:40 PM  Result Value Ref Range Status   Specimen Description BLOOD HAND LEFT  Final   Special Requests BOTTLES DRAWN AEROBIC AND ANAEROBIC 10CC  Final   Culture  Setup Time   Final   05/28/2014 03:09 Performed at [facility]    Culture   Final           BLOOD CULTURE RECEIVED NO GROWTH TO DATE CULTURE WILL BE HELD FOR 5 DAYS BEFORE ISSUING A FINAL NEGATIVE REPORT Performed at [facility]    Report Status PENDING  Incomplete  MRSA PCR Screening     Status: None   Collection Time: 05/27/14 10:51 PM  Result Value Ref Range Status   MRSA by PCR NEGATIVE NEGATIVE Final    Comment:        The GeneXpert MRSA Assay (FDA approved for NASAL specimens only), is one component of a comprehensive MRSA colonization surveillance program. It is not intended to diagnose MRSA infection nor to guide or monitor treatment for MRSA infections.     Anti-infectives    Start     Dose/Rate Route Frequency Ordered Stop   05/28/14 1300  fluconazole (DIFLUCAN) tablet 100 mg     100 mg Oral Daily 05/28/14 1119     05/28/14 0400  piperacillin-tazobactam (ZOSYN) IVPB 3.375 g     3.375 g12.5 mL/hr over 240 Minutes Intravenous Every 8 hours 05/27/14 2143     05/27/14 2200  vancomycin (VANCOCIN) IVPB 1000 mg/200 mL premix     1,000 mg200 mL/hr over 60 Minutes Intravenous Every 8 hours 05/27/14 2143     05/27/14 2145  piperacillin-tazobactam (ZOSYN) IVPB 3.375 g     3.375 g100  mL/hr over 30 Minutes Intravenous  Once 05/27/14 2143 05/27/14 2231   05/27/14 2130  vancomycin (VANCOCIN) 1,500 mg in sodium chloride 0.9 % 500 mL IVPB  Status:  Discontinued     1,500 mg250 mL/hr over 120 Minutes Intravenous STAT 05/27/14 2042 05/27/14 2143   05/27/14 2045  cefTRIAXone (ROCEPHIN) 2 g in dextrose 5 % 50 mL IVPB     2 g100 mL/hr over 30 Minutes Intravenous  Once 05/27/14 2042 05/27/14 2146      Assessment: 75 yoM on Vancomycin and Zosyn for right great toe wound, rule-out osteomyelitis.  12/12 MRI showed no evidence of osteomyelitis or soft tissue abscess. SCr remains stable at 0.61, urine output stable, WBC trending down. Blood cultures negative and urine culture is pending.  He  was started on Fluconazole 100 mg daily for sacral ulcer on 12/11.  Per MD's note, patient will likely be discharged to nursing home on 12/14.  Goal of Therapy:  Vancomycin trough level 15-20 mcg/ml  Plan:  -Continue vanc 1 gm IV q8hrs  -Continue zosyn 3.375 gm IV q8hr -Continue Fluconazole 100 mg q24h -Follow-up cultures, monitor renal function, and check VT if clinically indicated  Theron Arista, PharmD Clinical Pharmacist - Resident Pager: [phone] 12/13/20152:04 PM

## 2014-05-30 NOTE — Plan of Care (Signed)
Problem: Phase I Progression Outcomes Goal: Pain controlled with appropriate interventions Outcome: Progressing Pt denies pain most of the time

## 2014-05-31 DIAGNOSIS — L89152 Pressure ulcer of sacral region, stage 2: Secondary | ICD-10-CM

## 2014-05-31 DIAGNOSIS — G6181 Chronic inflammatory demyelinating polyneuritis: Secondary | ICD-10-CM

## 2014-05-31 LAB — CBC WITH DIFFERENTIAL/PLATELET
Basophils Absolute: 0 10*3/uL (ref 0.0–0.1)
Basophils Relative: 0 % (ref 0–1)
Eosinophils Absolute: 0.3 10*3/uL (ref 0.0–0.7)
Eosinophils Relative: 3 % (ref 0–5)
HCT: 33.4 % — ABNORMAL LOW (ref 39.0–52.0)
HEMOGLOBIN: 10.5 g/dL — AB (ref 13.0–17.0)
LYMPHS ABS: 2.2 10*3/uL (ref 0.7–4.0)
Lymphocytes Relative: 20 % (ref 12–46)
MCH: 27.2 pg (ref 26.0–34.0)
MCHC: 31.4 g/dL (ref 30.0–36.0)
MCV: 86.5 fL (ref 78.0–100.0)
MONOS PCT: 9 % (ref 3–12)
Monocytes Absolute: 1 10*3/uL (ref 0.1–1.0)
NEUTROS ABS: 7.4 10*3/uL (ref 1.7–7.7)
NEUTROS PCT: 68 % (ref 43–77)
Platelets: 214 10*3/uL (ref 150–400)
RBC: 3.86 MIL/uL — ABNORMAL LOW (ref 4.22–5.81)
RDW: 14.6 % (ref 11.5–15.5)
WBC: 11 10*3/uL — ABNORMAL HIGH (ref 4.0–10.5)

## 2014-05-31 LAB — COMPREHENSIVE METABOLIC PANEL
ALBUMIN: 2.7 g/dL — AB (ref 3.5–5.2)
ALT: 15 U/L (ref 0–53)
ANION GAP: 11 (ref 5–15)
AST: 12 U/L (ref 0–37)
Alkaline Phosphatase: 65 U/L (ref 39–117)
BUN: 11 mg/dL (ref 6–23)
CO2: 27 mEq/L (ref 19–32)
Calcium: 9.3 mg/dL (ref 8.4–10.5)
Chloride: 100 mEq/L (ref 96–112)
Creatinine, Ser: 0.57 mg/dL (ref 0.50–1.35)
GFR calc non Af Amer: >90 mL/min (ref >90)
GLUCOSE: 118 mg/dL — AB (ref 70–99)
Potassium: 3.8 mEq/L (ref 3.7–5.3)
Sodium: 138 mEq/L (ref 137–147)
Total Bilirubin: 0.3 mg/dL (ref 0.3–1.2)
Total Protein: 6.2 g/dL (ref 6.0–8.3)

## 2014-05-31 LAB — VANCOMYCIN, TROUGH: Vancomycin Tr: 34.7 ug/mL (ref 10.0–20.0)

## 2014-05-31 MED ORDER — FLUCONAZOLE 100 MG PO TABS
100.0000 mg | ORAL_TABLET | Freq: Every day | ORAL | Status: DC
Start: 2014-05-31 — End: 2014-06-23

## 2014-05-31 MED ORDER — HYDROCODONE-ACETAMINOPHEN 5-325 MG PO TABS: 1.0000 | ORAL_TABLET | ORAL | Status: AC | PRN

## 2014-05-31 MED ORDER — VANCOMYCIN HCL IN DEXTROSE 1-5 GM/200ML-% IV SOLN
1000.0000 mg | Freq: Once | INTRAVENOUS | Status: DC
  Filled 2014-05-31: qty 200

## 2014-05-31 MED ORDER — CEPHALEXIN 500 MG PO CAPS: 500.0000 mg | ORAL_CAPSULE | Freq: Three times a day (TID) | ORAL | Status: DC

## 2014-05-31 MED ORDER — DOXYCYCLINE HYCLATE 100 MG PO TABS: 100.0000 mg | ORAL_TABLET | Freq: Two times a day (BID) | ORAL | Status: DC

## 2014-05-31 MED ORDER — LORAZEPAM 0.5 MG PO TABS: 0.5000 mg | ORAL_TABLET | Freq: Four times a day (QID) | ORAL | Status: AC | PRN

## 2014-05-31 NOTE — Clinical Social Work Placement (Signed)
Clinical Social Work Department CLINICAL SOCIAL WORK PLACEMENT NOTE 05/31/2014  Patient:  Dylan Nelson, Dylan Nelson  Account Number:  [id number] Admit date:  05/27/2014  Clinical Social Worker:  Kemper Durie, [place]  Date/time:  05/31/2014 04:37 PM  Clinical Social Work is seeking post-discharge placement for this patient at the following level of care:   [place]   (*CSW will update this form in Epic as items are completed)   05/31/2014  Patient/family provided with [facility] Department of Clinical Social Work's list of facilities offering this level of care within the geographic area requested by the patient (or if unable, by the patient's family).  05/31/2014  Patient/family informed of their freedom to choose among providers that offer the needed level of care, that participate in Medicare, Medicaid or managed care program needed by the patient, have an available bed and are willing to accept the patient.  05/31/2014  Patient/family informed of [facility]' ownership interest in [facility], as well as of the fact that they are under no obligation to receive care at this facility.  PASARR submitted to EDS on  PASARR number received on   FL2 transmitted to all facilities in geographic area requested by pt/family on  05/31/2014 FL2 transmitted to all facilities within larger geographic area on 05/31/2014  Patient informed that his/her managed care company has contracts with or will negotiate with  certain facilities, including the following:     Patient/family informed of bed offers received:  05/31/2014 Patient chooses bed at [facility] Physician recommends and patient chooses bed at    Patient to be transferred to [place] on  05/31/2014 Patient to be transferred to facility by Ambulance Patient and family notified of transfer on 05/31/2014 Name of family member notified:  Joycelyn Schmid and Rodena Piety  The following  physician request were entered in Epic:   Additional Comments:    Per MD patient ready for DC to [facility], patient, patient's family, and facility notified of DC. RN given number for report. DC packet on chart. [facility] transport requested for patient (service request ID: [id number]). CSW signing off.

## 2014-05-31 NOTE — Evaluation (Signed)
Physical Therapy Evaluation Patient Details Name: Dylan Nelson MRN: [id number] DOB: 01/29/1934 Today's Date: 05/31/2014   History of Present Illness  :This is a 80 y.o. year old male with significant past medical history of HTN, CAD s/p NSTEMI, Grade 1 diastolic dysfunction,  CIDP  presenting with R foot osteomyelitis. Pt resident of local SNF. Pt has had >2-3 months of R great toe erythema, swelling, pain, drainage. Has been managed by SNF wound care. Still with worsening sxs. Was seen by Dr. Bridgett Larsson w/ vascular surgery today at request of family. Had palpable/adequate distal blood flow. Recommending inpt admission w/ eval by ortho.  Testing came back negative for osteo.  Clinical Impression  Pt admitted with/for worsening R great toe erythema.  Pt currently limited functionally due to the problems listed. ( See problems list.)   Pt will benefit from PT to maximize function and safety in order to get ready for next venue listed below.     Follow Up Recommendations SNF    Equipment Recommendations  None recommended by PT    Recommendations for Other Services       Precautions / Restrictions Restrictions Weight Bearing Restrictions: No      Mobility  Bed Mobility Overal bed mobility: Needs Assistance Bed Mobility: Supine to Sit;Sit to Supine     Supine to sit: Max assist (2 persons would be helpful) Sit to supine: Max assist (2 person assist helpful)   General bed mobility comments: significant truncal assist and help to scoot to EOB  Transfers Overall transfer level: Needs assistance Equipment used:  (sliding board) Transfers: Lateral/Scoot Transfers          Lateral/Scoot Transfers: Max assist;With Tax inspector transfer comment: pt unable to effectively transition forward past his knees to unweight his rear end making transfers difficult and needing significant lateral assist.  Ambulation/Gait                Stairs            Wheelchair Mobility     Modified Rankin (Stroke Patients Only)       Balance Overall balance assessment: Needs assistance Sitting-balance support: Single extremity supported;Bilateral upper extremity supported Sitting balance-Leahy Scale: Poor Sitting balance - Comments: tends to list backward with little challenge of bed or board.                                     Pertinent Vitals/Pain Pain Assessment: No/denies pain    Home Living Family/patient expects to be discharged to:: Skilled nursing facility                 Additional Comments: Uses sliding board for transfers    Prior Function Level of Independence: Needs assistance   Gait / Transfers Assistance Needed: Pt is non-ambulatory.  Pt able self propel W/C for short distance, though is mainly pushed in W/C.  Pt is able to navigate with personal electric scooter.  Pt uses a slideboard for transfers.   ADL's / Homemaking Assistance Needed: assist with sponge baths from wife  Comments: Pt has been residing at [facility], receiving PT services, and was recently discharged from OT services.     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-  weak proximally more than distally   Lower Extremity Assessment: Generalized weakness;RLE deficits/detail;LLE deficits/detail RLE Deficits / Details: grossly 3-to 3/5, df/pf abscent LLE Deficits / Details: grossly 3- to 3/5, df/pf abscent     Communication   Communication: No difficulties  Cognition Arousal/Alertness: Awake/alert Behavior During Therapy: WFL for tasks assessed/performed Overall Cognitive Status: Within Functional Limits for tasks assessed                      General Comments      Exercises        Assessment/Plan    PT Assessment Patient needs continued PT services   PT Diagnosis Generalized weakness   PT Problem List Decreased strength;Decreased activity tolerance;Decreased balance;Decreased mobility;Decreased coordination;Decreased knowledge of use of DME  PT Treatment Interventions DME instruction;Functional mobility training;Therapeutic activities;Therapeutic exercise;Balance training;Patient/family education   PT Goals (Current goals can be found in the Care Plan section) Acute Rehab PT Goals Patient Stated Goal: Get stronger and able to move easier to the w/c PT Goal Formulation: With patient Time For Goal Achievement: 06/14/14 Potential to Achieve Goals: Fair    Frequency Min 2X/week   Barriers to discharge        Co-evaluation               End of Session   Activity Tolerance: Patient tolerated treatment well Patient left: in bed;with call bell/phone within reach Nurse Communication: Mobility status;Other (comment) (need for sliding board.)         Time: 1000-1048 PT Time Calculation (min) (ACUTE ONLY): 48 min   Charges:   PT Evaluation $Initial PT Evaluation Tier I: 1 Procedure PT Treatments $Therapeutic Exercise: 38-52 mins   PT G Codes:          Ariday Brinker, Tessie Fass 05/31/2014, 11:17 AM 05/31/2014  Donnella Sham, [place] [phone]  (pager)

## 2014-05-31 NOTE — Progress Notes (Signed)
OT Cancellation Note  Patient Details Name: Dylan Nelson MRN: [id number] DOB: Jun 14, 1934   Cancelled Treatment:    Reason Eval/Treat Not Completed: OT screened. Pt is Medicare and current D/C plan is SNF (pt from SNF and going back). No apparent immediate acute care OT needs, therefore will defer OT to SNF. If OT eval is needed please call Acute Rehab Dept. at [phone] or text page OT at [phone].    Benito Mccreedy OTR/L [phone] 05/31/2014, 11:45 AM

## 2014-05-31 NOTE — Progress Notes (Signed)
Medicare Important Message given?  YES (If response is "NO", the following Medicare IM given date fields will be blank) Date Medicare IM given:  05/31/14 Medicare IM given by:  Tomi Bamberger

## 2014-05-31 NOTE — Progress Notes (Signed)
Pt's family picked up pt's wheelchair. Ranelle Oyster, RN

## 2014-05-31 NOTE — Plan of Care (Signed)
Problem: Phase III Progression Outcomes Goal: Voiding independently Outcome: Adequate for Discharge Foley cath in - chronic use Goal: Foley discontinued Outcome: Not Met (add Reason) Chronic foley cath use

## 2014-05-31 NOTE — Progress Notes (Signed)
[facility] unable to take patient's personal wheelchair with patient to transport to [facility]. Patient requested that his daughter in law Alvin Diffee pick up his wheelchair. Denese Killings notified and she agreed to pick up wheelchair on unit after work at 11 PM. Wheelchair labeled with patient sticker, wiped down and stored in utility room

## 2014-05-31 NOTE — Plan of Care (Signed)
Problem: Phase I Progression Outcomes Goal: Voiding-avoid urinary catheter unless indicated Outcome: Not Met (add Reason) Chronic foley cath use  Problem: Phase II Progression Outcomes Goal: Obtain order to discontinue catheter if appropriate Outcome: Completed/Met Date Met:  05/31/14 MD Elmahi verbal order to keep Foley cath in d/t chronic use

## 2014-05-31 NOTE — Progress Notes (Signed)
CRITICAL VALUE ALERT  Critical value received:  Vancomycin trough 34.7  Date of notification:  05/31/14  Time of notification:  0623  Critical value read back:Yes.    Nurse who received alert:  Charlynn Grimes, RN  MD notified (1st page): Fredirick Maudlin, NP Time of first page:  0631  MD notified (2nd page):  Time of second page:  Responding MD:  Notified via text page.   Time MD responded:   Pharmacist made aware and  readjusted dose and frequency of vancomycin .

## 2014-05-31 NOTE — Discharge Summary (Signed)
Physician Discharge Summary  Dylan Nelson CZY:[id number] DOB: 04-07-1934 DOA: 05/27/2014  PCP: Alonza Bogus, MD  Admit date: 05/27/2014 Discharge date: 05/31/2014  Time spent: 40 minutes  Recommendations for Outpatient Follow-up:  1. Follow-up with primary care physician in one week. 2. Follow-up with the wound clinic. 3. Air mattress and moisture barrier cream for sacral decubitus ulcer.  Discharge Diagnoses:  Principal Problem:   Osteomyelitis Active Problems:   HTN (hypertension)   Neuropathy   CIDP (chronic inflammatory demyelinating polyneuropathy)   Diastolic dysfunction   Sacral decubitus ulcer   Discharge Condition: Stable  Diet recommendation: Heart healthy  Filed Weights   05/29/14 0500 05/30/14 0550 05/31/14 0430  Weight: 90.311 kg (199 lb 1.6 oz) 87.8 kg (193 lb 9 oz) 91.1 kg (200 lb 13.4 oz)    History of present illness:  This is a 80 y.o. year old male with significant past medical history of HTN, CAD s/p NSTEMI, Grade 1 diastolic dysfunction, CIDP presenting with R foot osteomyelitis. Pt resident of local SNF. Pt has had >2-3 months of R great toe erythema, swelling, pain, drainage. Has been managed by SNF wound care. Still with worsening sxs. Was seen by Dr. Bridgett Larsson w/ vascular surgery today at request of family. Had palpable/adequate distal blood flow. Recommending inpt admission w/ eval by ortho.  On presentation, afebrile, HR 90s-110s, resp 10s, BP 70s-130s/50s-100s-mainly in 110s. Satting 94% on RA. WBC 16.4, hgb 12.9, Cr 0.58. R foot xray WNL. ESR 21. Started on vanc and rocephin by EDP. EDP Aline Brochure) spoke with [name] w/ [facility] who is recommending MRI R foot. They will consult in am.   Hospital Course:   Right great toe cellulitis Patient was following as outpatient with the wound clinic.  Sent from the vascular surgery clinic for worsening and not responding to oral antibiotics as outpatient. Started on IV vancomycin and Zosyn after blood  cultures obtained. MRI of the right foot showed no evidence of osteomyelitis or abscess Per orthopedics likely to be evaluated as outpatient, patient follows with the wound clinic as outpatient. Discharge on Keflex and doxycycline for 7 more days.  CAD status post MI Patient had non-STEMI in August 2015. Currently on aspirin and Plavix, baseline grade 1 diastolic dysfunction on echo in May 2015. Currently no active chest pain, continue regimen. No active symptoms of CAD or CHF, continue current medications.  Hypertension Stable, patient is euvolemic, continue home medications.  Sacral decubitus ulcer stage II Patient seen by [facility] team, who recommended air mattress and Diflucan as well as moisture barrier.  History of neuropathy Patient has CIDP, chronic inflammatory demyelinating polyneuropathy. Patient is wheelchair bound. PT/OT to evaluate and treat.  UTI Urinalysis was consistent with UTI at time of admission. Did show yeast only, treated with Diflucan, prescribed for 5 more days and discharged.  Procedures:  None  Consultations:  Orthopedics  Discharge Exam: Filed Vitals:   05/31/14 0602  BP: 120/94  Pulse: 88  Temp: 98.5 F (36.9 C)  Resp: 19   General: Alert and awake, oriented x3, not in any acute distress. HEENT: anicteric sclera, pupils reactive to light and accommodation, EOMI CVS: S1-S2 clear, no murmur rubs or gallops Chest: clear to auscultation bilaterally, no wheezing, rales or rhonchi Abdomen: soft nontender, nondistended, normal bowel sounds, no organomegaly Extremities: no cyanosis, clubbing or edema noted bilaterally Neuro: Cranial nerves II-XII intact, no focal neurological deficits  Discharge Instructions You were cared for by a hospitalist during your hospital stay. If you have any  questions about your discharge medications or the care you received while you were in the hospital after you are discharged, you can call the unit and asked to speak  with the hospitalist on call if the hospitalist that took care of you is not available. Once you are discharged, your primary care physician will handle any further medical issues. Please note that NO REFILLS for any discharge medications will be authorized once you are discharged, as it is imperative that you return to your primary care physician (or establish a relationship with a primary care physician if you do not have one) for your aftercare needs so that they can reassess your need for medications and monitor your lab values.  Discharge Instructions    Diet - low sodium heart healthy    Complete by:  As directed      Increase activity slowly    Complete by:  As directed           Current Discharge Medication List    START taking these medications   Details  doxycycline (VIBRA-TABS) 100 MG tablet Take 1 tablet (100 mg total) by mouth 2 (two) times daily. Qty: 14 tablet, Refills: 0    fluconazole (DIFLUCAN) 100 MG tablet Take 1 tablet (100 mg total) by mouth daily. Qty: 5 tablet, Refills: 0      CONTINUE these medications which have CHANGED   Details  cephALEXin (KEFLEX) 500 MG capsule Take 1 capsule (500 mg total) by mouth 3 (three) times daily. Qty: 21 capsule, Refills: 0    HYDROcodone-acetaminophen (NORCO/VICODIN) 5-325 MG per tablet Take 1 tablet by mouth every 4 (four) hours as needed for moderate pain. Qty: 10 tablet, Refills: 0    LORazepam (ATIVAN) 0.5 MG tablet Take 1 tablet (0.5 mg total) by mouth every 6 (six) hours as needed for anxiety. Qty: 10 tablet, Refills: 0      CONTINUE these medications which have NOT CHANGED   Details  acetaminophen (TYLENOL) 325 MG tablet Take 650 mg by mouth every 6 (six) hours as needed for mild pain or fever.    acidophilus (RISAQUAD) CAPS capsule Take 1 capsule by mouth daily.    Amino Acids-Protein Hydrolys (FEEDING SUPPLEMENT, PRO-STAT SUGAR FREE 64,) LIQD Take 30 mLs by mouth 2 (two) times daily.    aspirin 325 MG tablet  Take 1 tablet (325 mg total) by mouth daily.    atorvastatin (LIPITOR) 40 MG tablet Take 1 tablet (40 mg total) by mouth daily at 6 PM.    clopidogrel (PLAVIX) 75 MG tablet Take 75 mg by mouth daily.    ENSURE (ENSURE) Take 237 mLs by mouth 2 (two) times daily.    finasteride (PROSCAR) 5 MG tablet Take 1 tablet by mouth daily.    lisinopril (PRINIVIL,ZESTRIL) 2.5 MG tablet Take 1 tablet (2.5 mg total) by mouth daily.    metoprolol tartrate (LOPRESSOR) 25 MG tablet Take 12.5 mg by mouth 2 (two) times daily.    omeprazole (PRILOSEC) 20 MG capsule Take 20 mg by mouth daily.    polyethylene glycol (MIRALAX / GLYCOLAX) packet Take 17 g by mouth daily.    potassium chloride SA (K-DUR,KLOR-CON) 20 MEQ tablet Take 20 mEq by mouth daily.     predniSONE (DELTASONE) 10 MG tablet Take 1 tablet (10 mg total) by mouth daily with breakfast.    senna-docusate (SENOKOT-S) 8.6-50 MG per tablet Take 1 tablet by mouth 2 (two) times daily.    sertraline (ZOLOFT) 50 MG tablet Take 25 mg  by mouth daily.    torsemide (DEMADEX) 20 MG tablet Take 1 tablet by mouth daily.    triamcinolone cream (KENALOG) 0.1 % Apply 1 application topically 2 (two) times daily.    ZINC OXIDE, TOPICAL, 10 % CREA Apply 1 application topically 3 (three) times daily. Apply to right buttock topically every shift for sheared area on right buttock until healed.    bisacodyl (DULCOLAX) 10 MG suppository Place 10 mg rectally as needed for moderate constipation.      STOP taking these medications     ciprofloxacin (CIPRO) 500 MG tablet      heparin 5000 UNIT/ML injection        No Known Allergies Follow-up Information    Follow up with HAWKINS,EDWARD L, MD In 1 week.   Specialty:  Pulmonary Disease   Contact information:   [place] [address] [phone]        The results of significant diagnostics from this hospitalization (including imaging, microbiology, ancillary and  laboratory) are listed below for reference.    Significant Diagnostic Studies: Mr Foot Right W Contrast  05/29/2014   CLINICAL DATA:  Right great toe and heel ulcers. History of neuropathy. Initial encounter.  EXAM: MRI OF THE RIGHT FOREFOOT WITH CONTRAST  TECHNIQUE: Multiplanar, multisequence MR imaging was performed following the administration of intravenous contrast.  CONTRAST:  55m MULTIHANCE GADOBENATE DIMEGLUMINE 529 MG/ML IV SOLN  COMPARISON:  Right foot radiographs 05/27/2014 and 09/05/2006.  FINDINGS: Examination includes the entire foot. There is heterogeneous fat saturation in the distal forefoot. No cortical destruction, marrow edema or suspicious marrow enhancement identified.  There is generalized muscular fatty replacement. Dorsal subcutaneous edema is present within the forefoot. There is edema and soft tissue enhancement within the tarsal sinus and around the distal fibula. No focal fluid collection or soft tissue ulceration apparent. There is no large ankle joint effusion. The talar dome and tibial plafond appear normal.  The major ankle ligaments and tendons appear intact.  IMPRESSION: 1. No evidence of right foot osteomyelitis or soft tissue abscess. 2. Edema and enhancement within the tarsal sinus and around the distal fibula suspicious for synovitis. 3. Underlying generalized muscular atrophy.   Electronically Signed   By: BCamie PatienceM.D.   On: 05/29/2014 13:15   Dg Foot Complete Right  05/27/2014   CLINICAL DATA:  Ulcer on the distal right great toe and heel. Concern for possible osteomyelitis. Wounds have been present for 2 months. Numbness in the foot. No diabetes.  EXAM: RIGHT FOOT COMPLETE - 3+ VIEW  COMPARISON:  09/05/2006  FINDINGS: There is no evidence of fracture or dislocation. Small plantar and Achilles spurs are present. Soft tissues are unremarkable.  IMPRESSION: No evidence for acute  abnormality.   Electronically Signed   By: BShon HaleM.D.   On: 05/27/2014 19:55     Microbiology: Recent Results (from the past 240 hour(s))  Culture, blood (routine x 2)     Status: None (Preliminary result)   Collection Time: 05/27/14  9:30 PM  Result Value Ref Range Status   Specimen Description BLOOD ARM LEFT  Final   Special Requests BOTTLES DRAWN AEROBIC AND ANAEROBIC 10CC  Final   Culture  Setup Time   Final    05/28/2014 03:09 [facility]   Culture   Final           BLOOD CULTURE RECEIVED NO GROWTH TO DATE CULTURE WILL BE HELD FOR 5  DAYS BEFORE ISSUING A FINAL NEGATIVE REPORT Performed at [facility]    Report Status PENDING  Incomplete  Culture, blood (routine x 2)     Status: None (Preliminary result)   Collection Time: 05/27/14  9:40 PM  Result Value Ref Range Status   Specimen Description BLOOD HAND LEFT  Final   Special Requests BOTTLES DRAWN AEROBIC AND ANAEROBIC 10CC  Final   Culture  Setup Time   Final    05/28/2014 03:09 Performed at [facility]    Culture   Final           BLOOD CULTURE RECEIVED NO GROWTH TO DATE CULTURE WILL BE HELD FOR 5 DAYS BEFORE ISSUING A FINAL NEGATIVE REPORT Performed at [facility]    Report Status PENDING  Incomplete  MRSA PCR Screening     Status: None   Collection Time: 05/27/14 10:51 PM  Result Value Ref Range Status   MRSA by PCR NEGATIVE NEGATIVE Final    Comment:        The GeneXpert MRSA Assay (FDA approved for NASAL specimens only), is one component of a comprehensive MRSA colonization surveillance program. It is not intended to diagnose MRSA infection nor to guide or monitor treatment for MRSA infections.   Urine culture     Status: None   Collection Time: 05/29/14 10:09 AM  Result Value Ref Range Status   Specimen Description URINE, CATHETERIZED  Final   Special Requests NONE  Final   Culture  Setup Time   Final    05/29/2014 19:15 Performed at [place]   Final    50,000 COLONIES/ML Performed at [place] Performed at [facility]   Final   Report Status 05/30/2014 FINAL  Final     Labs: Basic Metabolic Panel:  Recent Labs Lab 05/27/14 1548 05/28/14 0612 05/29/14 0440 05/30/14 0506 05/31/14 0515  NA 137 142 141 136* 138  K 4.1 3.2* 4.2 3.9 3.8  CL 97 103 104 101 100  CO2 _0 GLUCOSE 171* 113* 122* 110* 118*  BUN 26* 24* _1 CREATININE 0.58 0.58 0.56 0.61 0.57  CALCIUM 9.5 9.1 9.2 9.3 9.3   Liver Function Tests:  Recent Labs Lab 05/27/14 1548 05/28/14 0612 05/29/14 0440 05/30/14 0506 05/31/14 0515  AST _2 ALT 50 32 _3 ALKPHOS 82 66 64 61 65  BILITOT 0.3 0.3 0.3 0.3 0.3  PROT 6.9 5.8* 5.8* 5.8* 6.2  ALBUMIN 3.3* 2.8* 2.7* 2.6* 2.7*   No results for input(s): LIPASE, AMYLASE in the last 168 hours. No results for input(s): AMMONIA in the last 168 hours. CBC:  Recent Labs Lab 05/27/14 1548 05/28/14 0612 05/29/14 0440 05/30/14 0506 05/31/14 0515  WBC 16.4* 12.3* 10.6* 11.9* 11.0*  NEUTROABS 13.6* 8.7* 7.0 8.3* 7.4  HGB 12.9* 11.4* 11.0* 10.5* 10.5*  HCT 40.6 36.3* 35.5* 33.2* 33.4*  MCV 86.2 86.4 87.2 86.2 86.5  PLT 321 223 215 213 214   Cardiac Enzymes: No results for input(s): CKTOTAL, CKMB, CKMBINDEX, TROPONINI in the last 168 hours. BNP: BNP (last 3 results)  Recent Labs  11/02/13 1257 11/03/13 0517  PROBNP 139.9 146.3   CBG: No results for input(s): GLUCAP in the last 168 hours.     Signed:  Yovanny Coats A  [facility] 05/31/2014, 9:02 AM

## 2014-05-31 NOTE — Progress Notes (Signed)
NURSING PROGRESS NOTE  Dylan Nelson [id number] Discharge Data: 05/31/2014 4:44 PM Attending Provider: Verlee Monte, MD ZJI:RCVELFY,BOFBPZ Carlean Jews, MD     [facility] to be D/C'd Skilled nursing facility per MD order. All IV's discontinued with no bleeding noted. All belongings returned to patient for patient to take.  Last Vital Signs:  Blood pressure 107/57, pulse 88, temperature 98.6 F (37 C), temperature source Oral, resp. rate 14, height 6\' 1"  (1.854 m), weight 91.1 kg (200 lb 13.4 oz), SpO2 97 %.  Discharge Medication List   Medication List    STOP taking these medications        ciprofloxacin 500 MG tablet  Commonly known as:  CIPRO     heparin 5000 UNIT/ML injection      TAKE these medications        acetaminophen 325 MG tablet  Commonly known as:  TYLENOL  Take 650 mg by mouth every 6 (six) hours as needed for mild pain or fever.     acidophilus Caps capsule  Take 1 capsule by mouth daily.     aspirin 325 MG tablet  Take 1 tablet (325 mg total) by mouth daily.     atorvastatin 40 MG tablet  Commonly known as:  LIPITOR  Take 1 tablet (40 mg total) by mouth daily at 6 PM.     bisacodyl 10 MG suppository  Commonly known as:  DULCOLAX  Place 10 mg rectally as needed for moderate constipation.     cephALEXin 500 MG capsule  Commonly known as:  KEFLEX  Take 1 capsule (500 mg total) by mouth 3 (three) times daily.     clopidogrel 75 MG tablet  Commonly known as:  PLAVIX  Take 75 mg by mouth daily.     doxycycline 100 MG tablet  Commonly known as:  VIBRA-TABS  Take 1 tablet (100 mg total) by mouth 2 (two) times daily.     ENSURE  Take 237 mLs by mouth 2 (two) times daily.     feeding supplement (PRO-STAT SUGAR FREE 64) Liqd  Take 30 mLs by mouth 2 (two) times daily.     finasteride 5 MG tablet  Commonly known as:  PROSCAR  Take 1 tablet by mouth daily.     fluconazole 100 MG tablet  Commonly known as:  DIFLUCAN  Take 1 tablet (100 mg total) by mouth  daily.     HYDROcodone-acetaminophen 5-325 MG per tablet  Commonly known as:  NORCO/VICODIN  Take 1 tablet by mouth every 4 (four) hours as needed for moderate pain.     lisinopril 2.5 MG tablet  Commonly known as:  PRINIVIL,ZESTRIL  Take 1 tablet (2.5 mg total) by mouth daily.     LORazepam 0.5 MG tablet  Commonly known as:  ATIVAN  Take 1 tablet (0.5 mg total) by mouth every 6 (six) hours as needed for anxiety.     metoprolol tartrate 25 MG tablet  Commonly known as:  LOPRESSOR  Take 12.5 mg by mouth 2 (two) times daily.     omeprazole 20 MG capsule  Commonly known as:  PRILOSEC  Take 20 mg by mouth daily.     polyethylene glycol packet  Commonly known as:  MIRALAX / GLYCOLAX  Take 17 g by mouth daily.     potassium chloride SA 20 MEQ tablet  Commonly known as:  K-DUR,KLOR-CON  Take 20 mEq by mouth daily.     predniSONE 10 MG tablet  Commonly known as:  DELTASONE  Take 1 tablet (10 mg total) by mouth daily with breakfast.     senna-docusate 8.6-50 MG per tablet  Commonly known as:  Senokot-S  Take 1 tablet by mouth 2 (two) times daily.     sertraline 50 MG tablet  Commonly known as:  ZOLOFT  Take 25 mg by mouth daily.     torsemide 20 MG tablet  Commonly known as:  DEMADEX  Take 1 tablet by mouth daily.     triamcinolone cream 0.1 %  Commonly known as:  KENALOG  Apply 1 application topically 2 (two) times daily.     ZINC OXIDE (TOPICAL) 10 % Crea  Apply 1 application topically 3 (three) times daily. Apply to right buttock topically every shift for sheared area on right buttock until healed.         Wallie Renshaw, RN

## 2014-05-31 NOTE — Progress Notes (Signed)
ANTIBIOTIC CONSULT NOTE - FOLLOW UP  Pharmacy Consult for Vancomycin  Indication: R-toe cellulitis  Patient Measurements: Height: 6\' 1"  (185.4 cm) Weight: 200 lb 13.4 oz (91.1 kg) IBW/kg (Calculated) : 79.9  Labs:  Recent Labs  05/29/14 0440 05/30/14 0506  WBC 10.6* 11.9*  HGB 11.0* 10.5*  PLT 215 213  CREATININE 0.56 0.61   Recent Labs  05/31/14 0515  VANCOTROUGH 34.7*    Assessment: SUPRA-therapeutic vancomycin trough, drawn correctly (verified with RN).   Goal of Therapy:  Vancomycin trough level 15-20 mcg/ml  Plan:  -Re-check vancomycin level 12/15 at 0500  Narda Bonds 05/31/2014,6:27 AM

## 2014-05-31 NOTE — Plan of Care (Signed)
Problem: Phase III Progression Outcomes Goal: Activity at appropriate level-compared to baseline (UP IN CHAIR FOR HEMODIALYSIS)  Outcome: Adequate for Discharge Up to wheelchair

## 2014-05-31 NOTE — Clinical Social Work Psychosocial (Signed)
Clinical Social Work Department BRIEF PSYCHOSOCIAL ASSESSMENT 05/31/2014  Patient:  Dylan Nelson, Dylan Nelson     Account Number:  [id number]     Admit date:  05/27/2014  Clinical Social Worker:  Lovey Newcomer  Date/Time:  05/31/2014 11:00 AM  Referred by:  Physician  Date Referred:  05/31/2014 Referred for  SNF Placement   Other Referral:   NA   Interview type:  Patient Other interview type:   Patient and daughter in law Rodena Piety interviewed to complete assessment.    PSYCHOSOCIAL DATA Living Status:  FACILITY Admitted from facility:  [facility] Level of care:  [place] Primary support name:  Rodena Piety and Joycelyn Schmid Primary support relationship to patient:  FAMILY Degree of support available:   Support is strong.    CURRENT CONCERNS Current Concerns  Post-Acute Placement   Other Concerns:   NA    SOCIAL WORK ASSESSMENT / PLAN CSW met with patient at bedside to complete assessment. Patient states that he was admitted from [facility]. Patient seems to be indifferent about which facility he goes to at discharge, but family does NOT want him to return to [facility]. CSW explained SNF search/placement process and anwered questions. Patient and family state that the patient will need a long term care bed at the SNF he discharges too. Patient appeared calm and engaged in assessment.   Assessment/plan status:  Psychosocial Support/Ongoing Assessment of Needs Other assessment/ plan:   Complete Fl2, Fax, PASRR   Information/referral to community resources:   CSW contact information and SNF list given.    PATIENT'S/FAMILY'S RESPONSE TO PLAN OF CARE: Patient and family plan for patient to DC to a different SNF at DC. CSW will assist.       Liz Beach MSW, [place], [place], [phone]

## 2014-06-02 NOTE — Progress Notes (Signed)
CARE MANAGEMENT NOTE 06/02/2014  Patient:  Dylan Nelson, Dylan Nelson   Account Number:  [id number]  Date Initiated:  06/02/2014  Documentation initiated by:  Tomi Bamberger  Subjective/Objective Assessment:   dx r great toe ulcer,? osteo  admit- from [facility]     Action/Plan:   Anticipated DC Date:  05/31/2014   Anticipated DC Plan:  SKILLED NURSING FACILITY  In-house referral  Clinical Social Worker      DC Planning Services  CM consult      Choice offered to / List presented to:             Status of service:  Completed, signed off Medicare Important Message given?  YES (If response is "NO", the following Medicare IM given date fields will be blank) Date Medicare IM given:  06/02/2014 Medicare IM given by:  Tomi Bamberger Date Additional Medicare IM given:   Additional Medicare IM given by:    Discharge Disposition:  [facility]  Per UR Regulation:  Reviewed for med. necessity/level of care/duration of stay  If discussed at [place] of Stay Meetings, dates discussed:    Comments:   06/02/14 Tomi Bamberger RN, BSN  Pt dc to snf.

## 2014-06-03 LAB — CULTURE, BLOOD (ROUTINE X 2)
CULTURE: NO GROWTH
Culture: NO GROWTH

## 2014-06-08 ENCOUNTER — Encounter (HOSPITAL_BASED_OUTPATIENT_CLINIC_OR_DEPARTMENT_OTHER): Payer: Medicare Other | Attending: General Surgery

## 2014-06-08 DIAGNOSIS — G629 Polyneuropathy, unspecified: Secondary | ICD-10-CM | POA: Diagnosis not present

## 2014-06-08 DIAGNOSIS — L97519 Non-pressure chronic ulcer of other part of right foot with unspecified severity: Secondary | ICD-10-CM | POA: Insufficient documentation

## 2014-06-09 NOTE — H&P (Signed)
NAME:  Dylan Nelson, BRAZZEL NO.:  [id number]  MEDICAL RECORD NO.:  [id number]  LOCATION:  FOOT                         FACILITY:  [facility]  PHYSICIAN:  Elesa Hacker, M.D.        DATE OF BIRTH:  1934/04/20  DATE OF ADMISSION:  06/08/2014 DATE OF DISCHARGE:                             HISTORY & PHYSICAL   CHIEF COMPLAINT:  Wound, right great toe.  HISTORY OF PRESENT ILLNESS:  This 80 year old male, is a resident of local skilled nursing facility.  He has had erythema, swelling, pain, and drainage for several months from his right great toe.  He has been seen by Dr. Bridgett Larsson of Vascular Surgery and started to have adequate flow. ABI is 1.0.  PAST MEDICAL HISTORY:  Significant for hypertension, neuropathy, healed sacral decubitus, diastolic dysfunction, coronary artery disease, chronic inflammatory demyelinating polyneuropathy, history of osteomyelitis, right foot.  PAST SURGICAL HISTORY:  Hernia repair and cataract surgery.  SOCIAL HISTORY:  Cigarettes none for 30 years.  Alcohol none.  ALLERGIES:  None.  MEDICATIONS:  Tylenol, aspirin, Lipitor, Dulcolax, Keflex, Plavix, __________ Proscar, Diflucan, Norco, Ativan, lisinopril, Lopressor, Ensure, Prilosec, MiraLAX, prednisone 10 mg daily, __________ Zoloft, Demadex, K-Dur.  REVIEW OF SYSTEMS:  As above.  PHYSICAL EXAMINATION:  VITAL SIGNS:  Temp 98.1, pulse 89, respirations 16, blood pressure 110/50. GENERAL APPEARANCE:  Well developed, well nourished. CHEST:  Clear. HEART:  Regular rhythm. ABDOMEN:  Protuberant, has multiple bruises from what he says a subcutaneous injections.  He has a small umbilical hernia. EXTREMITIES:  Examination of the lower extremities reveals complete footdrop, very marked loss of sensation, both feet.  On the right great toe, there is a 2.5 x 2.5 x 0.1 very superficial wound.  The base is clean after debridement.  PLAN OF TREATMENT:  We will start with silver collagen and toe socks.   I will see him in 7 days.     Elesa Hacker, M.D.     RA/MEDQ  D:  06/08/2014  T:  06/08/2014  Job:  [id number]

## 2014-06-15 ENCOUNTER — Institutional Professional Consult (permissible substitution): Payer: Medicare Other | Admitting: Internal Medicine

## 2014-06-15 DIAGNOSIS — G629 Polyneuropathy, unspecified: Secondary | ICD-10-CM | POA: Diagnosis not present

## 2014-06-15 DIAGNOSIS — L97519 Non-pressure chronic ulcer of other part of right foot with unspecified severity: Secondary | ICD-10-CM | POA: Diagnosis not present

## 2014-06-22 ENCOUNTER — Encounter (HOSPITAL_BASED_OUTPATIENT_CLINIC_OR_DEPARTMENT_OTHER): Payer: Medicare Other | Attending: General Surgery

## 2014-06-22 DIAGNOSIS — L97511 Non-pressure chronic ulcer of other part of right foot limited to breakdown of skin: Secondary | ICD-10-CM | POA: Insufficient documentation

## 2014-06-22 DIAGNOSIS — L97411 Non-pressure chronic ulcer of right heel and midfoot limited to breakdown of skin: Secondary | ICD-10-CM | POA: Insufficient documentation

## 2014-06-23 ENCOUNTER — Ambulatory Visit (INDEPENDENT_AMBULATORY_CARE_PROVIDER_SITE_OTHER): Payer: Medicare Other | Admitting: Internal Medicine

## 2014-06-23 ENCOUNTER — Encounter: Payer: Self-pay | Admitting: Internal Medicine

## 2014-06-23 VITALS — BP 115/64 | HR 93 | Temp 97.9°F

## 2014-06-23 DIAGNOSIS — I10 Essential (primary) hypertension: Secondary | ICD-10-CM

## 2014-06-23 DIAGNOSIS — R0600 Dyspnea, unspecified: Secondary | ICD-10-CM

## 2014-06-23 NOTE — Patient Instructions (Signed)
Stop lisinopril and use diovan 40 mg daily instead  If not better in 4 weeks we need to see you back here.

## 2014-06-23 NOTE — Progress Notes (Signed)
Subjective:     Patient ID: Dylan Nelson, male   DOB: 08-04-1933,   MRN: [id number]  HPI  81 yowm quit smoking 1985 s sequelae and can't walk due to leg weakness / ataxia /peripheral neuropathy then admitted for first time in May 2015 with difficulty walking /leg swelling and has been in institutuions since with last d/c summary :  Admit date: 05/27/2014 Discharge date: 05/31/2014  Discharge Diagnoses:  Principal Problem:  Osteomyelitis   HTN (hypertension)  Neuropathy  CIDP (chronic inflammatory demyelinating polyneuropathy)  Diastolic dysfunction  Sacral decubitus ulcer       History of present illness:  This is a 81 y.o. year old male with significant past medical history of HTN, CAD s/p NSTEMI, Grade 1 diastolic dysfunction, CIDP presenting with R foot osteomyelitis. Pt resident of local SNF. Pt has had >2-3 months of R great toe erythema, swelling, pain, drainage. Has been managed by SNF wound care. Still with worsening sxs. Was seen by Dr. Bridgett Larsson w/ vascular surgery today at request of family. Had palpable/adequate distal blood flow. Recommending inpt admission w/ eval by ortho.  On presentation, afebrile, HR 90s-110s, resp 10s, BP 70s-130s/50s-100s-mainly in 110s. Satting 94% on RA. WBC 16.4, hgb 12.9, Cr 0.58. R foot xray WNL. ESR 21. Started on vanc and rocephin by EDP. EDP Aline Brochure) spoke with [name] w/ [facility] who is recommending MRI R foot. They will consult in am.   Hospital Course:   Right great toe cellulitis Patient was following as outpatient with the wound clinic.  Sent from the vascular surgery clinic for worsening and not responding to oral antibiotics as outpatient. Started on IV vancomycin and Zosyn after blood cultures obtained. MRI of the right foot showed no evidence of osteomyelitis or abscess Per orthopedics likely to be evaluated as outpatient, patient follows with the wound clinic as outpatient. Discharge on Keflex and doxycycline for 7 more  days.  CAD status post MI Patient had non-STEMI in August 2015. Currently on aspirin and Plavix, baseline grade 1 diastolic dysfunction on echo in May 2015. Currently no active chest pain, continue regimen. No active symptoms of CAD or CHF, continue current medications.  Hypertension Stable, patient is euvolemic, continue home medications.  Sacral decubitus ulcer stage II Patient seen by [facility] team, who recommended air mattress and Diflucan as well as moisture barrier.  History of neuropathy Patient has CIDP, chronic inflammatory demyelinating polyneuropathy. Patient is wheelchair bound. PT/OT to evaluate and treat.  UTI Urinalysis was consistent with UTI at time of admission. Did show yeast only, treated with Diflucan, prescribed for 5 more days and discharged.   06/23/2014  First pulmonary eval / [name] re noisy breathing on ACEi   Chief Complaint  Patient presents with  . Advice Only    Referred by Dr. Wylene Simmer; patient is unsure as to why he is here.  Patient says the only thing he can think of is when he does Physical Therapy, he has a hard time breathing.  assoc with intermittent choking sensation/ mild hoarseness and noisy breathing that bother others more than it does pt  No obvious day to day or daytime variabilty or assoc excess mucus  or cp or chest tightness, subjective wheeze overt sinus or hb symptoms. No unusual exp hx or h/o childhood pna/ asthma or knowledge of premature birth.  Sleeping ok without nocturnal  or early am exacerbation  of respiratory  c/o's or need for noct saba. Also denies any obvious fluctuation of symptoms with weather  or environmental changes or other aggravating or alleviating factors except as outlined above   Current Medications, Allergies, Complete Past Medical History, Past Surgical History, Family History, and Social History were reviewed in [employer] record.  ROS  The following are not active complaints unless  bolded sore throat, dysphagia, dental problems, itching, sneezing,  nasal congestion or excess/ purulent secretions, ear ache,   fever, chills, sweats, unintended wt loss, pleuritic or exertional cp, hemoptysis,  orthopnea pnd or leg swelling, presyncope, palpitations, heartburn, abdominal pain, anorexia, nausea, vomiting, diarrhea  or change in bowel or urinary habits, change in stools or urine, dysuria,hematuria,  rash, arthralgias, visual complaints, headache, numbness weakness or ataxia or problems with walking or coordination,  change in mood/affect or memory.            Review of Systems     Objective:   Physical Exam   W/c bound mild pseudowheeze  Wt Readings from Last 3 Encounters:  05/31/14 200 lb 13.4 oz (91.1 kg)  05/27/14 200 lb (90.719 kg)  01/19/14 226 lb 9.6 oz (102.785 kg)    Vital signs reviewed  HEENT: upper denture, lower partial, nl  turbinates, and orophanx. Nl external ear canals without cough reflex   NECK :  without JVD/Nodes/TM/ nl carotid upstrokes bilaterally   LUNGS: no acc muscle use, clear to A and P bilaterally without cough on insp or exp maneuvers   CV:  RRR  no s3 or murmur or increase in P2,  1-2 Plus bilateral LE pitting  edema   ABD:  soft and nontender with nl excursion in the supine position. No bruits or organomegaly, bowel sounds nl  MS:  warm without deformities, calf tenderness, cyanosis or clubbing  SKIN: warm and dry without lesions    NEURO:  alert, approp, no deficits    cxr 02/09/14 No active disease.    Chemistry      Component Value Date/Time   NA 138 05/31/2014 0515   K 3.8 05/31/2014 0515   CL 100 05/31/2014 0515   CO2 27 05/31/2014 0515   BUN 11 05/31/2014 0515   CREATININE 0.57 05/31/2014 0515      Component Value Date/Time   CALCIUM 9.3 05/31/2014 0515   ALKPHOS 65 05/31/2014 0515   AST 12 05/31/2014 0515   ALT 15 05/31/2014 0515   BILITOT 0.3 05/31/2014 0515      Lab Results  Component Value Date    PROBNP 146.3 11/03/2013       Assessment:

## 2014-06-24 DIAGNOSIS — R0600 Dyspnea, unspecified: Secondary | ICD-10-CM | POA: Insufficient documentation

## 2014-06-24 NOTE — Assessment & Plan Note (Signed)

## 2014-06-24 NOTE — Assessment & Plan Note (Signed)
  When respiratory symptoms begin or become refractory well after a patient reports complete smoking cessation,  Especially when this wasn't the case while they were smoking, a red flag is raised based on the work of Dr Kris Mouton which states:  if you quit smoking when your best day FEV1 is still well preserved it is highly unlikely you will progress to severe disease.  That is to say, once the smoking stops,  the symptoms should not suddenly erupt or markedly worsen.  If so, the differential diagnosis should include  obesity/deconditioning,  LPR/Reflux/Aspiration syndromes,  occult CHF, or  especially side effect of medications commonly used in this population, esp ACEi  He is not aware of any problem except for perception of intermittent choking typical of an acei effect and reports others say he's having trouble/ noisy breathing but he's not aware.  In absence of significant sputum production/ wheeze/ noct symptoms this is an acei case until proven otherwise.    rec trial off acei x 6 weeks then return here for further w/u if not bettter, sooner if any clinical deterioration in meantime eg desats, orthopnea

## 2014-06-29 DIAGNOSIS — L97411 Non-pressure chronic ulcer of right heel and midfoot limited to breakdown of skin: Secondary | ICD-10-CM | POA: Diagnosis not present

## 2014-06-29 DIAGNOSIS — L97511 Non-pressure chronic ulcer of other part of right foot limited to breakdown of skin: Secondary | ICD-10-CM | POA: Diagnosis not present

## 2014-07-06 DIAGNOSIS — L97411 Non-pressure chronic ulcer of right heel and midfoot limited to breakdown of skin: Secondary | ICD-10-CM | POA: Diagnosis not present

## 2014-07-06 DIAGNOSIS — L97511 Non-pressure chronic ulcer of other part of right foot limited to breakdown of skin: Secondary | ICD-10-CM | POA: Diagnosis not present

## 2014-07-13 DIAGNOSIS — L97411 Non-pressure chronic ulcer of right heel and midfoot limited to breakdown of skin: Secondary | ICD-10-CM | POA: Diagnosis not present

## 2014-07-13 DIAGNOSIS — L97511 Non-pressure chronic ulcer of other part of right foot limited to breakdown of skin: Secondary | ICD-10-CM | POA: Diagnosis not present

## 2014-07-20 ENCOUNTER — Encounter (HOSPITAL_BASED_OUTPATIENT_CLINIC_OR_DEPARTMENT_OTHER): Payer: Medicare Other | Attending: General Surgery

## 2014-07-20 DIAGNOSIS — L97411 Non-pressure chronic ulcer of right heel and midfoot limited to breakdown of skin: Secondary | ICD-10-CM | POA: Insufficient documentation

## 2014-07-20 DIAGNOSIS — G629 Polyneuropathy, unspecified: Secondary | ICD-10-CM | POA: Diagnosis present

## 2014-07-20 DIAGNOSIS — L97519 Non-pressure chronic ulcer of other part of right foot with unspecified severity: Secondary | ICD-10-CM | POA: Insufficient documentation

## 2014-07-27 DIAGNOSIS — L97519 Non-pressure chronic ulcer of other part of right foot with unspecified severity: Secondary | ICD-10-CM | POA: Diagnosis not present

## 2014-07-27 DIAGNOSIS — L97411 Non-pressure chronic ulcer of right heel and midfoot limited to breakdown of skin: Secondary | ICD-10-CM | POA: Diagnosis not present

## 2014-07-27 DIAGNOSIS — G629 Polyneuropathy, unspecified: Secondary | ICD-10-CM | POA: Diagnosis not present

## 2014-08-03 DIAGNOSIS — G629 Polyneuropathy, unspecified: Secondary | ICD-10-CM | POA: Diagnosis not present

## 2014-08-03 DIAGNOSIS — L97519 Non-pressure chronic ulcer of other part of right foot with unspecified severity: Secondary | ICD-10-CM | POA: Diagnosis not present

## 2014-08-03 DIAGNOSIS — L97411 Non-pressure chronic ulcer of right heel and midfoot limited to breakdown of skin: Secondary | ICD-10-CM | POA: Diagnosis not present

## 2014-08-24 ENCOUNTER — Encounter (HOSPITAL_BASED_OUTPATIENT_CLINIC_OR_DEPARTMENT_OTHER): Payer: Medicare Other | Attending: General Surgery

## 2014-08-24 DIAGNOSIS — L89892 Pressure ulcer of other site, stage 2: Secondary | ICD-10-CM | POA: Insufficient documentation

## 2014-08-24 DIAGNOSIS — G629 Polyneuropathy, unspecified: Secondary | ICD-10-CM | POA: Diagnosis not present

## 2014-09-07 DIAGNOSIS — L89892 Pressure ulcer of other site, stage 2: Secondary | ICD-10-CM | POA: Diagnosis not present

## 2014-09-07 DIAGNOSIS — G629 Polyneuropathy, unspecified: Secondary | ICD-10-CM | POA: Diagnosis not present

## 2014-09-21 ENCOUNTER — Encounter (HOSPITAL_BASED_OUTPATIENT_CLINIC_OR_DEPARTMENT_OTHER): Payer: Medicare Other | Attending: General Surgery

## 2014-09-21 DIAGNOSIS — L97311 Non-pressure chronic ulcer of right ankle limited to breakdown of skin: Secondary | ICD-10-CM | POA: Insufficient documentation

## 2014-09-26 ENCOUNTER — Emergency Department (HOSPITAL_COMMUNITY)
Admission: EM | Admit: 2014-09-26 | Discharge: 2014-09-27 | Disposition: A | Discharge status: Home / Self Care | Payer: Medicare Other | Attending: Emergency Medicine | Admitting: Emergency Medicine

## 2014-09-26 ENCOUNTER — Encounter (HOSPITAL_COMMUNITY): Payer: Self-pay | Admitting: Nurse Practitioner

## 2014-09-26 DIAGNOSIS — Z8739 Personal history of other diseases of the musculoskeletal system and connective tissue: Secondary | ICD-10-CM | POA: Diagnosis not present

## 2014-09-26 DIAGNOSIS — Z7902 Long term (current) use of antithrombotics/antiplatelets: Secondary | ICD-10-CM | POA: Diagnosis not present

## 2014-09-26 DIAGNOSIS — N390 Urinary tract infection, site not specified: Secondary | ICD-10-CM | POA: Insufficient documentation

## 2014-09-26 DIAGNOSIS — T83038A Leakage of other indwelling urethral catheter, initial encounter: Secondary | ICD-10-CM | POA: Insufficient documentation

## 2014-09-26 DIAGNOSIS — Z79899 Other long term (current) drug therapy: Secondary | ICD-10-CM | POA: Insufficient documentation

## 2014-09-26 DIAGNOSIS — Y846 Urinary catheterization as the cause of abnormal reaction of the patient, or of later complication, without mention of misadventure at the time of the procedure: Secondary | ICD-10-CM | POA: Diagnosis not present

## 2014-09-26 DIAGNOSIS — I10 Essential (primary) hypertension: Secondary | ICD-10-CM | POA: Insufficient documentation

## 2014-09-26 DIAGNOSIS — I252 Old myocardial infarction: Secondary | ICD-10-CM | POA: Diagnosis not present

## 2014-09-26 DIAGNOSIS — Z87891 Personal history of nicotine dependence: Secondary | ICD-10-CM | POA: Insufficient documentation

## 2014-09-26 DIAGNOSIS — Z8669 Personal history of other diseases of the nervous system and sense organs: Secondary | ICD-10-CM | POA: Insufficient documentation

## 2014-09-26 DIAGNOSIS — Z7982 Long term (current) use of aspirin: Secondary | ICD-10-CM | POA: Insufficient documentation

## 2014-09-26 DIAGNOSIS — K5900 Constipation, unspecified: Secondary | ICD-10-CM | POA: Diagnosis not present

## 2014-09-26 DIAGNOSIS — F329 Major depressive disorder, single episode, unspecified: Secondary | ICD-10-CM | POA: Diagnosis not present

## 2014-09-26 DIAGNOSIS — N400 Enlarged prostate without lower urinary tract symptoms: Secondary | ICD-10-CM | POA: Insufficient documentation

## 2014-09-26 DIAGNOSIS — T839XXA Unspecified complication of genitourinary prosthetic device, implant and graft, initial encounter: Secondary | ICD-10-CM

## 2014-09-26 LAB — BASIC METABOLIC PANEL
Anion gap: 8 (ref 5–15)
BUN: 16 mg/dL (ref 6–23)
CALCIUM: 9.2 mg/dL (ref 8.4–10.5)
CHLORIDE: 105 mmol/L (ref 96–112)
CO2: 29 mmol/L (ref 19–32)
CREATININE: 0.7 mg/dL (ref 0.50–1.35)
GFR calc Af Amer: >90 mL/min (ref >90)
GFR, EST NON AFRICAN AMERICAN: 87 mL/min — AB (ref >90)
GLUCOSE: 120 mg/dL — AB (ref 70–99)
POTASSIUM: 3.9 mmol/L (ref 3.5–5.1)
Sodium: 142 mmol/L (ref 135–145)

## 2014-09-26 LAB — CBC WITH DIFFERENTIAL/PLATELET
Basophils Absolute: 0 10*3/uL (ref 0.0–0.1)
Basophils Relative: 0 % (ref 0–1)
EOS ABS: 0.1 10*3/uL (ref 0.0–0.7)
Eosinophils Relative: 1 % (ref 0–5)
HEMATOCRIT: 37 % — AB (ref 39.0–52.0)
Hemoglobin: 11.7 g/dL — ABNORMAL LOW (ref 13.0–17.0)
Lymphocytes Relative: 24 % (ref 12–46)
Lymphs Abs: 2.4 10*3/uL (ref 0.7–4.0)
MCH: 27.4 pg (ref 26.0–34.0)
MCHC: 31.6 g/dL (ref 30.0–36.0)
MCV: 86.7 fL (ref 78.0–100.0)
MONO ABS: 0.9 10*3/uL (ref 0.1–1.0)
MONOS PCT: 9 % (ref 3–12)
Neutro Abs: 6.6 10*3/uL (ref 1.7–7.7)
Neutrophils Relative %: 66 % (ref 43–77)
Platelets: 187 10*3/uL (ref 150–400)
RBC: 4.27 MIL/uL (ref 4.22–5.81)
RDW: 15.4 % (ref 11.5–15.5)
WBC: 10.1 10*3/uL (ref 4.0–10.5)

## 2014-09-26 LAB — URINALYSIS, ROUTINE W REFLEX MICROSCOPIC
Bilirubin Urine: NEGATIVE
Glucose, UA: NEGATIVE mg/dL
KETONES UR: NEGATIVE mg/dL
Nitrite: NEGATIVE
Protein, ur: NEGATIVE mg/dL
SPECIFIC GRAVITY, URINE: 1.007 (ref 1.005–1.030)
UROBILINOGEN UA: 0.2 mg/dL (ref 0.0–1.0)
pH: 7.5 (ref 5.0–8.0)

## 2014-09-26 LAB — URINE MICROSCOPIC-ADD ON

## 2014-09-26 LAB — BRAIN NATRIURETIC PEPTIDE: B Natriuretic Peptide: 51 pg/mL (ref 0.0–100.0)

## 2014-09-26 MED ORDER — LIDOCAINE HCL 2 % EX GEL
CUTANEOUS | Status: AC
  Administered 2014-09-26: 10
  Filled 2014-09-26: qty 10

## 2014-09-26 MED ORDER — CEFPODOXIME PROXETIL 200 MG PO TABS
100.0000 mg | ORAL_TABLET | Freq: Once | ORAL | Status: AC
  Administered 2014-09-27: 100 mg via ORAL
  Filled 2014-09-26: qty 1

## 2014-09-26 MED ORDER — CEFPODOXIME PROXETIL 100 MG PO TABS: 100.0000 mg | ORAL_TABLET | Freq: Two times a day (BID) | ORAL | Status: AC

## 2014-09-26 NOTE — ED Notes (Signed)
Pt presents from [place], report of a foley cath leakage problem, not there has been no urine output in the last > 12 hrs. Advised Dr. Jordan Likes who the medics report states is the urologist advised that pt be brought in for further evaluation and foley reinsertion remarking on hx of urinary stricture.

## 2014-09-26 NOTE — ED Notes (Signed)
Bed: PP89 Expected date:  Expected time:  Means of arrival:  Comments: EMS 81yo M F/C leaking

## 2014-09-26 NOTE — Discharge Instructions (Signed)
Foley Catheter Care °A Foley catheter is a soft, flexible tube that is placed into the bladder to drain urine. A Foley catheter may be inserted if: °· You leak urine or are not able to control when you urinate (urinary incontinence). °· You are not able to urinate when you need to (urinary retention). °· You had prostate surgery or surgery on the genitals. °· You have certain medical conditions, such as multiple sclerosis, dementia, or a spinal cord injury. °If you are going home with a Foley catheter in place, follow the instructions below. °TAKING CARE OF THE CATHETER °1. Wash your hands with soap and water. °2. Using mild soap and warm water on a clean washcloth: °· Clean the area on your body closest to the catheter insertion site using a circular motion, moving away from the catheter. Never wipe toward the catheter because this could sweep bacteria up into the urethra and cause infection. °· Remove all traces of soap. Pat the area dry with a clean towel. For males, reposition the foreskin. °3. Attach the catheter to your leg so there is no tension on the catheter. Use adhesive tape or a leg strap. If you are using adhesive tape, remove any sticky residue left behind by the previous tape you used. °4. Keep the drainage bag below the level of the bladder, but keep it off the floor. °5. Check throughout the day to be sure the catheter is working and urine is draining freely. Make sure the tubing does not become kinked. °6. Do not pull on the catheter or try to remove it. Pulling could damage internal tissues. °TAKING CARE OF THE DRAINAGE BAGS °You will be given two drainage bags to take home. One is a large overnight drainage bag, and the other is a smaller leg bag that fits underneath clothing. You may wear the overnight bag at any time, but you should never wear the smaller leg bag at night. Follow the instructions below for how to empty, change, and clean your drainage bags. °Emptying the Drainage Bag °You must  empty your drainage bag when it is  -½ full or at least 2-3 times a day. °1. Wash your hands with soap and water. °2. Keep the drainage bag below your hips, below the level of your bladder. This stops urine from going back into the tubing and into your bladder. °3. Hold the dirty bag over the toilet or a clean container. °4. Open the pour spout at the bottom of the bag and empty the urine into the toilet or container. Do not let the pour spout touch the toilet, container, or any other surface. Doing so can place bacteria on the bag, which can cause an infection. °5. Clean the pour spout with a gauze pad or cotton ball that has rubbing alcohol on it. °6. Close the pour spout. °7. Attach the bag to your leg with adhesive tape or a leg strap. °8. Wash your hands well. °Changing the Drainage Bag °Change your drainage bag once a month or sooner if it starts to smell bad or look dirty. Below are steps to follow when changing the drainage bag. °1. Wash your hands with soap and water. °2. Pinch off the rubber catheter so that urine does not spill out. °3. Disconnect the catheter tube from the drainage tube at the connection valve. Do not let the tubes touch any surface. °4. Clean the end of the catheter tube with an alcohol wipe. Use a different alcohol wipe to clean the   end of the drainage tube. 5. Connect the catheter tube to the drainage tube of the clean drainage bag. 6. Attach the new bag to the leg with adhesive tape or a leg strap. Avoid attaching the new bag too tightly. 7. Wash your hands well. Cleaning the Drainage Bag 1. Wash your hands with soap and water. 2. Wash the bag in warm, soapy water. 3. Rinse the bag thoroughly with warm water. 4. Fill the bag with a solution of white vinegar and water (1 cup vinegar to 1 qt warm water [.2 L vinegar to 1 L warm water]). Close the bag and soak it for 30 minutes in the solution. 5. Rinse the bag with warm water. 6. Hang the bag to dry with the pour spout open  and hanging downward. 7. Store the clean bag (once it is dry) in a clean plastic bag. 8. Wash your hands well. PREVENTING INFECTION  Wash your hands before and after handling your catheter.  Take showers daily and wash the area where the catheter enters your body. Do not take baths. Replace wet leg straps with dry ones, if this applies.  Do not use powders, sprays, or lotions on the genital area. Only use creams, lotions, or ointments as directed by your caregiver.  For females, wipe from front to back after each bowel movement.  Drink enough fluids to keep your urine clear or pale yellow unless you have a fluid restriction.  Do not let the drainage bag or tubing touch or lie on the floor.  Wear cotton underwear to absorb moisture and to keep your skin drier. SEEK MEDICAL CARE IF:   Your urine is cloudy or smells unusually bad.  Your catheter becomes clogged.  You are not draining urine into the bag or your bladder feels full.  Your catheter starts to leak. SEEK IMMEDIATE MEDICAL CARE IF:   You have pain, swelling, redness, or pus where the catheter enters the body.  You have pain in the abdomen, legs, lower back, or bladder.  You have a fever.  You see blood fill the catheter, or your urine is pink or red.  You have nausea, vomiting, or chills.  Your catheter gets pulled out. MAKE SURE YOU:   Understand these instructions.  Will watch your condition.  Will get help right away if you are not doing well or get worse. Document Released: 06/04/2005 Document Revised: 10/19/2013 Document Reviewed: 05/26/2012 [facility] Patient Information 2015 [place], [place]. This information is not intended to replace advice given to you by your health care provider. Make sure you discuss any questions you have with your health care provider. Urinary Tract Infection Urinary tract infections (UTIs) can develop anywhere along your urinary tract. Your urinary tract is your body's drainage  system for removing wastes and extra water. Your urinary tract includes two kidneys, two ureters, a bladder, and a urethra. Your kidneys are a pair of bean-shaped organs. Each kidney is about the size of your fist. They are located below your ribs, one on each side of your spine. CAUSES Infections are caused by microbes, which are microscopic organisms, including fungi, viruses, and bacteria. These organisms are so small that they can only be seen through a microscope. Bacteria are the microbes that most commonly cause UTIs. SYMPTOMS  Symptoms of UTIs may vary by age and gender of the patient and by the location of the infection. Symptoms in young women typically include a frequent and intense urge to urinate and a painful, burning feeling  in the bladder or urethra during urination. Older women and men are more likely to be tired, shaky, and weak and have muscle aches and abdominal pain. A fever may mean the infection is in your kidneys. Other symptoms of a kidney infection include pain in your back or sides below the ribs, nausea, and vomiting. DIAGNOSIS To diagnose a UTI, your caregiver will ask you about your symptoms. Your caregiver also will ask to provide a urine sample. The urine sample will be tested for bacteria and white blood cells. White blood cells are made by your body to help fight infection. TREATMENT  Typically, UTIs can be treated with medication. Because most UTIs are caused by a bacterial infection, they usually can be treated with the use of antibiotics. The choice of antibiotic and length of treatment depend on your symptoms and the type of bacteria causing your infection. HOME CARE INSTRUCTIONS  If you were prescribed antibiotics, take them exactly as your caregiver instructs you. Finish the medication even if you feel better after you have only taken some of the medication.  Drink enough water and fluids to keep your urine clear or pale yellow.  Avoid caffeine, tea, and  carbonated beverages. They tend to irritate your bladder.  Empty your bladder often. Avoid holding urine for long periods of time.  Empty your bladder before and after sexual intercourse.  After a bowel movement, women should cleanse from front to back. Use each tissue only once. SEEK MEDICAL CARE IF:  7. You have back pain. 8. You develop a fever. 9. Your symptoms do not begin to resolve within 3 days. SEEK IMMEDIATE MEDICAL CARE IF:  9. You have severe back pain or lower abdominal pain. 10. You develop chills. 11. You have nausea or vomiting. 12. You have continued burning or discomfort with urination. MAKE SURE YOU:  8. Understand these instructions. 9. Will watch your condition. 10. Will get help right away if you are not doing well or get worse. Document Released: 03/14/2005 Document Revised: 12/04/2011 Document Reviewed: 07/13/2011 [facility] Patient Information 2015 [place], [place]. This information is not intended to replace advice given to you by your health care provider. Make sure you discuss any questions you have with your health care provider.

## 2014-09-26 NOTE — ED Provider Notes (Signed)
CSN: [id number]     Arrival date & time 09/26/14  2129 History   First MD Initiated Contact with Patient 09/26/14 2138     No chief complaint on file.    (Consider location/radiation/quality/duration/timing/severity/associated sxs/prior Treatment) HPI Comments: Pt presents from [facility], report of a foley cath leakage problem, not there has been no urine output in the last > 12 hrs. Advised Dr. Jordan Likes who the medics report states is the urologist advised that pt be brought in for further evaluation and foley reinsertion remarking on hx of urinary stricture. Pt does not report urethral structure to me, but does report enlarged prostate. THere has been urine leaking out of his urethra.   The history is provided by the patient. No language interpreter was used.    Past Medical History  Diagnosis Date  . Hypertension   . Neuropathy   . Prostate enlargement   . DDD (degenerative disc disease), cervical   . CIDP (chronic inflammatory demyelinating polyneuropathy)   . Gait disorder   . Dysphagia   . NSTEMI (non-ST elevated myocardial infarction)   . Enlarged prostate   . Major depressive disorder   . Constipation    Past Surgical History  Procedure Laterality Date  . Hernia repair     Family History  Problem Relation Age of Onset  . Heart attack Father 64  . Cancer Mother 67   History  Substance Use Topics  . Smoking status: Former Research scientist (life sciences)  . Smokeless tobacco: Not on file  . Alcohol Use: No    Review of Systems  Constitutional: Negative for fever, activity change, appetite change and fatigue.  HENT: Negative for congestion, facial swelling, rhinorrhea and trouble swallowing.   Eyes: Negative for photophobia and pain.  Respiratory: Negative for cough, chest tightness and shortness of breath.   Cardiovascular: Negative for chest pain and leg swelling.  Gastrointestinal: Negative for nausea, vomiting, abdominal pain, diarrhea and constipation.   Endocrine: Negative for polydipsia and polyuria.  Genitourinary: Positive for frequency and difficulty urinating. Negative for dysuria, urgency and decreased urine volume.  Musculoskeletal: Negative for back pain and gait problem.  Skin: Negative for color change, rash and wound.  Allergic/Immunologic: Negative for immunocompromised state.  Neurological: Negative for dizziness, facial asymmetry, speech difficulty, weakness, numbness and headaches.  Psychiatric/Behavioral: Negative for confusion, decreased concentration and agitation.      Allergies  Review of patient's allergies indicates no known allergies.  Home Medications   Prior to Admission medications   Medication Sig Start Date End Date Taking? Authorizing Provider  acidophilus (RISAQUAD) CAPS capsule Take 1 capsule by mouth daily.   Yes Historical Provider, MD  aspirin 325 MG tablet Take 1 tablet (325 mg total) by mouth daily. 01/21/14  Yes Sinda Du, MD  atorvastatin (LIPITOR) 40 MG tablet Take 1 tablet (40 mg total) by mouth daily at 6 PM. 01/21/14  Yes Sinda Du, MD  clopidogrel (PLAVIX) 75 MG tablet Take 75 mg by mouth daily.   Yes Historical Provider, MD  famotidine (PEPCID) 20 MG tablet Take 20 mg by mouth daily.  06/03/14  Yes Historical Provider, MD  finasteride (PROSCAR) 5 MG tablet Take 1 tablet by mouth daily. 08/27/13  Yes Historical Provider, MD  levothyroxine (SYNTHROID, LEVOTHROID) 25 MCG tablet Take 25 mcg by mouth daily before breakfast.  06/09/14  Yes Historical Provider, MD  metoprolol tartrate (LOPRESSOR) 25 MG tablet Take 12.5 mg by mouth 2 (two) times daily.   Yes Historical Provider, MD  oxybutynin (DITROPAN) 5 MG tablet Take 2.5 mg by mouth 3 (three) times daily.   Yes Historical Provider, MD  polyethylene glycol (MIRALAX / GLYCOLAX) packet Take 17 g by mouth daily.   Yes Historical Provider, MD  potassium chloride SA (K-DUR,KLOR-CON) 20 MEQ tablet Take 20 mEq by mouth daily.    Yes Historical  Provider, MD  predniSONE (DELTASONE) 10 MG tablet Take 1 tablet (10 mg total) by mouth daily with breakfast. 11/07/13  Yes Sinda Du, MD  senna-docusate (SENOKOT-S) 8.6-50 MG per tablet Take 1 tablet by mouth 2 (two) times daily.   Yes Historical Provider, MD  sertraline (ZOLOFT) 50 MG tablet Take 25 mg by mouth daily.   Yes Historical Provider, MD  torsemide (DEMADEX) 20 MG tablet Take 20 mg by mouth daily.  08/27/13  Yes Historical Provider, MD  valsartan (DIOVAN) 40 MG tablet Take 40 mg by mouth daily.   Yes Historical Provider, MD  acetaminophen (TYLENOL) 325 MG tablet Take 650 mg by mouth every 6 (six) hours as needed for mild pain or fever.    Historical Provider, MD  cefpodoxime (VANTIN) 100 MG tablet Take 1 tablet (100 mg total) by mouth 2 (two) times daily. 09/26/14   Ernestina Patches, MD  HYDROcodone-acetaminophen (NORCO/VICODIN) 5-325 MG per tablet Take 1 tablet by mouth every 4 (four) hours as needed for moderate pain. 05/31/14   Verlee Monte, MD  lisinopril (PRINIVIL,ZESTRIL) 2.5 MG tablet Take 1 tablet (2.5 mg total) by mouth daily. Patient not taking: Reported on 09/26/2014 01/21/14   Sinda Du, MD  LORazepam (ATIVAN) 0.5 MG tablet Take 1 tablet (0.5 mg total) by mouth every 6 (six) hours as needed for anxiety. 05/31/14   Mutaz Elmahi, MD   BP 146/91 mmHg  Pulse 70  Temp(Src) 98.2 F (36.8 C) (Oral)  Resp 18  SpO2 100% Physical Exam  Constitutional: He is oriented to person, place, and time. He appears well-developed and well-nourished. No distress.  HENT:  Head: Normocephalic and atraumatic.  Mouth/Throat: No oropharyngeal exudate.  Eyes: Pupils are equal, round, and reactive to light.  Neck: Normal range of motion. Neck supple.  Cardiovascular: Normal rate, regular rhythm and normal heart sounds.  Exam reveals no gallop and no friction rub.   No murmur heard. Pulmonary/Chest: Effort normal and breath sounds normal. No respiratory distress. He has no wheezes. He has no  rales.  Abdominal: Soft. Bowel sounds are normal. He exhibits no distension and no mass. There is no tenderness. There is no rebound and no guarding.  Genitourinary:  Straw colored urine from urethra at foley insertion site.   Musculoskeletal: Normal range of motion. He exhibits no edema or tenderness.  Neurological: He is alert and oriented to person, place, and time.  Skin: Skin is warm and dry.  Psychiatric: He has a normal mood and affect.    ED Course  Procedures (including critical care time) Labs Review Labs Reviewed  CBC WITH DIFFERENTIAL/PLATELET - Abnormal; Notable for the following:    Hemoglobin 11.7 (*)    HCT 37.0 (*)    All other components within normal limits  URINALYSIS, ROUTINE W REFLEX MICROSCOPIC - Abnormal; Notable for the following:    APPearance TURBID (*)    Hgb urine dipstick MODERATE (*)    Leukocytes, UA LARGE (*)    All other components within normal limits  BASIC METABOLIC PANEL - Abnormal; Notable for the following:    Glucose, Bld 120 (*)    GFR calc non Af Amer 87 (*)  All other components within normal limits  URINE MICROSCOPIC-ADD ON - Abnormal; Notable for the following:    Bacteria, UA FEW (*)    All other components within normal limits  URINE CULTURE  BRAIN NATRIURETIC PEPTIDE    Imaging Review No results found.   EKG Interpretation None      MDM   Final diagnoses:  Foley catheter problem, initial encounter  UTI (lower urinary tract infection)    Pt is a 81 y.o. male with Pmhx as above who presents with dec UOP from foley with leakage of urine from urethra. He states bladder feels fulls, not painful. Denies n/v, fever, chills. He has had constipation, but passed a hard stool today. On PE, VSS, pt in NAD. He has stra colored urine leaking from around foley. Will attempt to flush, if not working, will replace.   Foley replaced, sediment was present in tubing causing obstruction.   Creatinine normal, urine, likely infected.   Patient will be started on 7 days of vantin and discharged back to his facility.   [facility] evaluation in the Emergency Department is complete. It has been determined that no acute conditions requiring further emergency intervention are present at this time. The patient/guardian have been advised of the diagnosis and plan. We have discussed signs and symptoms that warrant return to the ED, such as changes or worsening in symptoms, worsening pain, fever, inability to fully drained.      Ernestina Patches, MD 09/26/14 [phone]

## 2014-09-27 DIAGNOSIS — T83038A Leakage of other indwelling urethral catheter, initial encounter: Secondary | ICD-10-CM | POA: Diagnosis not present

## 2014-09-29 LAB — URINE CULTURE: Colony Count: >=100000

## 2014-09-30 ENCOUNTER — Telehealth: Payer: Self-pay | Admitting: *Deleted

## 2014-10-05 DIAGNOSIS — L97311 Non-pressure chronic ulcer of right ankle limited to breakdown of skin: Secondary | ICD-10-CM | POA: Diagnosis not present

## 2014-10-19 ENCOUNTER — Encounter (HOSPITAL_BASED_OUTPATIENT_CLINIC_OR_DEPARTMENT_OTHER): Payer: Medicare Other | Attending: General Surgery

## 2014-10-19 DIAGNOSIS — L97511 Non-pressure chronic ulcer of other part of right foot limited to breakdown of skin: Secondary | ICD-10-CM | POA: Diagnosis not present

## 2014-10-19 DIAGNOSIS — G629 Polyneuropathy, unspecified: Secondary | ICD-10-CM | POA: Insufficient documentation

## 2014-12-03 ENCOUNTER — Other Ambulatory Visit: Payer: Self-pay | Admitting: *Deleted

## 2014-12-03 DIAGNOSIS — M79606 Pain in leg, unspecified: Secondary | ICD-10-CM

## 2014-12-15 ENCOUNTER — Encounter: Payer: Self-pay | Admitting: Vascular Surgery

## 2014-12-17 ENCOUNTER — Encounter: Payer: Self-pay | Admitting: Vascular Surgery

## 2014-12-17 ENCOUNTER — Ambulatory Visit (HOSPITAL_COMMUNITY)
Admission: RE | Admit: 2014-12-17 | Discharge: 2014-12-17 | Disposition: A | Discharge status: Home / Self Care | Payer: Medicare Other | Source: Ambulatory Visit | Attending: Vascular Surgery | Admitting: Vascular Surgery

## 2014-12-17 ENCOUNTER — Ambulatory Visit (INDEPENDENT_AMBULATORY_CARE_PROVIDER_SITE_OTHER): Payer: Medicare Other | Admitting: Vascular Surgery

## 2014-12-17 VITALS — BP 114/63 | HR 60 | Temp 98.2°F | Resp 16 | Ht 73.0 in | Wt 220.0 lb

## 2014-12-17 DIAGNOSIS — I70299 Other atherosclerosis of native arteries of extremities, unspecified extremity: Secondary | ICD-10-CM

## 2014-12-17 DIAGNOSIS — I872 Venous insufficiency (chronic) (peripheral): Secondary | ICD-10-CM

## 2014-12-17 DIAGNOSIS — M79604 Pain in right leg: Secondary | ICD-10-CM | POA: Diagnosis not present

## 2014-12-17 DIAGNOSIS — L97909 Non-pressure chronic ulcer of unspecified part of unspecified lower leg with unspecified severity: Secondary | ICD-10-CM | POA: Diagnosis not present

## 2014-12-17 DIAGNOSIS — M79605 Pain in left leg: Secondary | ICD-10-CM | POA: Diagnosis not present

## 2014-12-17 DIAGNOSIS — M79606 Pain in leg, unspecified: Secondary | ICD-10-CM | POA: Diagnosis present

## 2014-12-17 DIAGNOSIS — G629 Polyneuropathy, unspecified: Secondary | ICD-10-CM

## 2014-12-17 NOTE — Progress Notes (Signed)
Established Critical Limb Ischemia Patient  History of Present Illness  Dylan Nelson is a 81 y.o. (06-26-1933) male who presents with chief complaint: non-healing R great toe.  The patient has no rest pain and wounds include: R great toe.  Pt thinks toe looks better.  His BLE swelling is improved reported.  He saw Dr Erlinda Hong for his R great toe.  The patient's treatment regimen currently included: maximal medical management and physical therapy.  He currently is a resident at [facility].  Per family, the patient has been somewhat resistance to recommended therapies at rehab.  The patient's PMH, PSH, SH, and FamHx are unchanged from 05/27/14.  Current Outpatient Prescriptions  Medication Sig Dispense Refill  . acetaminophen (TYLENOL) 325 MG tablet Take 650 mg by mouth every 6 (six) hours as needed for mild pain or fever.    Marland Kitchen acidophilus (RISAQUAD) CAPS capsule Take 1 capsule by mouth daily.    . Amino Acids-Protein Hydrolys (PRO-STAT MAX) LIQD Take by mouth 3 (three) times daily. Takes 30 cc three times daily    . aspirin 325 MG tablet Take 1 tablet (325 mg total) by mouth daily.    Marland Kitchen atorvastatin (LIPITOR) 40 MG tablet Take 1 tablet (40 mg total) by mouth daily at 6 PM.    . bisacodyl (DULCOLAX) 10 MG suppository Place 10 mg rectally daily as needed for moderate constipation.    . cefTRIAXone (ROCEPHIN) 1 G injection Inject 1 g into the muscle daily. Once daily for 7 days.    . clopidogrel (PLAVIX) 75 MG tablet Take 75 mg by mouth daily.    . famotidine (PEPCID) 20 MG tablet Take 20 mg by mouth daily.   98  . finasteride (PROSCAR) 5 MG tablet Take 1 tablet by mouth daily.    Marland Kitchen HYDROcodone-acetaminophen (NORCO/VICODIN) 5-325 MG per tablet Take 1 tablet by mouth every 4 (four) hours as needed for moderate pain. 10 tablet 0  . levothyroxine (SYNTHROID, LEVOTHROID) 25 MCG tablet Take 25 mcg by mouth daily before breakfast.   10  . LORazepam (ATIVAN) 0.5 MG tablet Take 1 tablet (0.5 mg total) by  mouth every 6 (six) hours as needed for anxiety. 10 tablet 0  . methocarbamol (ROBAXIN) 500 MG tablet Take 500 mg by mouth 4 (four) times daily.    . metoprolol tartrate (LOPRESSOR) 25 MG tablet Take 12.5 mg by mouth 2 (two) times daily.    Marland Kitchen oxybutynin (DITROPAN) 5 MG tablet Take 2.5 mg by mouth 3 (three) times daily.    . polyethylene glycol (MIRALAX / GLYCOLAX) packet Take 17 g by mouth daily.    . potassium chloride SA (K-DUR,KLOR-CON) 20 MEQ tablet Take 20 mEq by mouth daily.     . predniSONE (DELTASONE) 10 MG tablet Take 1 tablet (10 mg total) by mouth daily with breakfast.    . pyridoxine (B-6) 200 MG tablet Take 200 mg by mouth 3 (three) times daily.    Marland Kitchen senna-docusate (SENOKOT-S) 8.6-50 MG per tablet Take 1 tablet by mouth 2 (two) times daily.    . sertraline (ZOLOFT) 50 MG tablet Take 25 mg by mouth daily.    Marland Kitchen torsemide (DEMADEX) 20 MG tablet Take 20 mg by mouth daily.     . valsartan (DIOVAN) 40 MG tablet Take 40 mg by mouth daily.    . cefpodoxime (VANTIN) 100 MG tablet Take 1 tablet (100 mg total) by mouth 2 (two) times daily. (Patient not taking: Reported on 12/17/2014) 14 tablet 0  .  lidocaine (XYLOCAINE) 1 % (with preservative) injection     . lisinopril (PRINIVIL,ZESTRIL) 2.5 MG tablet Take 1 tablet (2.5 mg total) by mouth daily. (Patient not taking: Reported on 09/26/2014)    . polyethylene glycol powder (GLYCOLAX/MIRALAX) powder     . sertraline (ZOLOFT) 25 MG tablet      No current facility-administered medications for this visit.    No Known Allergies  On ROS today: no fever or chills, no drainage from any wounds, continue bilateral foot anesthesia and swelling  Physical Examination  Filed Vitals:   12/17/14 1212  BP: 114/63  Pulse: 60  Temp: 98.2 F (36.8 C)  Resp: 16  Height: 6\' 1"  (1.854 m)  Weight: 220 lb (99.791 kg)  SpO2: 97%   Body mass index is 29.03 kg/(m^2).  General: A&O x 3, WDWN, wheelchair bound  Eyes: PERRLA, EOMI  Pulmonary: Sym exp,  good air movt, CTAB, no rales, rhonchi, & wheezing  Cardiac: RRR, Nl S1, S2, no Murmurs, rubs or gallops  Vascular: Vessel Right Left  Radial Palpable Palpable  Brachial Palpable **Palpable  Carotid Palpable, without bruit Palpable, without bruit  Aorta Not palpable N/A  Femoral Palpable Palpable  Popliteal Not palpable Not palpable  PT Not Palpable Not Palpable  DP Faintly Palpable Faintly Palpable   Gastrointestinal: soft, NTND, no G/R, no HSM, no masses, no CVAT B  Musculoskeletal: Generalized weakness, able to move all extremities against gravity 3/5, gait not tested, Extremities without ischemic changes except  Ischemic skin overlying medial 1st PIP and distal toe, prior ulcers have healed R foot, no wounds in L foot, BLE 2-3+ edema  Neurologic: Pain and light touch intact in extremities except bilateral foot anesthesia to mid-calf, Motor exam as listed above  Non-Invasive Vascular Imaging ABI (Date: 12/17/2014)  R: 1.57, DP: bi, PT: tri, TBI: 1.47  L: 1.56, DP: bi, PT: tri, TBI: 1.42   Medical Decision Making  Dylan Nelson is a 81 y.o. male who presents with: ischemic skin R 1st great toe, minimal to no PAD, BLE CVI (C4)    ABI demonstrate intact perfusion all the way down to the toes.  There is some medial calcification as evident with ABI > 1.0.  Subsequently, I suspect his CVI is contributing significantly to his poor wound healing.  I suspect once his swelling is improved, he will heal the skin overlying his R 1st toe.  Would initially try TED hoses, elevation and formal scheduled sequential compression therapy.  If [facility] unable to offer such, may refer patient to a lymphedema clinic for PT (lymphatic massage) and compression (ProFore vs unna boot).  Pt does NOT have lymphedema but their treatment modalities are likely to benefit this patient.  I discussed in depth with the patient the nature of atherosclerosis, and emphasized the importance of maximal  medical management including strict control of blood pressure, blood glucose, and lipid levels, antiplatelet agents, obtaining regular exercise, and cessation of smoking.    The patient is aware that without maximal medical management the underlying atherosclerotic disease process will progress, limiting the benefit of any interventions. The patient is currently on a statin: Lipitor. The patient is currently on an anti-platelet: Plavix.  Thank you for allowing [place] to participate in this patient's care.   Adele Barthel, MD [facility] Office: [phone] Pager: [phone]  12/17/2014, 1:25 PM

## 2014-12-22 ENCOUNTER — Encounter (HOSPITAL_COMMUNITY): Payer: Self-pay | Admitting: Emergency Medicine

## 2014-12-22 ENCOUNTER — Inpatient Hospital Stay (HOSPITAL_COMMUNITY)
Admission: EM | Admit: 2014-12-22 | Discharge: 2015-01-17 | DRG: 871 | Disposition: E | Payer: Medicare Other | Attending: Internal Medicine | Admitting: Internal Medicine

## 2014-12-22 ENCOUNTER — Emergency Department (HOSPITAL_COMMUNITY): Payer: Medicare Other

## 2014-12-22 ENCOUNTER — Encounter: Payer: Self-pay | Admitting: Urology

## 2014-12-22 ENCOUNTER — Inpatient Hospital Stay (HOSPITAL_COMMUNITY): Payer: Medicare Other

## 2014-12-22 DIAGNOSIS — A419 Sepsis, unspecified organism: Secondary | ICD-10-CM | POA: Diagnosis present

## 2014-12-22 DIAGNOSIS — I252 Old myocardial infarction: Secondary | ICD-10-CM

## 2014-12-22 DIAGNOSIS — J9601 Acute respiratory failure with hypoxia: Secondary | ICD-10-CM | POA: Diagnosis present

## 2014-12-22 DIAGNOSIS — N19 Unspecified kidney failure: Secondary | ICD-10-CM

## 2014-12-22 DIAGNOSIS — R6521 Severe sepsis with septic shock: Secondary | ICD-10-CM | POA: Diagnosis present

## 2014-12-22 DIAGNOSIS — G6181 Chronic inflammatory demyelinating polyneuritis: Secondary | ICD-10-CM | POA: Diagnosis present

## 2014-12-22 DIAGNOSIS — R410 Disorientation, unspecified: Secondary | ICD-10-CM | POA: Diagnosis present

## 2014-12-22 DIAGNOSIS — J810 Acute pulmonary edema: Secondary | ICD-10-CM | POA: Diagnosis present

## 2014-12-22 DIAGNOSIS — R34 Anuria and oliguria: Secondary | ICD-10-CM | POA: Diagnosis present

## 2014-12-22 DIAGNOSIS — R1030 Lower abdominal pain, unspecified: Secondary | ICD-10-CM

## 2014-12-22 DIAGNOSIS — I4891 Unspecified atrial fibrillation: Secondary | ICD-10-CM | POA: Diagnosis present

## 2014-12-22 DIAGNOSIS — Z809 Family history of malignant neoplasm, unspecified: Secondary | ICD-10-CM | POA: Diagnosis not present

## 2014-12-22 DIAGNOSIS — E876 Hypokalemia: Secondary | ICD-10-CM | POA: Diagnosis present

## 2014-12-22 DIAGNOSIS — A047 Enterocolitis due to Clostridium difficile: Secondary | ICD-10-CM | POA: Diagnosis present

## 2014-12-22 DIAGNOSIS — I5032 Chronic diastolic (congestive) heart failure: Secondary | ICD-10-CM | POA: Diagnosis present

## 2014-12-22 DIAGNOSIS — Z8249 Family history of ischemic heart disease and other diseases of the circulatory system: Secondary | ICD-10-CM

## 2014-12-22 DIAGNOSIS — L89153 Pressure ulcer of sacral region, stage 3: Secondary | ICD-10-CM | POA: Diagnosis present

## 2014-12-22 DIAGNOSIS — Z87891 Personal history of nicotine dependence: Secondary | ICD-10-CM

## 2014-12-22 DIAGNOSIS — R1310 Dysphagia, unspecified: Secondary | ICD-10-CM | POA: Diagnosis present

## 2014-12-22 DIAGNOSIS — I10 Essential (primary) hypertension: Secondary | ICD-10-CM | POA: Diagnosis present

## 2014-12-22 DIAGNOSIS — I739 Peripheral vascular disease, unspecified: Secondary | ICD-10-CM | POA: Diagnosis present

## 2014-12-22 DIAGNOSIS — Z452 Encounter for adjustment and management of vascular access device: Secondary | ICD-10-CM

## 2014-12-22 DIAGNOSIS — Z993 Dependence on wheelchair: Secondary | ICD-10-CM | POA: Diagnosis not present

## 2014-12-22 DIAGNOSIS — N179 Acute kidney failure, unspecified: Secondary | ICD-10-CM | POA: Diagnosis present

## 2014-12-22 DIAGNOSIS — N400 Enlarged prostate without lower urinary tract symptoms: Secondary | ICD-10-CM | POA: Diagnosis present

## 2014-12-22 DIAGNOSIS — Z515 Encounter for palliative care: Secondary | ICD-10-CM

## 2014-12-22 DIAGNOSIS — T8351XA Infection and inflammatory reaction due to indwelling urinary catheter, initial encounter: Secondary | ICD-10-CM

## 2014-12-22 DIAGNOSIS — G9341 Metabolic encephalopathy: Secondary | ICD-10-CM | POA: Diagnosis present

## 2014-12-22 DIAGNOSIS — N390 Urinary tract infection, site not specified: Secondary | ICD-10-CM | POA: Diagnosis present

## 2014-12-22 DIAGNOSIS — Z66 Do not resuscitate: Secondary | ICD-10-CM | POA: Diagnosis present

## 2014-12-22 DIAGNOSIS — R7989 Other specified abnormal findings of blood chemistry: Secondary | ICD-10-CM

## 2014-12-22 DIAGNOSIS — K529 Noninfective gastroenteritis and colitis, unspecified: Secondary | ICD-10-CM

## 2014-12-22 DIAGNOSIS — R4182 Altered mental status, unspecified: Secondary | ICD-10-CM | POA: Diagnosis present

## 2014-12-22 DIAGNOSIS — L8990 Pressure ulcer of unspecified site, unspecified stage: Secondary | ICD-10-CM | POA: Insufficient documentation

## 2014-12-22 DIAGNOSIS — R6520 Severe sepsis without septic shock: Secondary | ICD-10-CM

## 2014-12-22 LAB — COMPREHENSIVE METABOLIC PANEL
ALBUMIN: 3.1 g/dL — AB (ref 3.5–5.0)
ALT: 39 U/L (ref 17–63)
ALT: 44 U/L (ref 17–63)
ANION GAP: 10 (ref 5–15)
AST: 44 U/L — AB (ref 15–41)
AST: 60 U/L — ABNORMAL HIGH (ref 15–41)
Albumin: 2.5 g/dL — ABNORMAL LOW (ref 3.5–5.0)
Alkaline Phosphatase: 87 U/L (ref 38–126)
Alkaline Phosphatase: 64 U/L (ref 38–126)
Anion gap: 15 (ref 5–15)
BUN: 43 mg/dL — ABNORMAL HIGH (ref 6–20)
BUN: 39 mg/dL — ABNORMAL HIGH (ref 6–20)
CHLORIDE: 99 mmol/L — AB (ref 101–111)
CHLORIDE: 103 mmol/L (ref 101–111)
CO2: 21 mmol/L — ABNORMAL LOW (ref 22–32)
CO2: 22 mmol/L (ref 22–32)
Calcium: 9.3 mg/dL (ref 8.9–10.3)
Calcium: 7.9 mg/dL — ABNORMAL LOW (ref 8.9–10.3)
Creatinine, Ser: 2.05 mg/dL — ABNORMAL HIGH (ref 0.61–1.24)
Creatinine, Ser: 1.41 mg/dL — ABNORMAL HIGH (ref 0.61–1.24)
GFR calc Af Amer: 34 mL/min — ABNORMAL LOW (ref >60)
GFR calc Af Amer: 53 mL/min — ABNORMAL LOW (ref >60)
GFR, EST NON AFRICAN AMERICAN: 29 mL/min — AB (ref >60)
GFR, EST NON AFRICAN AMERICAN: 46 mL/min — AB (ref >60)
Glucose, Bld: 191 mg/dL — ABNORMAL HIGH (ref 65–99)
Glucose, Bld: 95 mg/dL (ref 65–99)
Potassium: 3.7 mmol/L (ref 3.5–5.1)
Potassium: 3.2 mmol/L — ABNORMAL LOW (ref 3.5–5.1)
SODIUM: 135 mmol/L (ref 135–145)
Sodium: 135 mmol/L (ref 135–145)
TOTAL PROTEIN: 7 g/dL (ref 6.5–8.1)
TOTAL PROTEIN: 5.6 g/dL — AB (ref 6.5–8.1)
Total Bilirubin: 0.8 mg/dL (ref 0.3–1.2)
Total Bilirubin: 0.6 mg/dL (ref 0.3–1.2)

## 2014-12-22 LAB — CBC WITH DIFFERENTIAL/PLATELET
BASOS ABS: 0 10*3/uL (ref 0.0–0.1)
Basophils Absolute: 0 10*3/uL (ref 0.0–0.1)
Basophils Relative: 0 % (ref 0–1)
Basophils Relative: 0 % (ref 0–1)
EOS ABS: 0 10*3/uL (ref 0.0–0.7)
EOS PCT: 0 % (ref 0–5)
Eosinophils Absolute: 0 10*3/uL (ref 0.0–0.7)
Eosinophils Relative: 0 % (ref 0–5)
HCT: 41.5 % (ref 39.0–52.0)
HCT: 37 % — ABNORMAL LOW (ref 39.0–52.0)
HEMOGLOBIN: 11.6 g/dL — AB (ref 13.0–17.0)
Hemoglobin: 13.2 g/dL (ref 13.0–17.0)
LYMPHS ABS: 1.3 10*3/uL (ref 0.7–4.0)
LYMPHS PCT: 4 % — AB (ref 12–46)
Lymphocytes Relative: 5 % — ABNORMAL LOW (ref 12–46)
Lymphs Abs: 1.4 10*3/uL (ref 0.7–4.0)
MCH: 28.2 pg (ref 26.0–34.0)
MCH: 27.4 pg (ref 26.0–34.0)
MCHC: 31.8 g/dL (ref 30.0–36.0)
MCHC: 31.4 g/dL (ref 30.0–36.0)
MCV: 88.7 fL (ref 78.0–100.0)
MCV: 87.3 fL (ref 78.0–100.0)
MONO ABS: 2.2 10*3/uL — AB (ref 0.1–1.0)
MONO ABS: 1.5 10*3/uL — AB (ref 0.1–1.0)
Monocytes Relative: 6 % (ref 3–12)
Monocytes Relative: 6 % (ref 3–12)
NEUTROS ABS: 32.4 10*3/uL — AB (ref 1.7–7.7)
Neutro Abs: 22.5 10*3/uL — ABNORMAL HIGH (ref 1.7–7.7)
Neutrophils Relative %: 90 % — ABNORMAL HIGH (ref 43–77)
Neutrophils Relative %: 89 % — ABNORMAL HIGH (ref 43–77)
Platelets: 335 10*3/uL (ref 150–400)
Platelets: 238 10*3/uL (ref 150–400)
RBC: 4.68 MIL/uL (ref 4.22–5.81)
RBC: 4.24 MIL/uL (ref 4.22–5.81)
RDW: 14.8 % (ref 11.5–15.5)
RDW: 14.5 % (ref 11.5–15.5)
WBC: 36 10*3/uL — ABNORMAL HIGH (ref 4.0–10.5)
WBC: 25.3 10*3/uL — AB (ref 4.0–10.5)

## 2014-12-22 LAB — URINALYSIS, ROUTINE W REFLEX MICROSCOPIC
Glucose, UA: NEGATIVE mg/dL
Ketones, ur: NEGATIVE mg/dL
NITRITE: NEGATIVE
PH: 5 (ref 5.0–8.0)
PROTEIN: 100 mg/dL — AB
Specific Gravity, Urine: 1.023 (ref 1.005–1.030)
Urobilinogen, UA: 1 mg/dL (ref 0.0–1.0)

## 2014-12-22 LAB — URINE MICROSCOPIC-ADD ON

## 2014-12-22 LAB — I-STAT CG4 LACTIC ACID, ED
LACTIC ACID, VENOUS: 4.43 mmol/L — AB (ref 0.5–2.0)
LACTIC ACID, VENOUS: 2.11 mmol/L — AB (ref 0.5–2.0)

## 2014-12-22 LAB — APTT: aPTT: 34 seconds (ref 24–37)

## 2014-12-22 LAB — LACTIC ACID, PLASMA: Lactic Acid, Venous: 2.4 mmol/L (ref 0.5–2.0)

## 2014-12-22 LAB — I-STAT TROPONIN, ED: Troponin i, poc: 0.04 ng/mL (ref 0.00–0.08)

## 2014-12-22 LAB — PROCALCITONIN: PROCALCITONIN: 2.37 ng/mL

## 2014-12-22 LAB — PROTIME-INR
INR: 1.35 (ref 0.00–1.49)
Prothrombin Time: 16.8 seconds — ABNORMAL HIGH (ref 11.6–15.2)

## 2014-12-22 MED ORDER — SODIUM CHLORIDE 0.9 % IV BOLUS (SEPSIS)
1000.0000 mL | INTRAVENOUS | Status: AC
  Administered 2014-12-22: 1000 mL via INTRAVENOUS

## 2014-12-22 MED ORDER — SODIUM CHLORIDE 0.9 % IV SOLN
1250.0000 mg | INTRAVENOUS | Status: DC
  Administered 2014-12-23: 1250 mg via INTRAVENOUS
  Filled 2014-12-22: qty 1250

## 2014-12-22 MED ORDER — SODIUM CHLORIDE 0.9 % IV SOLN: 250.0000 mL | INTRAVENOUS | Status: DC | PRN

## 2014-12-22 MED ORDER — POTASSIUM CHLORIDE 10 MEQ/100ML IV SOLN: 10.0000 meq | INTRAVENOUS | Status: DC

## 2014-12-22 MED ORDER — METRONIDAZOLE IN NACL 5-0.79 MG/ML-% IV SOLN
500.0000 mg | Freq: Three times a day (TID) | INTRAVENOUS | Status: DC
  Administered 2014-12-23 – 2014-12-24 (×7): 500 mg via INTRAVENOUS
  Filled 2014-12-22 (×7): qty 100

## 2014-12-22 MED ORDER — HEPARIN SODIUM (PORCINE) 5000 UNIT/ML IJ SOLN
5000.0000 [IU] | Freq: Three times a day (TID) | INTRAMUSCULAR | Status: DC
  Administered 2014-12-23 – 2014-12-24 (×6): 5000 [IU] via SUBCUTANEOUS
  Filled 2014-12-22 (×6): qty 1

## 2014-12-22 MED ORDER — IOHEXOL 300 MG/ML  SOLN
100.0000 mL | Freq: Once | INTRAMUSCULAR | Status: AC | PRN
Start: 2014-12-22 — End: 2014-12-22
  Administered 2014-12-22: 100 mL via INTRAVENOUS

## 2014-12-22 MED ORDER — VANCOMYCIN HCL IN DEXTROSE 1-5 GM/200ML-% IV SOLN
1000.0000 mg | Freq: Once | INTRAVENOUS | Status: AC
  Administered 2014-12-22: 1000 mg via INTRAVENOUS
  Filled 2014-12-22: qty 200

## 2014-12-22 MED ORDER — PIPERACILLIN-TAZOBACTAM 3.375 G IVPB
3.3750 g | Freq: Three times a day (TID) | INTRAVENOUS | Status: DC
  Administered 2014-12-23 – 2014-12-24 (×7): 3.375 g via INTRAVENOUS
  Filled 2014-12-22 (×7): qty 50

## 2014-12-22 MED ORDER — PANTOPRAZOLE SODIUM 40 MG IV SOLR
40.0000 mg | Freq: Every day | INTRAVENOUS | Status: DC
  Administered 2014-12-23 (×2): 40 mg via INTRAVENOUS
  Filled 2014-12-22 (×2): qty 40

## 2014-12-22 MED ORDER — VANCOMYCIN HCL IN DEXTROSE 1-5 GM/200ML-% IV SOLN
1000.0000 mg | Freq: Once | INTRAVENOUS | Status: AC
Start: 2014-12-22 — End: 2014-12-22
  Administered 2014-12-22: 1000 mg via INTRAVENOUS
  Filled 2014-12-22: qty 200

## 2014-12-22 MED ORDER — SODIUM CHLORIDE 0.9 % IV SOLN
INTRAVENOUS | Status: DC
  Administered 2014-12-23: via INTRAVENOUS

## 2014-12-22 MED ORDER — PIPERACILLIN-TAZOBACTAM 3.375 G IVPB 30 MIN
3.3750 g | Freq: Once | INTRAVENOUS | Status: AC
  Administered 2014-12-22: 3.375 g via INTRAVENOUS
  Filled 2014-12-22: qty 50

## 2014-12-22 MED ORDER — SODIUM CHLORIDE 0.9 % IV BOLUS (SEPSIS)
1000.0000 mL | Freq: Once | INTRAVENOUS | Status: AC
  Administered 2014-12-22: 1000 mL via INTRAVENOUS

## 2014-12-22 MED ORDER — POTASSIUM CHLORIDE 10 MEQ/100ML IV SOLN
10.0000 meq | Freq: Once | INTRAVENOUS | Status: AC
Start: 2014-12-22 — End: 2014-12-23
  Administered 2014-12-23: 10 meq via INTRAVENOUS
  Filled 2014-12-22: qty 100

## 2014-12-22 MED ORDER — SODIUM CHLORIDE 0.9 % IV BOLUS (SEPSIS): 1000.0000 mL | Freq: Once | INTRAVENOUS | Status: DC

## 2014-12-22 MED ORDER — IOHEXOL 300 MG/ML  SOLN
25.0000 mL | Freq: Once | INTRAMUSCULAR | Status: AC | PRN
  Administered 2014-12-22: 25 mL via ORAL

## 2014-12-22 NOTE — Progress Notes (Signed)
Patient ID: Dylan Nelson, male   DOB: 1933/11/10, 81 y.o.   MRN: [id number] This patient presented to the clinic today for change of his Foley catheter.  Upon arrival he was noted to be profoundly weak/dizzy.   Patient's temperature is 98.0 F.  His blood pressure was not registering both with the automatic blood pressure cuff as well as manual.   Unable to bear weight.  Confused.  Dysarthric.  Complaining of abdominal pain and distention.  He was pale appearing.  His catheter was draining straw-colored urine.  His abdomen was distended and tender to palpation.  The patient relates that he has not been able to eat in weeks.  For these reasons, we opted to send the patient to the emergency department for a more thorough evaluation.  He is likely dehydrated, he may have a small bowel obstruction, and he may have an infection driving the symptoms.  The patient still needs his Foley catheter changed.  Hopefully, he will have this performed in the emergency room or while in the hospital.

## 2014-12-22 NOTE — ED Provider Notes (Signed)
CSN: [id number]     Arrival date & time 12/23/2014  1502 History   First MD Initiated Contact with Patient 12/30/2014 1606     Chief Complaint  Patient presents with  . Fatigue  . Altered Mental Status     (Consider location/radiation/quality/duration/timing/severity/associated sxs/prior Treatment) Patient is a 81 y.o. male presenting with altered mental status. The history is provided by the patient and a relative.  Altered Mental Status Associated symptoms: abdominal pain, fever, nausea and vomiting   Associated symptoms: no headaches   Abdominal pain:    Pain location: lower abdomen.   Quality:  Dull   Severity:  Mild   Onset quality:  Gradual   Timing:  Constant   Progression:  Unchanged   Chronicity:  New   Past Medical History  Diagnosis Date  . Hypertension   . Neuropathy   . Prostate enlargement   . DDD (degenerative disc disease), cervical   . CIDP (chronic inflammatory demyelinating polyneuropathy)   . Gait disorder   . Dysphagia   . NSTEMI (non-ST elevated myocardial infarction)   . Enlarged prostate   . Major depressive disorder   . Constipation   . UTI (lower urinary tract infection)    Past Surgical History  Procedure Laterality Date  . Hernia repair     Family History  Problem Relation Age of Onset  . Heart attack Father 69  . Cancer Mother 70   History  Substance Use Topics  . Smoking status: Former Research scientist (life sciences)  . Smokeless tobacco: Not on file  . Alcohol Use: No    Review of Systems  Constitutional: Positive for fever.  HENT: Negative for drooling and rhinorrhea.   Eyes: Negative for pain.  Respiratory: Positive for cough. Negative for shortness of breath.   Cardiovascular: Negative for chest pain and leg swelling.  Gastrointestinal: Positive for nausea, vomiting, abdominal pain and diarrhea.  Genitourinary: Negative for dysuria and hematuria.  Musculoskeletal: Negative for gait problem and neck pain.  Skin: Negative for color change.   Neurological: Negative for numbness and headaches.  Hematological: Negative for adenopathy.  Psychiatric/Behavioral: Negative for behavioral problems.  All other systems reviewed and are negative.     Allergies  Review of patient's allergies indicates no known allergies.  Home Medications   Prior to Admission medications   Medication Sig Start Date End Date Taking? Authorizing Provider  acetaminophen (TYLENOL) 325 MG tablet Take 650 mg by mouth every 6 (six) hours as needed for mild pain or fever.   Yes Historical Provider, MD  acidophilus (RISAQUAD) CAPS capsule Take 1 capsule by mouth daily.   Yes Historical Provider, MD  bisacodyl (DULCOLAX) 10 MG suppository Place 10 mg rectally daily as needed for moderate constipation.   Yes Historical Provider, MD  HYDROcodone-acetaminophen (NORCO/VICODIN) 5-325 MG per tablet Take 1 tablet by mouth every 4 (four) hours as needed for moderate pain. 05/31/14  Yes Verlee Monte, MD  LORazepam (ATIVAN) 0.5 MG tablet Take 1 tablet (0.5 mg total) by mouth every 6 (six) hours as needed for anxiety. 05/31/14  Yes Verlee Monte, MD  methocarbamol (ROBAXIN) 500 MG tablet Take 500 mg by mouth 4 (four) times daily.   Yes Historical Provider, MD  metoprolol tartrate (LOPRESSOR) 25 MG tablet Take 12.5 mg by mouth 2 (two) times daily.   Yes Historical Provider, MD  Nutritional Supplements (NUTRITIONAL SHAKE PO) Take 4 oz by mouth 3 (three) times daily.   Yes Historical Provider, MD  oxybutynin (DITROPAN) 5 MG tablet Take 2.5  mg by mouth 3 (three) times daily.   Yes Historical Provider, MD  polyethylene glycol (MIRALAX / GLYCOLAX) packet Take 17 g by mouth daily.   Yes Historical Provider, MD  potassium chloride SA (K-DUR,KLOR-CON) 20 MEQ tablet Take 20 mEq by mouth daily.    Yes Historical Provider, MD  predniSONE (DELTASONE) 10 MG tablet Take 1 tablet (10 mg total) by mouth daily with breakfast. 11/07/13  Yes Sinda Du, MD  pyridoxine (B-6) 200 MG tablet Take  200 mg by mouth 3 (three) times daily.   Yes Historical Provider, MD  senna-docusate (SENOKOT-S) 8.6-50 MG per tablet Take 1 tablet by mouth 2 (two) times daily.   Yes Historical Provider, MD  sertraline (ZOLOFT) 50 MG tablet Take 25 mg by mouth daily.   Yes Historical Provider, MD  torsemide (DEMADEX) 20 MG tablet Take 20 mg by mouth daily.  08/27/13  Yes Historical Provider, MD  valsartan (DIOVAN) 40 MG tablet Take 40 mg by mouth daily.   Yes Historical Provider, MD  aspirin 325 MG tablet Take 1 tablet (325 mg total) by mouth daily. Patient not taking: Reported on 12/29/2014 01/21/14   Sinda Du, MD  cefpodoxime (VANTIN) 100 MG tablet Take 1 tablet (100 mg total) by mouth 2 (two) times daily. Patient not taking: Reported on 12/17/2014 09/26/14   Ernestina Patches, MD  lisinopril (PRINIVIL,ZESTRIL) 2.5 MG tablet Take 1 tablet (2.5 mg total) by mouth daily. Patient not taking: Reported on 09/26/2014 01/21/14   Sinda Du, MD   BP 140/87 mmHg  Pulse 120  Temp(Src) 97.7 F (36.5 C) (Oral)  Resp 20  Ht 6\' 1"  (1.854 m)  Wt 225 lb (102.059 kg)  BMI 29.69 kg/m2  SpO2 96% Physical Exam  Constitutional: He is oriented to person, place, and time. He appears well-developed and well-nourished.  HENT:  Head: Normocephalic and atraumatic.  Right Ear: External ear normal.  Left Ear: External ear normal.  Nose: Nose normal.  Mouth/Throat: No oropharyngeal exudate.  Oral mucous membranes appear dry.  Eyes: Conjunctivae and EOM are normal. Pupils are equal, round, and reactive to light.  Neck: Normal range of motion. Neck supple.  Cardiovascular: Regular rhythm, normal heart sounds and intact distal pulses.  Exam reveals no gallop and no friction rub.   No murmur heard. HR 121  Pulmonary/Chest: Effort normal and breath sounds normal. No respiratory distress. He has no wheezes.  Abdominal: Soft. Bowel sounds are normal. He exhibits no distension. There is tenderness. There is no rebound and no guarding.   Supra umbilical hernia noted without tenderness. Soft on exam.  Mild to moderate suprapubic tenderness.  Musculoskeletal: Normal range of motion. He exhibits no edema or tenderness.  Neurological: He is alert and oriented to person, place, and time.  alert, oriented x3 speech: slight slurring, otherwise normal memory: intact grossly cranial nerves II-XII: intact motor strength: 5/5 in RUE, 1/5 in LUE, LLE, RLE sensation: intact to light touch diffusely    Skin: Skin is warm and dry.  Psychiatric: He has a normal mood and affect. His behavior is normal.  Nursing note and vitals reviewed.   ED Course  Procedures (including critical care time) Labs Review Labs Reviewed  CBC WITH DIFFERENTIAL/PLATELET - Abnormal; Notable for the following:    WBC 36.0 (*)    All other components within normal limits  I-STAT CG4 LACTIC ACID, ED - Abnormal; Notable for the following:    Lactic Acid, Venous 4.43 (*)    All other components within normal  limits  CULTURE, BLOOD (ROUTINE X 2)  CULTURE, BLOOD (ROUTINE X 2)  URINE CULTURE  COMPREHENSIVE METABOLIC PANEL  URINALYSIS, ROUTINE W REFLEX MICROSCOPIC (NOT AT [facility])  Randolm Idol, ED    Imaging Review Dg Chest 2 View  01/11/2015   CLINICAL DATA:  Increase fatigue and disorientation  EXAM: CHEST  2 VIEW  COMPARISON:  Portable chest x-ray of 02/09/2014  FINDINGS: The lungs are not well aerated with mild basilar volume loss. However no focal infiltrate or effusion is seen. Cardiomegaly is stable. There is a large amount of bowel gas primarily within the colon most consistent with ileus.  IMPRESSION: 1. Poor inspiration with bibasilar atelectasis. 2. Gaseous distention of the colon.  Probable ileus.   Electronically Signed   By: Ivar Drape M.D.   On: 12/31/2014 17:09   Ct Head Wo Contrast  12/23/2014   CLINICAL DATA:  Fatigue and altered mental status of uncertain chronicity.  EXAM: CT HEAD WITHOUT CONTRAST  TECHNIQUE: Contiguous axial images  were obtained from the base of the skull through the vertex without intravenous contrast.  COMPARISON:  Head CT scan 01/14/2013.  FINDINGS: A few areas of hypoattenuation are seen in the periventricular and subcortical deep white matter most consistent with chronic microvascular ischemic change. The brain is mildly atrophic. No evidence of acute intracranial abnormality including hemorrhage, infarct, mass lesion, mass effect, midline shift or abnormal extra-axial fluid collection is identified. No hydrocephalus or pneumocephalus. The calvarium is intact. Imaged paranasal sinuses and mastoid air cells are clear.  IMPRESSION: Acute finding.  Mild atrophy and chronic microvascular ischemic change.   Electronically Signed   By: Inge Rise M.D.   On: 01/01/2015 19:37   Ct Abdomen Pelvis W Contrast  12/24/2014   CLINICAL DATA:  Acute onset of worsening fatigue and disorientation. Vomiting and fever. Abdominal pain. Initial encounter.  EXAM: CT ABDOMEN AND PELVIS WITH CONTRAST  TECHNIQUE: Multidetector CT imaging of the abdomen and pelvis was performed using the standard protocol following bolus administration of intravenous contrast.  CONTRAST:  134mL OMNIPAQUE IOHEXOL 300 MG/ML  SOLN  COMPARISON:  Renal ultrasound performed 03/24/2014, and CT of the abdomen and pelvis performed 02/09/2014  FINDINGS: Bibasilar atelectasis or scarring is noted. Diffuse coronary artery calcifications are seen. Calcified nodes are noted about the mediastinum.  A small amount of ascites is noted anterior to the liver.  The liver is unremarkable in appearance. Scattered calcified granulomata are noted within the spleen. The gallbladder is within normal limits. The pancreas and adrenal glands are unremarkable.  Nonspecific perinephric stranding is noted bilaterally. The kidneys are otherwise grossly unremarkable. There is no evidence of hydronephrosis. No renal or ureteral stones are seen.  The small bowel is unremarkable in appearance.  The stomach is within normal limits. No acute vascular abnormalities are seen. Scattered calcification is noted along the abdominal aorta and its branches, including at the proximal renal arteries bilaterally.  The appendix is normal in caliber, without evidence of appendicitis.  Diffuse wall thickening is noted along the sigmoid colon, with wall enhancement and diffuse surrounding soft tissue edema, compatible with acute colitis, either infectious or inflammatory in nature. There is no evidence for ischemia. Trace fluid is noted within the pelvis. There is no evidence of perforation or abscess formation at this time.  A small umbilical hernia is seen, containing only fat, with minimal associated soft tissue stranding.  The bladder is decompressed, with a Foley catheter in place. There is nodular density at the base of  the bladder, reflecting infiltration from the prostate. Malignancy cannot be excluded. This is perhaps slightly more prominent than on the prior study. Bladder wall thickening may reflect chronic inflammation. No inguinal lymphadenopathy is seen.  No acute osseous abnormalities are identified. Multilevel vacuum phenomenon is noted along the lumbar spine. There is mild chronic loss of height at vertebral body L1. Anterior bridging osteophytes are seen along the lower thoracic spine.  IMPRESSION: 1. Diffuse wall thickening along the sigmoid colon, with wall enhancement and diffuse surrounding soft tissue edema, compatible with acute colitis, either infectious or inflammatory in nature. Trace fluid within the pelvis, and small amount of ascites anterior to the liver. No evidence of perforation or abscess formation at this time. 2. Nodular density at the base of the bladder is perhaps slightly more prominent than in 2015, reflecting infiltration from the prostate. Malignancy cannot be excluded. Would correlate with PSA, and evaluate further as deemed clinically appropriate. 3. Bladder wall thickening may  reflect chronic inflammation. 4. Scattered calcification along the abdominal aorta and its branches, including at the proximal renal arteries bilaterally. 5. Small umbilical hernia, containing only fat, with minimal associated soft tissue stranding. 6. Bibasilar atelectasis or scarring noted. 7. Diffuse coronary artery calcifications seen. 8. Mild diffuse degenerative change along the lumbar spine.   Electronically Signed   By: Garald Balding M.D.   On: 01/16/2015 19:52     EKG Interpretation   Date/Time:  Wednesday December 22 2014 15:36:21 EDT Ventricular Rate:  122 PR Interval:  171 QRS Duration: 99 QT Interval:  341 QTC Calculation: 486 R Axis:   27 Text Interpretation:  Sinus tachycardia Left ventricular hypertrophy  Nonspecific T abnormalities, inferior leads Borderline prolonged QT  interval Baseline wander in lead(s) I III aVL Otherwise no significant  change Confirmed by Obinna Ehresman  MD, Yvanna Vidas ([id number]) on 01/07/2015 4:16:42 PM     CRITICAL CARE Performed by: Pamella Pert, S Total critical care time: 45 min Critical care time was exclusive of separately billable procedures and treating other patients. Critical care was necessary to treat or prevent imminent or life-threatening deterioration. Critical care was time spent personally by me on the following activities: development of treatment plan with patient and/or surrogate as well as nursing, discussions with consultants, evaluation of patient's response to treatment, examination of patient, obtaining history from patient or surrogate, ordering and performing treatments and interventions, ordering and review of laboratory studies, ordering and review of radiographic studies, pulse oximetry and re-evaluation of patient's condition.  MDM   Final diagnoses:  Lower abdominal pain  Sepsis, due to unspecified organism  Renal failure  UTI (lower urinary tract infection)  Elevated lactic acid level  Colitis    4:31 PM 81 y.o. male w  hx of HTN, nstemi, UTI's, diastolic dysfunction who presents with multiple complaints. His family reports that he has had lower abdominal pain for the last 3 days. They state that he has had nausea, vomiting, diarrhea, cough. The family states that when they saw him initially today around noon at his facility he had some mild slurring of speech. He was seen at the urology office today and was noted to not be appearing well. He was sent here for further evaluation. He is alert and oriented 3 here. He complains of some lower abdominal pain but denies any chest pain. He is tachycardic, afebrile, mildly tachypneic. Initially with a normal blood pressure which has titrated down into the 60s over 40s. Will initiate sepsis workup with broad-spectrum antibody and IV  fluid. Will get CT of head and abdomen. We'll plan on changing Foley and drawing urine from the Foley.  Family notes a fever of 102 at his facility today.  5:52 PM BP improving w/ IVF. HR has dec to 90's. BP now 94/59.   Hospitalist initially was going to admit patient's blood pressure remains soft. A consult and critical care who will admit the patient. Blood pressures have remained stable in the 34J systolic. Have been as low as 76/40. He has gotten a total of 4 L IV fluid.  Pamella Pert, MD 01/03/2015 2316

## 2014-12-22 NOTE — ED Notes (Signed)
Pt stuck twice to obtain labs without success; previous tech had stuck twice as well; lab notified for blood draw

## 2014-12-22 NOTE — ED Notes (Signed)
Pt returned from radiology.

## 2014-12-22 NOTE — ED Notes (Signed)
Bed: WA06 Expected date:  Expected time:  Means of arrival:  Comments: Triage 1 

## 2014-12-22 NOTE — Progress Notes (Signed)
ANTIBIOTIC CONSULT NOTE - INITIAL  Pharmacy Consult for Vancomycin, Zosyn Indication: Sepsis  No Known Allergies  Patient Measurements: Height: 6\' 1"  (185.4 cm) Weight: 225 lb (102.059 kg) IBW/kg (Calculated) : 79.9  Vital Signs: Temp: 97.7 F (36.5 C) (07/06 1533) Temp Source: Oral (07/06 1533) BP: 140/87 mmHg (07/06 1615) Pulse Rate: 120 (07/06 1615) Intake/Output from previous day:   Intake/Output from this shift:    Labs:  Recent Labs  01/09/2015 1502  WBC 36.0*  HGB 13.2  PLT 335   CrCl cannot be calculated (Patient has no serum creatinine result on file.). No results for input(s): VANCOTROUGH, VANCOPEAK, VANCORANDOM, GENTTROUGH, GENTPEAK, GENTRANDOM, TOBRATROUGH, TOBRAPEAK, TOBRARND, AMIKACINPEAK, AMIKACINTROU, AMIKACIN in the last 72 hours.   Microbiology: No results found for this or any previous visit (from the past 720 hour(s)).  Medical History: Past Medical History  Diagnosis Date  . Hypertension   . Neuropathy   . Prostate enlargement   . DDD (degenerative disc disease), cervical   . CIDP (chronic inflammatory demyelinating polyneuropathy)   . Gait disorder   . Dysphagia   . NSTEMI (non-ST elevated myocardial infarction)   . Enlarged prostate   . Major depressive disorder   . Constipation   . UTI (lower urinary tract infection)     Medications:  Anti-infectives    Start     Dose/Rate Route Frequency Ordered Stop   01/10/2015 1630  piperacillin-tazobactam (ZOSYN) IVPB 3.375 g     3.375 g 100 mL/hr over 30 Minutes Intravenous  Once 12/29/2014 1629     12/27/2014 1630  vancomycin (VANCOCIN) IVPB 1000 mg/200 mL premix     1,000 mg 200 mL/hr over 60 Minutes Intravenous  Once 12/19/2014 1629       Assessment: 81 y.o. male with PMH NSTEMI, HTN, CHF admitted 01/11/2015 after experiencing profound weakness and hypotension in urology clinic today.  From [facility]; facility reports recent episodes of vomiting and fever of 102.  LA elevated to 4.4 in ED.   Also with abdominal distention and tenderness.  Vanc/Zosyn given x 1 in ED; to continue per pharmacy.  Note patient had supratherapeutic vanc trough > 30 at a previous visit with CrCl 86, but this was on 1g IV q8 hr.   Goal of Therapy:  Vancomycin trough level 15-20 mcg/ml  Eradication of infection Appropriate antibiotic dosing for indication and renal function  Plan:  Day 1 antibiotics Vancomycin 1250 mg IV q24 hr.  Normalized CrCl borderline for lower dose; if SCr does not improve by tomorrow, would adjust vanc to 1500 mg q48 hr. Measure vancomycin trough levels at steady state as indicated Zosyn 3.375 g IV given every 8 hrs by 4-hr infusion  Follow clinical course, renal function, culture results as available  Follow for de-escalation of antibiotics and LOT   Reuel Boom, PharmD, BCPS Pager: [phone] 01/08/2015, 4:40 PM

## 2014-12-22 NOTE — ED Notes (Addendum)
Awake. Verbally responsive. A/O x4. Resp even and unlabored. No audible adventitious breath sounds noted. ABC's intact. SR on monitor. IV ABT infused without difficulty. Pt had no adverse reaction noted. F/C patent and intact. Draining yellow urine without difficulty. IV infusing NS without difficulty. Family at bedside. Pt asymptomatic to hypotension episodes.

## 2014-12-22 NOTE — Progress Notes (Signed)
Attempted to call to ED to get report, placed on hold, when RN ready to give report, I can be reached at extension [phone].  Macario Golds

## 2014-12-22 NOTE — ED Notes (Signed)
Pt sent from PMD office and was sent here. Pt reported lower abd pain. Pt reported having hematuria in f/c. F/C due to be changed tomorrow. F/C with dk amber drainage.

## 2014-12-22 NOTE — ED Notes (Signed)
2x unsuccessful lab draw. 

## 2014-12-22 NOTE — H&P (Signed)
PULMONARY / CRITICAL CARE MEDICINE   Name: Dylan Nelson MRN: [id number] DOB: [birth year]    ADMISSION DATE:  01/03/2015 CONSULTATION DATE:  01/10/2015  REFERRING MD :  EDP  CHIEF COMPLAINT:  Sepsis  INITIAL PRESENTATION: 81 year old male presented from urology office with weakness and hypotension. In ED he was found to have findings suspicious for UTI with associated hypotension and elevated lactic. BP was somewhat responsive to IVF resuscitation with MAP borderline. PCCM to see.   STUDIES:  7/6 CT abdomen > diffuse thickening of sigmoid colon, surrounding soft tissues edema, compatible with acute colitis. Trace fluid in pelvis, trace ascites. Nodular density at base of bladder, bladder wall thickening. Scattered calcification of the abdominal aorta. 7/6 CT head > No acute finding. Mild atrophy and microvascular ischemic change.   SIGNIFICANT EVENTS:   HISTORY OF PRESENT ILLNESS:  81 year old male with PMH as below, which includes HTN, CIDP, Grade 1 DD, Recurrent UTI r/t chronic foley, R toe wound with cellulitis, osteomyelitis, and PVD. He is a resident at [facility]. His best health day leaves him confined to wheelchair with lower extremity weakness. He also has issues with dysphagia, which often prevents him from getting adequate PO intake. He was recently treated with Rocephin for UTI at the SNF per family. He has been complaining of lower abdominal pain x3 days assocaited with N/V/D and cough. 7/6 he presented to urology office to have indwelling foley exchanged. While there he was noted to have AMS with profound weakness. Bp would not register on monitors there. He was referred to ED for further eval. In  ED he was noted to to be alert and oriented, but with profound hypotension with systolic pressures in the 60's. He was also tachycardic and tachypneic. These things improved somewhat was IVF resuscitation. Initial lactic 4.5 which has since cleared to just over 2.  MAPs remianed  borderline at around 65. UA is concerning for another UTI. PCCM called for ICU admission.   PAST MEDICAL HISTORY :   has a past medical history of Hypertension; Neuropathy; Prostate enlargement; DDD (degenerative disc disease), cervical; CIDP (chronic inflammatory demyelinating polyneuropathy); Gait disorder; Dysphagia; NSTEMI (non-ST elevated myocardial infarction); Enlarged prostate; Major depressive disorder; Constipation; and UTI (lower urinary tract infection).  has past surgical history that includes Hernia repair. Prior to Admission medications   Medication Sig Start Date End Date Taking? Authorizing Provider  acetaminophen (TYLENOL) 325 MG tablet Take 650 mg by mouth every 6 (six) hours as needed for mild pain or fever.   Yes Historical Provider, MD  acidophilus (RISAQUAD) CAPS capsule Take 1 capsule by mouth daily.   Yes Historical Provider, MD  bisacodyl (DULCOLAX) 10 MG suppository Place 10 mg rectally daily as needed for moderate constipation.   Yes Historical Provider, MD  HYDROcodone-acetaminophen (NORCO/VICODIN) 5-325 MG per tablet Take 1 tablet by mouth every 4 (four) hours as needed for moderate pain. 05/31/14  Yes Verlee Monte, MD  LORazepam (ATIVAN) 0.5 MG tablet Take 1 tablet (0.5 mg total) by mouth every 6 (six) hours as needed for anxiety. 05/31/14  Yes Verlee Monte, MD  methocarbamol (ROBAXIN) 500 MG tablet Take 500 mg by mouth 4 (four) times daily.   Yes Historical Provider, MD  metoprolol tartrate (LOPRESSOR) 25 MG tablet Take 12.5 mg by mouth 2 (two) times daily.   Yes Historical Provider, MD  Nutritional Supplements (NUTRITIONAL SHAKE PO) Take 4 oz by mouth 3 (three) times daily.   Yes Historical Provider, MD  oxybutynin (DITROPAN) 5 MG tablet Take 2.5 mg by mouth 3 (three) times daily.   Yes Historical Provider, MD  polyethylene glycol (MIRALAX / GLYCOLAX) packet Take 17 g by mouth daily.   Yes Historical Provider, MD  potassium chloride SA (K-DUR,KLOR-CON) 20 MEQ tablet  Take 20 mEq by mouth daily.    Yes Historical Provider, MD  predniSONE (DELTASONE) 10 MG tablet Take 1 tablet (10 mg total) by mouth daily with breakfast. 11/07/13  Yes Sinda Du, MD  pyridoxine (B-6) 200 MG tablet Take 200 mg by mouth 3 (three) times daily.   Yes Historical Provider, MD  senna-docusate (SENOKOT-S) 8.6-50 MG per tablet Take 1 tablet by mouth 2 (two) times daily.   Yes Historical Provider, MD  sertraline (ZOLOFT) 50 MG tablet Take 25 mg by mouth daily.   Yes Historical Provider, MD  torsemide (DEMADEX) 20 MG tablet Take 20 mg by mouth daily.  08/27/13  Yes Historical Provider, MD  valsartan (DIOVAN) 40 MG tablet Take 40 mg by mouth daily.   Yes Historical Provider, MD  aspirin 325 MG tablet Take 1 tablet (325 mg total) by mouth daily. Patient not taking: Reported on 12/28/2014 01/21/14   Sinda Du, MD  cefpodoxime (VANTIN) 100 MG tablet Take 1 tablet (100 mg total) by mouth 2 (two) times daily. Patient not taking: Reported on 12/17/2014 09/26/14   Ernestina Patches, MD  lisinopril (PRINIVIL,ZESTRIL) 2.5 MG tablet Take 1 tablet (2.5 mg total) by mouth daily. Patient not taking: Reported on 09/26/2014 01/21/14   Sinda Du, MD   No Known Allergies  FAMILY HISTORY:  has no family status information on file.  SOCIAL HISTORY:  reports that he has quit smoking. He does not have any smokeless tobacco history on file. He reports that he does not drink alcohol or use illicit drugs.  REVIEW OF SYSTEMS:  Unable due to encephalopathy  SUBJECTIVE:   VITAL SIGNS: Temp:  [97.7 F (36.5 C)-99 F (37.2 C)] 97.9 F (36.6 C) (07/06 1936) Pulse Rate:  [92-123] 108 (07/06 2230) Resp:  [20-34] 25 (07/06 2230) BP: (61-140)/(38-98) 88/58 mmHg (07/06 2230) SpO2:  [86 %-100 %] 100 % (07/06 2230) Weight:  [102.059 kg (225 lb)] 102.059 kg (225 lb) (07/06 1533) HEMODYNAMICS:   VENTILATOR SETTINGS:   INTAKE / OUTPUT: No intake or output data in the 24 hours ending 01/16/2015 2257  PHYSICAL  EXAMINATION: General:  Elderly male in NAD  Neuro:  Alert, oriented to self. Non-focal HEENT:  [place]/AT, no JVD noted, PERRL Cardiovascular:  Tachy, regular, non MRG noted Lungs:  Clear bilateral breath sounds, mild tachypnea, unlabored  Abdomen:  Mildly distended, firm, non-tender. Musculoskeletal:  +3 pitting edema to BLE. TED and ortho shoes in place.  Skin:  Wound to R great toe. Sacral decub  LABS:  CBC  Recent Labs Lab 01/01/2015 1502 01/04/2015 2155  WBC 36.0* 25.3*  HGB 13.2 11.6*  HCT 41.5 37.0*  PLT 335 238   Coag's  Recent Labs Lab 01/12/2015 2155  APTT 34  INR 1.35   BMET  Recent Labs Lab 12/26/2014 1502 12/29/2014 2155  NA 135 135  K 3.7 3.2*  CL 99* 103  CO2 21* 22  BUN 43* 39*  CREATININE 2.05* 1.41*  GLUCOSE 191* 95   Electrolytes  Recent Labs Lab 12/29/2014 1502 01/15/2015 2155  CALCIUM 9.3 7.9*   Sepsis Markers  Recent Labs Lab 01/02/2015 1616 01/12/2015 2155 12/24/2014 2206  LATICACIDVEN 4.43*  --  2.11*  PROCALCITON  --  2.37  --  ABG No results for input(s): PHART, PCO2ART, PO2ART in the last 168 hours. Liver Enzymes  Recent Labs Lab 01/06/2015 1502 01/12/2015 2155  AST 44* 60*  ALT 39 44  ALKPHOS 87 64  BILITOT 0.8 0.6  ALBUMIN 3.1* 2.5*   Cardiac Enzymes No results for input(s): TROPONINI, PROBNP in the last 168 hours. Glucose No results for input(s): GLUCAP in the last 168 hours.  Imaging Dg Chest 2 View  12/28/2014   CLINICAL DATA:  Increase fatigue and disorientation  EXAM: CHEST  2 VIEW  COMPARISON:  Portable chest x-ray of 02/09/2014  FINDINGS: The lungs are not well aerated with mild basilar volume loss. However no focal infiltrate or effusion is seen. Cardiomegaly is stable. There is a large amount of bowel gas primarily within the colon most consistent with ileus.  IMPRESSION: 1. Poor inspiration with bibasilar atelectasis. 2. Gaseous distention of the colon.  Probable ileus.   Electronically Signed   By: Ivar Drape M.D.   On:  01/06/2015 17:09   Ct Head Wo Contrast  12/30/2014   CLINICAL DATA:  Fatigue and altered mental status of uncertain chronicity.  EXAM: CT HEAD WITHOUT CONTRAST  TECHNIQUE: Contiguous axial images were obtained from the base of the skull through the vertex without intravenous contrast.  COMPARISON:  Head CT scan 01/14/2013.  FINDINGS: A few areas of hypoattenuation are seen in the periventricular and subcortical deep white matter most consistent with chronic microvascular ischemic change. The brain is mildly atrophic. No evidence of acute intracranial abnormality including hemorrhage, infarct, mass lesion, mass effect, midline shift or abnormal extra-axial fluid collection is identified. No hydrocephalus or pneumocephalus. The calvarium is intact. Imaged paranasal sinuses and mastoid air cells are clear.  IMPRESSION: Acute finding.  Mild atrophy and chronic microvascular ischemic change.   Electronically Signed   By: Inge Rise M.D.   On: 01/02/2015 19:37   Ct Abdomen Pelvis W Contrast  01/14/2015   CLINICAL DATA:  Acute onset of worsening fatigue and disorientation. Vomiting and fever. Abdominal pain. Initial encounter.  EXAM: CT ABDOMEN AND PELVIS WITH CONTRAST  TECHNIQUE: Multidetector CT imaging of the abdomen and pelvis was performed using the standard protocol following bolus administration of intravenous contrast.  CONTRAST:  164mL OMNIPAQUE IOHEXOL 300 MG/ML  SOLN  COMPARISON:  Renal ultrasound performed 03/24/2014, and CT of the abdomen and pelvis performed 02/09/2014  FINDINGS: Bibasilar atelectasis or scarring is noted. Diffuse coronary artery calcifications are seen. Calcified nodes are noted about the mediastinum.  A small amount of ascites is noted anterior to the liver.  The liver is unremarkable in appearance. Scattered calcified granulomata are noted within the spleen. The gallbladder is within normal limits. The pancreas and adrenal glands are unremarkable.  Nonspecific perinephric  stranding is noted bilaterally. The kidneys are otherwise grossly unremarkable. There is no evidence of hydronephrosis. No renal or ureteral stones are seen.  The small bowel is unremarkable in appearance. The stomach is within normal limits. No acute vascular abnormalities are seen. Scattered calcification is noted along the abdominal aorta and its branches, including at the proximal renal arteries bilaterally.  The appendix is normal in caliber, without evidence of appendicitis.  Diffuse wall thickening is noted along the sigmoid colon, with wall enhancement and diffuse surrounding soft tissue edema, compatible with acute colitis, either infectious or inflammatory in nature. There is no evidence for ischemia. Trace fluid is noted within the pelvis. There is no evidence of perforation or abscess formation at this time.  A  small umbilical hernia is seen, containing only fat, with minimal associated soft tissue stranding.  The bladder is decompressed, with a Foley catheter in place. There is nodular density at the base of the bladder, reflecting infiltration from the prostate. Malignancy cannot be excluded. This is perhaps slightly more prominent than on the prior study. Bladder wall thickening may reflect chronic inflammation. No inguinal lymphadenopathy is seen.  No acute osseous abnormalities are identified. Multilevel vacuum phenomenon is noted along the lumbar spine. There is mild chronic loss of height at vertebral body L1. Anterior bridging osteophytes are seen along the lower thoracic spine.  IMPRESSION: 1. Diffuse wall thickening along the sigmoid colon, with wall enhancement and diffuse surrounding soft tissue edema, compatible with acute colitis, either infectious or inflammatory in nature. Trace fluid within the pelvis, and small amount of ascites anterior to the liver. No evidence of perforation or abscess formation at this time. 2. Nodular density at the base of the bladder is perhaps slightly more  prominent than in 2015, reflecting infiltration from the prostate. Malignancy cannot be excluded. Would correlate with PSA, and evaluate further as deemed clinically appropriate. 3. Bladder wall thickening may reflect chronic inflammation. 4. Scattered calcification along the abdominal aorta and its branches, including at the proximal renal arteries bilaterally. 5. Small umbilical hernia, containing only fat, with minimal associated soft tissue stranding. 6. Bibasilar atelectasis or scarring noted. 7. Diffuse coronary artery calcifications seen. 8. Mild diffuse degenerative change along the lumbar spine.   Electronically Signed   By: Garald Balding M.D.   On: 12/21/2014 19:52     ASSESSMENT / PLAN:  PULMONARY A: Acute hypoxic respiratory failure Pulmonary edema  P:   Supplemental O2 as needed. (currently comfortable on 2L) CXR  CARDIOVASCULAR A:  Septic shock Atrial Fib with RVR, history of this Chronic diastolic CHF (grade 1 DD) H/o NSTEMI  P:  Telemetry monitoring MAP goal > 65, SBP > 90 Aggressive IVF resuscitation Place CVL CVP monitoring Check ScvO2 Ensure lactic continues to clear Amiodarone bolus, gtt Troponins EKG in AM  RENAL A:   Acute kidney injury > improving Hypokalemia  P:   Follow Bmet Correct electrolytes as indicated  GASTROINTESTINAL A:   Transaminitis, suspect due to hypotension.  Colitis per CT Abdominal distension  P:   NPO SUP: IV protonix Trend LFT NGT to LIS  HEMATOLOGIC A:   No acute issues  P:  Follow CBC VTE ppx: SQ heparin  INFECTIOUS A:   Severe Sepsis suspect from UTI, chronic foley R great toe wound with hx cellulitis, osteomyelitis. Sacral Decub ? C-dif colitis  P:   BCx2 7/6 >>> UC 7/6 >>> Check C-dif 7/6 >>> Abx: Vancomycin, start date > 7/6 Abx: Zosyn, start date 7/6 > Abx: Flagyl, start date 7/6 > PCT algorithm  ENDOCRINE A:   Prednisone use, initiated 10/2014.  P:  Stress dose hydrocortisone CBG  monitoring and SSI   NEUROLOGIC A:   Acute metabolic encephalopathy Weakness CIDP, chronic inflammatory demyelinating polyneuropathy  P:   RASS goal: 0 Monitor   FAMILY  - Updates: updated wife at bedside in ED. Full code as they have never discussed advanced directive.  - Inter-disciplinary family meet or Palliative Care meeting due by:  7/13  [name], AGACNP-BC [place] Pulmonology/Critical Care Pager [phone] or [phone]  01/06/2015 11:04 PM

## 2014-12-22 NOTE — ED Notes (Signed)
Pt's family reports he is from [facility] facility and they report increased fatigue and disorientation. Has a Foley Catheter that has not been changed in "quite some time." Urine noted to be brown with sediment. Emesis occurences recently. Denies diarrhea. Facility reports a fever but did not know what it was. RR even/unlabored. No other c/c.

## 2014-12-22 NOTE — ED Notes (Addendum)
POC Lac 4.43 RN Blanch and DR Aline Brochure notified

## 2014-12-22 NOTE — ED Notes (Signed)
Awake. Verbally responsive. A/O x4. Resp even and unlabored. No audible adventitious breath sounds noted. ABC's intact. SR on monitor. IV's patent and intact infusing NS without difficulty. Family at bedside.

## 2014-12-22 NOTE — ED Notes (Signed)
Lactic 2.4 Called by Amy RN aware

## 2014-12-22 NOTE — ED Notes (Signed)
Pt has f/c for urinary retention from NH. F/C changed d/t insertion date was approx 29 days ago.

## 2014-12-23 ENCOUNTER — Inpatient Hospital Stay (HOSPITAL_COMMUNITY): Payer: Medicare Other

## 2014-12-23 LAB — BASIC METABOLIC PANEL
Anion gap: 10 (ref 5–15)
Anion gap: 8 (ref 5–15)
BUN: 35 mg/dL — ABNORMAL HIGH (ref 6–20)
BUN: 37 mg/dL — AB (ref 6–20)
CALCIUM: 7.6 mg/dL — AB (ref 8.9–10.3)
CO2: 17 mmol/L — AB (ref 22–32)
CO2: 19 mmol/L — AB (ref 22–32)
CREATININE: 0.95 mg/dL (ref 0.61–1.24)
Calcium: 7.8 mg/dL — ABNORMAL LOW (ref 8.9–10.3)
Chloride: 106 mmol/L (ref 101–111)
Chloride: 107 mmol/L (ref 101–111)
Creatinine, Ser: 1.14 mg/dL (ref 0.61–1.24)
GFR calc Af Amer: >60 mL/min (ref >60)
GFR calc non Af Amer: >60 mL/min (ref >60)
GFR, EST NON AFRICAN AMERICAN: 59 mL/min — AB (ref >60)
Glucose, Bld: 225 mg/dL — ABNORMAL HIGH (ref 65–99)
Glucose, Bld: 149 mg/dL — ABNORMAL HIGH (ref 65–99)
Potassium: 2.6 mmol/L — CL (ref 3.5–5.1)
Potassium: 3 mmol/L — ABNORMAL LOW (ref 3.5–5.1)
SODIUM: 133 mmol/L — AB (ref 135–145)
Sodium: 134 mmol/L — ABNORMAL LOW (ref 135–145)

## 2014-12-23 LAB — GLUCOSE, CAPILLARY
Glucose-Capillary: 194 mg/dL — ABNORMAL HIGH (ref 65–99)
Glucose-Capillary: 205 mg/dL — ABNORMAL HIGH (ref 65–99)
Glucose-Capillary: 177 mg/dL — ABNORMAL HIGH (ref 65–99)
Glucose-Capillary: 161 mg/dL — ABNORMAL HIGH (ref 65–99)
Glucose-Capillary: 128 mg/dL — ABNORMAL HIGH (ref 65–99)

## 2014-12-23 LAB — CLOSTRIDIUM DIFFICILE BY PCR: Toxigenic C. Difficile by PCR: POSITIVE — AB

## 2014-12-23 LAB — TROPONIN I
Troponin I: <0.03 ng/mL (ref <0.031)
Troponin I: 0.05 ng/mL — ABNORMAL HIGH (ref <0.031)
Troponin I: 0.18 ng/mL — ABNORMAL HIGH (ref <0.031)

## 2014-12-23 LAB — PHOSPHORUS
Phosphorus: 3.3 mg/dL (ref 2.5–4.6)
Phosphorus: 2.7 mg/dL (ref 2.5–4.6)

## 2014-12-23 LAB — CBC
HEMATOCRIT: 37.8 % — AB (ref 39.0–52.0)
HEMOGLOBIN: 11.8 g/dL — AB (ref 13.0–17.0)
MCH: 27.1 pg (ref 26.0–34.0)
MCHC: 31.2 g/dL (ref 30.0–36.0)
MCV: 86.9 fL (ref 78.0–100.0)
Platelets: 366 10*3/uL (ref 150–400)
RBC: 4.35 MIL/uL (ref 4.22–5.81)
RDW: 14.5 % (ref 11.5–15.5)
WBC: 37.6 10*3/uL — ABNORMAL HIGH (ref 4.0–10.5)

## 2014-12-23 LAB — LIPASE, BLOOD: Lipase: <10 U/L — ABNORMAL LOW (ref 22–51)

## 2014-12-23 LAB — PROCALCITONIN: PROCALCITONIN: 2.33 ng/mL

## 2014-12-23 LAB — MAGNESIUM
MAGNESIUM: 1.7 mg/dL (ref 1.7–2.4)
MAGNESIUM: 1.9 mg/dL (ref 1.7–2.4)

## 2014-12-23 LAB — CORTISOL

## 2014-12-23 LAB — AMYLASE: AMYLASE: 14 U/L — AB (ref 28–100)

## 2014-12-23 LAB — MRSA PCR SCREENING: MRSA BY PCR: NEGATIVE

## 2014-12-23 MED ORDER — CHLORHEXIDINE GLUCONATE 0.12 % MT SOLN
15.0000 mL | Freq: Two times a day (BID) | OROMUCOSAL | Status: DC
  Administered 2014-12-23 – 2014-12-24 (×4): 15 mL via OROMUCOSAL
  Filled 2014-12-23 (×4): qty 15

## 2014-12-23 MED ORDER — POTASSIUM CHLORIDE 10 MEQ/50ML IV SOLN
10.0000 meq | INTRAVENOUS | Status: AC
  Administered 2014-12-23 (×6): 10 meq via INTRAVENOUS
  Filled 2014-12-23 (×6): qty 50

## 2014-12-23 MED ORDER — AMIODARONE IV BOLUS ONLY 150 MG/100ML
150.0000 mg | Freq: Once | INTRAVENOUS | Status: AC
  Administered 2014-12-23: 150 mg via INTRAVENOUS
  Filled 2014-12-23: qty 100

## 2014-12-23 MED ORDER — POTASSIUM CHLORIDE 10 MEQ/50ML IV SOLN
INTRAVENOUS | Status: AC
  Administered 2014-12-23: 10 meq via INTRAVENOUS
  Filled 2014-12-23: qty 50

## 2014-12-23 MED ORDER — PHENYLEPHRINE HCL 10 MG/ML IJ SOLN
30.0000 ug/min | INTRAVENOUS | Status: DC
  Filled 2014-12-23: qty 4

## 2014-12-23 MED ORDER — AMIODARONE HCL IN DEXTROSE 360-4.14 MG/200ML-% IV SOLN
INTRAVENOUS | Status: AC
  Administered 2014-12-23: 60 mg/h
  Filled 2014-12-23: qty 200

## 2014-12-23 MED ORDER — MAGNESIUM SULFATE IN D5W 10-5 MG/ML-% IV SOLN
1.0000 g | Freq: Once | INTRAVENOUS | Status: AC
  Administered 2014-12-23: 1 g via INTRAVENOUS
  Filled 2014-12-23: qty 100

## 2014-12-23 MED ORDER — NOREPINEPHRINE BITARTRATE 1 MG/ML IV SOLN: 2.0000 ug/min | INTRAVENOUS | Status: DC

## 2014-12-23 MED ORDER — INSULIN ASPART 100 UNIT/ML ~~LOC~~ SOLN
2.0000 [IU] | SUBCUTANEOUS | Status: DC
  Administered 2014-12-23: 6 [IU] via SUBCUTANEOUS
  Administered 2014-12-23 (×2): 4 [IU] via SUBCUTANEOUS
  Administered 2014-12-23 – 2014-12-24 (×3): 2 [IU] via SUBCUTANEOUS
  Administered 2014-12-24: 4 [IU] via SUBCUTANEOUS
  Administered 2014-12-24 (×2): 2 [IU] via SUBCUTANEOUS
  Administered 2014-12-24 – 2014-12-25 (×2): 4 [IU] via SUBCUTANEOUS

## 2014-12-23 MED ORDER — AMIODARONE IV BOLUS ONLY 150 MG/100ML
INTRAVENOUS | Status: AC
  Administered 2014-12-23: 150 mg
  Filled 2014-12-23: qty 100

## 2014-12-23 MED ORDER — AMIODARONE LOAD VIA INFUSION
150.0000 mg | Freq: Once | INTRAVENOUS | Status: AC
  Administered 2014-12-23: 150 mg via INTRAVENOUS
  Filled 2014-12-23: qty 83.34

## 2014-12-23 MED ORDER — POTASSIUM CHLORIDE 10 MEQ/50ML IV SOLN
10.0000 meq | INTRAVENOUS | Status: AC
  Administered 2014-12-23 (×4): 10 meq via INTRAVENOUS
  Filled 2014-12-23 (×4): qty 50

## 2014-12-23 MED ORDER — CETYLPYRIDINIUM CHLORIDE 0.05 % MT LIQD
7.0000 mL | Freq: Two times a day (BID) | OROMUCOSAL | Status: DC
  Administered 2014-12-23 – 2014-12-24 (×4): 7 mL via OROMUCOSAL

## 2014-12-23 MED ORDER — HYDROCORTISONE NA SUCCINATE PF 100 MG IJ SOLR
50.0000 mg | Freq: Four times a day (QID) | INTRAMUSCULAR | Status: DC
  Administered 2014-12-23 (×2): 50 mg via INTRAVENOUS
  Filled 2014-12-23 (×3): qty 2

## 2014-12-23 MED ORDER — HYDROCORTISONE NA SUCCINATE PF 100 MG IJ SOLR
50.0000 mg | Freq: Two times a day (BID) | INTRAMUSCULAR | Status: DC
  Administered 2014-12-23 – 2014-12-24 (×3): 50 mg via INTRAVENOUS
  Filled 2014-12-23 (×3): qty 2

## 2014-12-23 MED ORDER — PHENYLEPHRINE HCL 10 MG/ML IJ SOLN
30.0000 ug/min | INTRAVENOUS | Status: DC
  Administered 2014-12-23: 50 ug/min via INTRAVENOUS
  Filled 2014-12-23 (×2): qty 1

## 2014-12-23 MED ORDER — DEXTROSE 5 % IV SOLN
30.0000 ug/min | INTRAVENOUS | Status: DC
  Administered 2014-12-23: 180 ug/min via INTRAVENOUS
  Administered 2014-12-23 (×2): 200 ug/min via INTRAVENOUS
  Administered 2014-12-23: 170 ug/min via INTRAVENOUS
  Administered 2014-12-23 – 2014-12-24 (×4): 200 ug/min via INTRAVENOUS
  Filled 2014-12-23 (×9): qty 4

## 2014-12-23 NOTE — Care Management Note (Signed)
Case Management Note  Patient Details  Name: Dylan Nelson MRN: [id number] Date of Birth: November 30, 1933  Subjective/Objective:            sepsis       Action/Plan: home   Expected Discharge Date:   (unknown)         [id number]      Expected Discharge Plan:  Home/Self Care  In-House Referral:  NA  Discharge planning Services  CM Consult  Post Acute Care Choice:  NA Choice offered to:  NA  DME Arranged:  N/A DME Agency:  NA  HH Arranged:  NA HH Agency:  NA  Status of Service:  In process, will continue to follow  Medicare Important Message Given:    Date Medicare IM Given:    Medicare IM give by:    Date Additional Medicare IM Given:    Additional Medicare Important Message give by:     If discussed at [place] of Stay Meetings, dates discussed:    Additional Comments:  Leeroy Cha, RN 12/23/2014, 10:36 AM

## 2014-12-23 NOTE — Progress Notes (Signed)
[facility] Progress Note Patient Name: Dylan Nelson DOB: 07-Mar-1934 MRN: [id number]   Date of Service  12/23/2014  HPI/Events of Note  K=3.0  eICU Interventions  67meq KCL via CVL     Intervention Category Intermediate Interventions: Electrolyte abnormality - evaluation and management  [name] 12/23/2014, 4:35 PM

## 2014-12-23 NOTE — Progress Notes (Signed)
CRITICAL VALUE ALERT  Critical value received:  K 2.6  Date of notification:  12/23/2014  Time of notification:  0559  Critical value read back:Yes.    Nurse who received alert:  Luther Parody, RN  MD notified (1st page):  Sommer  Time of first page:  0559  MD notified (2nd page):  Time of second page:  Responding MD:  Oletta Darter  Time MD responded:  [phone]

## 2014-12-23 NOTE — Progress Notes (Signed)
[facility] Progress Note Patient Name: Dylan Nelson DOB: Oct 18, 1933 MRN: [id number]   Date of Service  12/23/2014  HPI/Events of Note  K+ = 2.6 and Creatinine = 1.14.   eICU Interventions  Will replete K+ and re-check BMP at 2 PM.     Intervention Category Intermediate Interventions: Electrolyte abnormality - evaluation and management  Sommer,Steven Eugene 12/23/2014, 6:18 AM

## 2014-12-23 NOTE — Progress Notes (Signed)
[facility] Progress Note Patient Name: Dylan Nelson DOB: [birth year] MRN: [id number]   Date of Service  12/23/2014  HPI/Events of Note  Multiple issues: 1. Ventricular rate remains = 144 and 2. Mg++ = 1.7 and Creatinine = 1.41.  eICU Interventions  Will replace Mg++ and rebolus with Amiodarone 150 mg IV over 10 minutes now.      Intervention Category Major Interventions: Arrhythmia - evaluation and management Intermediate Interventions: Electrolyte abnormality - evaluation and management  Sommer,Steven Eugene 12/23/2014, 3:11 AM

## 2014-12-23 NOTE — Procedures (Signed)
Central Venous Catheter Insertion Procedure Note Dylan Nelson [id number] 02-04-1934  Procedure: Insertion of Central Venous Catheter Indications: Assessment of intravascular volume, Drug and/or fluid administration and Frequent blood sampling  Procedure Details Consent: Risks of procedure as well as the alternatives and risks of each were explained to the (patient/caregiver).  Consent for procedure obtained. Time Out: Verified patient identification, verified procedure, site/side was marked, verified correct patient position, special equipment/implants available, medications/allergies/relevent history reviewed, required imaging and test results available.  Performed  Maximum sterile technique was used including antiseptics, cap, gloves, gown, hand hygiene, mask and sheet. Skin prep: Chlorhexidine; local anesthetic administered A antimicrobial bonded/coated triple lumen catheter was placed in the left internal jugular vein using the Seldinger technique. Ultrasound guidance used.Yes.   Catheter placed to 20 cm. Blood aspirated via all 3 ports and then flushed x 3. Line sutured x 2 and dressing applied.  Evaluation Blood flow good Complications: No apparent complications Patient did tolerate procedure well. Chest X-ray ordered to verify placement.  CXR: pending.  [name], AGACNP-BC [facility] Pulmonology/Critical Care Pager [phone] or [phone]  12/23/2014 12:32 AM

## 2014-12-23 NOTE — Progress Notes (Signed)
  Amiodarone Drug - Drug Interaction Consult Note  Recommendations:  Amiodarone is metabolized by the cytochrome P450 system and therefore has the potential to cause many drug interactions. Amiodarone has an average plasma half-life of 50 days (range 20 to 100 days).   There is potential for drug interactions to occur several weeks or months after stopping treatment and the onset of drug interactions may be slow after initiating amiodarone.   []  Statins: Increased risk of myopathy. Simvastatin- restrict dose to 20mg  daily. Other statins: counsel patients to report any muscle pain or weakness immediately.  []  Anticoagulants: Amiodarone can increase anticoagulant effect. Consider warfarin dose reduction. Patients should be monitored closely and the dose of anticoagulant altered accordingly, remembering that amiodarone levels take several weeks to stabilize.  []  Antiepileptics: Amiodarone can increase plasma concentration of phenytoin, the dose should be reduced. Note that small changes in phenytoin dose can result in large changes in levels. Monitor patient and counsel on signs of toxicity.  []  Beta blockers: increased risk of bradycardia, AV block and myocardial depression. Sotalol - avoid concomitant use.  []   Calcium channel blockers (diltiazem and verapamil): increased risk of bradycardia, AV block and myocardial depression.  []   Cyclosporine: Amiodarone increases levels of cyclosporine. Reduced dose of cyclosporine is recommended.  []  Digoxin dose should be halved when amiodarone is started.  []  Diuretics: increased risk of cardiotoxicity if hypokalemia occurs.  []  Oral hypoglycemic agents (glyburide, glipizide, glimepiride): increased risk of hypoglycemia. Patient's glucose levels should be monitored closely when initiating amiodarone therapy.   []  Drugs that prolong the QT interval:  Torsades de pointes risk may be increased with concurrent use - avoid if possible.  Monitor QTc, also  keep magnesium/potassium WNL if concurrent therapy can't be avoided. Marland Kitchen Antibiotics: e.g. fluoroquinolones, erythromycin. . Antiarrhythmics: e.g. quinidine, procainamide, disopyramide, sotalol. . Antipsychotics: e.g. phenothiazines, haloperidol.  . Lithium, tricyclic antidepressants, and methadone. Thank You,  Nani Skillern Crowford  12/23/2014 6:53 AM

## 2014-12-23 NOTE — Consult Note (Signed)
WOC wound consult note Reason for Consult: Patient with intertriginous dermatis in the groin and beneath scrotum, incontinence associated dermatitis with Stage 3 pressure injury, toe lesions. Sacrum is intact.  Wound type:Moisture plus pressure Pressure Ulcer POA: Yes Measurement:Stage 3 pressure injury measuring 1.5cm x 1cm in an area of intertriginous dermatitis measuring 12cm x 16cm and presenting with a 50% yellow slough base and 50% pink base. Dried ulcer at RGT (distal end) measuring 1cm x 1.5cm x 0.2cm.  Area at base of right toe measures 2cm x 3cm and is a healing partial thickness tissue loss. Pink, dry. Wound bed: As described above Drainage (amount, consistency, odor) Scant serous from Stage 3 in area of IAD, scant serous from bilateral groin and none from toe. Periwound:intact, dry Dressing procedure/placement/frequency: I will add a twice daily saline dressing to the right great toe until healed.  Incontinence care with a moisture barrier ointment is added for the IAD with Stage 3 ulcer. A prophylactic dressing is placed over the sacrum.  An antimicrobial textile is placed for the ITD in the groin and will act as a scrotal sling. Turning and repositioning per protocol is in place and will continue.  Keep HOB at or below a 30 degree angle.  [place] nursing team will not follow, but will remain available to this patient, the nursing and medical team.  Please re-consult if needed. Thanks, Maudie Flakes, MSN, RN, [place], [place], [place] [phone])

## 2014-12-23 NOTE — Progress Notes (Signed)
CRITICAL VALUE ALERT  Critical value received:  Positive cdiff  Date of notification:  12/23/2014  Time of notification:  8144  Critical value read back:Yes.    Nurse who received alert:  Avon Gully, RN  MD notified (1st page):  Dr. Stevenson Clinch  Time of first page:  1730  Responding MD:  Dr. Stevenson Clinch  Time MD responded:  [phone]

## 2014-12-23 NOTE — Progress Notes (Signed)
Date:  December 23, 2014 U.R. performed for needs and level of care. Sepsis Will continue to follow for Case Management needs.  Velva Harman, RN, BSN, [place]   [phone]

## 2014-12-24 ENCOUNTER — Inpatient Hospital Stay (HOSPITAL_COMMUNITY): Payer: Medicare Other

## 2014-12-24 DIAGNOSIS — A419 Sepsis, unspecified organism: Principal | ICD-10-CM

## 2014-12-24 DIAGNOSIS — R6521 Severe sepsis with septic shock: Secondary | ICD-10-CM

## 2014-12-24 DIAGNOSIS — L8990 Pressure ulcer of unspecified site, unspecified stage: Secondary | ICD-10-CM | POA: Insufficient documentation

## 2014-12-24 DIAGNOSIS — A047 Enterocolitis due to Clostridium difficile: Secondary | ICD-10-CM

## 2014-12-24 LAB — CBC
HEMATOCRIT: 38.3 % — AB (ref 39.0–52.0)
HEMOGLOBIN: 13 g/dL (ref 13.0–17.0)
MCH: 28.8 pg (ref 26.0–34.0)
MCHC: 33.9 g/dL (ref 30.0–36.0)
MCV: 84.7 fL (ref 78.0–100.0)
Platelets: 333 10*3/uL (ref 150–400)
RBC: 4.52 MIL/uL (ref 4.22–5.81)
RDW: 14.5 % (ref 11.5–15.5)
WBC: 35.9 10*3/uL — ABNORMAL HIGH (ref 4.0–10.5)

## 2014-12-24 LAB — GLUCOSE, CAPILLARY
GLUCOSE-CAPILLARY: 138 mg/dL — AB (ref 65–99)
GLUCOSE-CAPILLARY: 145 mg/dL — AB (ref 65–99)
Glucose-Capillary: 130 mg/dL — ABNORMAL HIGH (ref 65–99)
Glucose-Capillary: 147 mg/dL — ABNORMAL HIGH (ref 65–99)
Glucose-Capillary: 167 mg/dL — ABNORMAL HIGH (ref 65–99)
Glucose-Capillary: 178 mg/dL — ABNORMAL HIGH (ref 65–99)

## 2014-12-24 LAB — URINE CULTURE: Culture: 90000

## 2014-12-24 LAB — BASIC METABOLIC PANEL
ANION GAP: 10 (ref 5–15)
BUN: 42 mg/dL — ABNORMAL HIGH (ref 6–20)
CALCIUM: 7.9 mg/dL — AB (ref 8.9–10.3)
CO2: 17 mmol/L — ABNORMAL LOW (ref 22–32)
CREATININE: 1.2 mg/dL (ref 0.61–1.24)
Chloride: 107 mmol/L (ref 101–111)
GFR calc Af Amer: >60 mL/min (ref >60)
GFR calc non Af Amer: 56 mL/min — ABNORMAL LOW (ref >60)
GLUCOSE: 153 mg/dL — AB (ref 65–99)
Potassium: 3.3 mmol/L — ABNORMAL LOW (ref 3.5–5.1)
SODIUM: 134 mmol/L — AB (ref 135–145)

## 2014-12-24 LAB — PROCALCITONIN: PROCALCITONIN: 4.97 ng/mL

## 2014-12-24 MED ORDER — FENTANYL CITRATE (PF) 100 MCG/2ML IJ SOLN
25.0000 ug | INTRAMUSCULAR | Status: DC | PRN
  Administered 2014-12-25 (×2): 25 ug via INTRAVENOUS
  Filled 2014-12-24 (×2): qty 2

## 2014-12-24 MED ORDER — HALOPERIDOL LACTATE 5 MG/ML IJ SOLN
1.0000 mg | Freq: Four times a day (QID) | INTRAMUSCULAR | Status: DC | PRN
  Administered 2014-12-24: 1 mg via INTRAVENOUS
  Filled 2014-12-24: qty 1

## 2014-12-24 MED ORDER — SODIUM CHLORIDE 0.9 % IV BOLUS (SEPSIS)
1000.0000 mL | Freq: Once | INTRAVENOUS | Status: AC
  Administered 2014-12-24: 1000 mL via INTRAVENOUS

## 2014-12-24 MED ORDER — POTASSIUM CHLORIDE 10 MEQ/50ML IV SOLN
INTRAVENOUS | Status: AC
  Administered 2014-12-24: 10 meq
  Filled 2014-12-24: qty 150

## 2014-12-24 MED ORDER — NOREPINEPHRINE BITARTRATE 1 MG/ML IV SOLN
2.0000 ug/min | INTRAVENOUS | Status: DC
  Administered 2014-12-24: 4 ug/min via INTRAVENOUS
  Administered 2014-12-24: 8 ug/min via INTRAVENOUS
  Filled 2014-12-24 (×3): qty 4

## 2014-12-24 MED ORDER — FAMOTIDINE IN NACL 20-0.9 MG/50ML-% IV SOLN
20.0000 mg | Freq: Two times a day (BID) | INTRAVENOUS | Status: DC
  Administered 2014-12-24 (×2): 20 mg via INTRAVENOUS
  Filled 2014-12-24 (×2): qty 50

## 2014-12-24 MED ORDER — IPRATROPIUM-ALBUTEROL 0.5-2.5 (3) MG/3ML IN SOLN
3.0000 mL | Freq: Four times a day (QID) | RESPIRATORY_TRACT | Status: DC
  Administered 2014-12-24 (×2): 3 mL via RESPIRATORY_TRACT
  Filled 2014-12-24 (×3): qty 3

## 2014-12-24 MED ORDER — VANCOMYCIN 50 MG/ML ORAL SOLUTION
500.0000 mg | Freq: Four times a day (QID) | ORAL | Status: DC
  Administered 2014-12-24 (×3): 500 mg via ORAL
  Filled 2014-12-24 (×8): qty 10

## 2014-12-24 MED ORDER — POTASSIUM CHLORIDE 10 MEQ/50ML IV SOLN
10.0000 meq | INTRAVENOUS | Status: AC
  Administered 2014-12-24 (×2): 10 meq via INTRAVENOUS

## 2014-12-24 MED ORDER — VASOPRESSIN 20 UNIT/ML IV SOLN
0.0300 [IU]/min | INTRAVENOUS | Status: DC
Start: 2014-12-24 — End: 2014-12-25
  Administered 2014-12-24: 0.03 [IU]/min via INTRAVENOUS
  Filled 2014-12-24: qty 2

## 2014-12-24 MED ORDER — SODIUM CHLORIDE 0.9 % IV BOLUS (SEPSIS)
500.0000 mL | Freq: Once | INTRAVENOUS | Status: AC
  Administered 2014-12-24: 500 mL via INTRAVENOUS

## 2014-12-24 NOTE — Clinical Documentation Improvement (Signed)
Please clarify whether  UTI/Sepsis is related to the foley catheter.   . Sepsis-specify causative organism if known . Sepsis due to: --Device --Implant  . Severe sepsis-sepsis with organ dysfunction    Supporting Information: H&P: "admitted with indwelling foley, complaining of lower abdominal pain, was recently treated with Rocephin for UTI at the SNF per family 7/6 he presented to urology office to have indwelling foley exchanged"  Thank You,  Melvia Heaps, RN, BSN, [place].

## 2014-12-24 NOTE — Progress Notes (Signed)
[place] Progress Note Patient Name: Dylan Nelson DOB: 22-Mar-1934 MRN: [id number]   Date of Service  12/24/2014  HPI/Events of Note  Multiple issues: 1. Oliguria and 2. Delirium.  eICU Interventions  Will order: 1. Haldol 1 mg IV Q 6 hours PRN. 2. 0.9 NaCl 1 liter IV over 1 hour now.      Intervention Category Major Interventions: Delirium, psychosis, severe agitation - evaluation and management Intermediate Interventions: Oliguria - evaluation and management  Sommer,Steven Eugene 12/24/2014, 7:15 PM

## 2014-12-24 NOTE — Progress Notes (Signed)
PULMONARY / CRITICAL CARE MEDICINE   Name: Dylan Nelson MRN: [id number] DOB: December 03, 1933    ADMISSION DATE:  12/17/2014 CONSULTATION DATE:  01/13/2015  REFERRING MD :  EDP  CHIEF COMPLAINT:  Sepsis  INITIAL PRESENTATION: 81 year old male presented from urology office with weakness and hypotension. In ED he was found to have findings suspicious for UTI with associated hypotension and elevated lactic. BP was somewhat responsive to IVF resuscitation with MAP borderline. PCCM to see.   STUDIES:  7/6 CT abdomen > diffuse thickening of sigmoid colon, surrounding soft tissues edema, compatible with acute colitis. Trace fluid in pelvis, trace ascites. Nodular density at base of bladder, bladder wall thickening. Scattered calcification of the abdominal aorta. 7/6 CT head > No acute finding. Mild atrophy and microvascular ischemic change.   SIGNIFICANT EVENTS: 7/7 remains on neo gtt   HISTORY OF PRESENT ILLNESS:  81 year old male with PMH as below, which includes HTN, CIDP, Grade 1 DD, Recurrent UTI r/t chronic foley, R toe wound with cellulitis, osteomyelitis, and PVD. He is a resident at [facility]. His best health day leaves him confined to wheelchair with lower extremity weakness. He also has issues with dysphagia, which often prevents him from getting adequate PO intake. He was recently treated with Rocephin for UTI at the SNF per family. He has been complaining of lower abdominal pain x3 days assocaited with N/V/D and cough. 7/6 he presented to urology office to have indwelling foley exchanged. While there he was noted to have AMS with profound weakness. Bp would not register on monitors there. He was referred to ED for further eval. In  ED he was noted to to be alert and oriented, but with profound hypotension with systolic pressures in the 60's. He was also tachycardic and tachypneic. These things improved somewhat was IVF resuscitation. Initial lactic 4.5 which has since cleared to just over 2.   MAPs remianed borderline at around 65. UA is concerning for another UTI. PCCM called for ICU admission.    SUBJECTIVE: Continues on 200 mcg neo gtt C/o mild abdominal pain on left flank UO poor  VITAL SIGNS: Temp:  [97.4 F (36.3 C)-98.4 F (36.9 C)] 97.4 F (36.3 C) (07/08 0745) Pulse Rate:  [33-110] 101 (07/08 0700) Resp:  [23-47] 42 (07/08 0700) BP: (73-117)/(36-74) 88/65 mmHg (07/08 0700) SpO2:  [0 %-100 %] 100 % (07/08 0700) Weight:  [245 lb 6 oz (111.3 kg)] 245 lb 6 oz (111.3 kg) (07/08 0321) HEMODYNAMICS:   VENTILATOR SETTINGS:   INTAKE / OUTPUT:  Intake/Output Summary (Last 24 hours) at 12/24/14 5374 Last data filed at 12/24/14 0700  Gross per 24 hour  Intake   2414 ml  Output    755 ml  Net   1659 ml    PHYSICAL EXAMINATION: General:  Elderly male in NAD  Neuro:  Alert, oriented to self. Non-focal HEENT:  [place]/AT, no JVD noted, PERRL Cardiovascular:  Tachy, regular, non MRG noted Lungs:  Clear bilateral breath sounds, mild tachypnea, unlabored  Abdomen:  Mildly distended, firm, non-tender. Musculoskeletal:  +3 pitting edema to BLE. TED and ortho shoes in place.  Skin:  Wound to R great toe. Sacral decub  LABS:  CBC  Recent Labs Lab 01/04/2015 2155 12/23/14 0435 12/24/14 0400  WBC 25.3* 37.6* 35.9*  HGB 11.6* 11.8* 13.0  HCT 37.0* 37.8* 38.3*  PLT 238 366 333   Coag's  Recent Labs Lab 01/10/2015 2155  APTT 34  INR 1.35   BMET  Recent Labs Lab 12/23/14 0435 12/23/14 1515 12/24/14 0400  NA 133* 134* 134*  K 2.6* 3.0* 3.3*  CL 106 107 107  CO2 17* 19* 17*  BUN 35* 37* 42*  CREATININE 1.14 0.95 1.20  GLUCOSE 225* 149* 153*   Electrolytes  Recent Labs Lab 01/13/2015 2155 12/23/14 0435 12/23/14 1515 12/24/14 0400  CALCIUM 7.9* 7.6* 7.8* 7.9*  MG 1.7 1.9  --   --   PHOS 3.3 2.7  --   --    Sepsis Markers  Recent Labs Lab 01/09/2015 1616 12/23/2014 2155 12/17/2014 2206 12/23/14 0435 12/24/14 0400  LATICACIDVEN 4.43* 2.4* 2.11*  --    --   PROCALCITON  --  2.37  --  2.33 4.97   ABG No results for input(s): PHART, PCO2ART, PO2ART in the last 168 hours. Liver Enzymes  Recent Labs Lab 12/18/2014 1502 12/24/2014 2155  AST 44* 60*  ALT 39 44  ALKPHOS 87 64  BILITOT 0.8 0.6  ALBUMIN 3.1* 2.5*   Cardiac Enzymes  Recent Labs Lab 01/02/2015 2155 12/23/14 0435 12/23/14 1120  TROPONINI <0.03 0.05* 0.18*   Glucose  Recent Labs Lab 12/23/14 1134 12/23/14 1544 12/23/14 1926 12/24/14 0008 12/24/14 0317 12/24/14 0739  GLUCAP 177* 161* 128* 130* 138* 145*    Imaging No results found.   ASSESSMENT / PLAN:  PULMONARY A: Acute hypoxic respiratory failure Acute Pulmonary edema  P:   Supplemental O2 as needed. (currently comfortable on 2L)   CARDIOVASCULAR A:  Septic shock -lactate cleared Atrial Fib with RVR, history of this Chronic diastolic CHF (grade 1 DD) H/o NSTEMI  P:   MAP goal > 65, SBP > 90 Aggressive IVF resuscitation per CVP  Amiodarone if needed for RVR Change from neo gtt to levo gt & add vaso   RENAL A:   Acute kidney injury > improving Hypokalemia  P:   Follow Bmet Correct electrolytes as indicated  GASTROINTESTINAL A:   Transaminitis, suspect due to hypotension.  C d iff Colitis  Abdominal distension  P:   NPO SUP: IV pepcid instead of protonix NGT to LIS  HEMATOLOGIC A:   No acute issues  P:  Follow CBC VTE ppx: SQ heparin  INFECTIOUS A:   Septic shock suspect from UTI, chronic foley R great toe wound with hx cellulitis, osteomyelitis. Sacral Decub C-dif colitis  P:   BCx2 7/6 >>> UC 7/6 >>> ng Check C-dif 7/6 >>> Abx: Vancomycin, start date > 7/6 >> 7/8 Abx: Zosyn, start date 7/6 > Abx: Flagyl, start date 7/6 > PO vanc 7/8 >> PCT algorithm  ENDOCRINE A:   Prednisone use, initiated 10/2014.  P:  Stress dose hydrocortisone -drop to q 12h since cortisol > 100 CBG monitoring and SSI   NEUROLOGIC A:   Acute metabolic encephalopathy CIDP,  chronic inflammatory demyelinating polyneuropathy  P:   RASS goal: 0    FAMILY  - Updates: updated wife at bedside in ED. Full code as they have never discussed advanced directive.  - Inter-disciplinary family meet or Palliative Care meeting due by:  7/13   Summary - C diff colitis with septic shock - doubt other sources -foley UTI vs cellulitis, Since lactate cleared & no evidence of megacolon, will persist with medical therapy but consider surgical if does not come off pressors next 24h   The patient is critically ill with multiple organ systems failure and requires high complexity decision making for assessment and support, frequent evaluation and titration of therapies, application of advanced  monitoring technologies and extensive interpretation of multiple databases. Critical Care Time devoted to patient care services described in this note independent of APP time is 35 minutes.   Kara Mead MD. [place].  Pulmonary & Critical care Pager [phone] If no response call [phone]    12/24/2014 9:23 AM

## 2014-12-24 NOTE — Clinical Social Work Note (Signed)
Clinical Social Work Assessment  Patient Details  Name: Dylan Nelson MRN: [id number] Date of Birth: 1933-08-04  Date of referral:  12/24/14               Reason for consult:  Facility Placement, Discharge Planning                Permission sought to share information with:    Permission granted to share information::     Name::        Agency::     Relationship::     Contact Information:     Housing/Transportation Living arrangements for the past 2 months:  [place] of Information:  Power of Attorney Patient Interpreter Needed:  None Criminal Activity/Legal Involvement Pertinent to Current Situation/Hospitalization:  No - Comment as needed Significant Relationships:  Siblings, Spouse Lives with:  Facility Resident Do you feel safe going back to the place where you live?  Yes Need for family participation in patient care:  Yes (Comment)  Care giving concerns:  Pt's sister is concerned that SNF did not send pt to hospital prior to out pt MD appt.  Social Worker assessment / plan:  Pt hospitalized on 12/30/2014 with multiple medical concerns including sepsis. Pt is a long term care resident from [facility]. Pt shares a room with his spouse at facility. Spouse has dementia. CSW met with pt / spouse / pt's sister to assist with d/c planning. Pt's sister reports that pt will return to [facility] at d/c. CSW contacted SNF and d/c plan has been confirmed. CSW will continue to follow to assist with d/c planning to SNF.  Employment status:  Retired Forensic scientist:  Information systems manager, Medicaid [place] PT Recommendations:  Not assessed at this time [place] / Referral to community resources:  Other (Comment Required) (None needed at this time.)  Patient/Family's Response to care:  Pt has some concerns regarding care at SNF and has been encouraged to speak with DON. Family does not want new placement since pt / spouse live together at [employer].  Patient/Family's  Understanding of and Emotional Response to Diagnosis, Current Treatment, and Prognosis: Pt's sister has a good understanding of pt condition and is concerned.   Emotional Assessment Appearance:  Appears stated age Attitude/Demeanor/Rapport:  Other (cooperative) Affect (typically observed):  Calm Orientation:  Oriented to Self, Oriented to Place Alcohol / Substance use:  Not Applicable Psych involvement (Current and /or in the community):  No (Comment)  Discharge Needs  Concerns to be addressed:  Discharge Planning Concerns Readmission within the last 30 days:  No Current discharge risk:  None Barriers to Discharge:  No Barriers Identified   Luretha Rued, [place] 12/24/2014, 12:05 PM

## 2014-12-24 NOTE — Progress Notes (Signed)
ANTIBIOTIC CONSULT NOTE - follow up  Pharmacy Consult for  Zosyn Indication: Sepsis, UTI, R great toe wound, sacral decub  No Known Allergies  Patient Measurements: Height: 6\' 1"  (185.4 cm) Weight: 245 lb 6 oz (111.3 kg) IBW/kg (Calculated) : 79.9  Vital Signs: Temp: 97.4 F (36.3 C) (07/08 0745) Temp Source: Oral (07/08 0745) BP: 88/65 mmHg (07/08 0700) Pulse Rate: 101 (07/08 0700) Intake/Output from previous day: 07/07 0701 - 07/08 0700 In: 2684 [I.V.:1734; IV Piggyback:950] Out: 810 [Urine:610; Emesis/NG output:200] Intake/Output from this shift:    Labs:  Recent Labs  12/20/2014 2155 12/23/14 0435 12/23/14 1515 12/24/14 0400  WBC 25.3* 37.6*  --  35.9*  HGB 11.6* 11.8*  --  13.0  PLT 238 366  --  333  CREATININE 1.41* 1.14 0.95 1.20   Estimated Creatinine Clearance: 65.3 mL/min (by C-G formula based on Cr of 1.2). No results for input(s): VANCOTROUGH, VANCOPEAK, VANCORANDOM, GENTTROUGH, GENTPEAK, GENTRANDOM, TOBRATROUGH, TOBRAPEAK, TOBRARND, AMIKACINPEAK, AMIKACINTROU, AMIKACIN in the last 72 hours.   Microbiology: Recent Results (from the past 720 hour(s))  Blood Culture (routine x 2)     Status: None (Preliminary result)   Collection Time: 12/29/2014  3:56 PM  Result Value Ref Range Status   Specimen Description BLOOD RIGHT ANTECUBITAL  Final   Special Requests BOTTLES DRAWN AEROBIC AND ANAEROBIC 5 CC EA  Final   Culture   Final    NO GROWTH < 24 HOURS Performed at [facility]    Report Status PENDING  Incomplete  Blood Culture (routine x 2)     Status: None (Preliminary result)   Collection Time: 01/15/2015  4:04 PM  Result Value Ref Range Status   Specimen Description BLOOD RIGHT WRIST  Final   Special Requests BOTTLES DRAWN AEROBIC AND ANAEROBIC 5 CC EA  Final   Culture   Final    NO GROWTH < 24 HOURS Performed at [facility]    Report Status PENDING  Incomplete  Urine culture     Status: None (Preliminary result)   Collection Time:  01/03/2015  6:19 PM  Result Value Ref Range Status   Specimen Description URINE, CATHETERIZED  Final   Special Requests NONE  Final   Culture   Final    NO GROWTH < 24 HOURS Performed at [facility]    Report Status PENDING  Incomplete  MRSA PCR Screening     Status: None   Collection Time: 12/23/14 12:51 AM  Result Value Ref Range Status   MRSA by PCR NEGATIVE NEGATIVE Final    Comment:        The GeneXpert MRSA Assay (FDA approved for NASAL specimens only), is one component of a comprehensive MRSA colonization surveillance program. It is not intended to diagnose MRSA infection nor to guide or monitor treatment for MRSA infections.   Clostridium Difficile by PCR (not at [facility])     Status: Abnormal   Collection Time: 12/23/14  4:11 PM  Result Value Ref Range Status   C difficile by pcr POSITIVE (A) NEGATIVE Final    Comment: REPEATED TO VERIFY CRITICAL RESULT CALLED TO, READ BACK BY AND VERIFIED WITH: KOONTZ,A @ 1728 ON [id number] BY POTEAT,S     Medical History: Past Medical History  Diagnosis Date  . Hypertension   . Neuropathy   . Prostate enlargement   . DDD (degenerative disc disease), cervical   . CIDP (chronic inflammatory demyelinating polyneuropathy)   . Gait disorder   . Dysphagia   .  NSTEMI (non-ST elevated myocardial infarction)   . Enlarged prostate   . Major depressive disorder   . Constipation   . UTI (lower urinary tract infection)     Medications:  Anti-infectives    Start     Dose/Rate Route Frequency Ordered Stop   12/24/14 1200  vancomycin (VANCOCIN) 50 mg/mL oral solution 500 mg     500 mg Oral 4 times per day 12/24/14 0922 01/07/15 1159   12/23/14 1800  vancomycin (VANCOCIN) 1,250 mg in sodium chloride 0.9 % 250 mL IVPB  Status:  Discontinued     1,250 mg 166.7 mL/hr over 90 Minutes Intravenous Every 24 hours 12/21/2014 1801 12/24/14 0921   01/03/2015 2330  metroNIDAZOLE (FLAGYL) IVPB 500 mg     500 mg 100 mL/hr over 60 Minutes  Intravenous Every 8 hours 01/05/2015 2321     01/05/2015 2300  piperacillin-tazobactam (ZOSYN) IVPB 3.375 g     3.375 g 12.5 mL/hr over 240 Minutes Intravenous 3 times per day 12/31/2014 1801     12/30/2014 1815  vancomycin (VANCOCIN) IVPB 1000 mg/200 mL premix     1,000 mg 200 mL/hr over 60 Minutes Intravenous  Once 12/18/2014 1801 12/28/2014 2002   01/07/2015 1630  piperacillin-tazobactam (ZOSYN) IVPB 3.375 g     3.375 g 100 mL/hr over 30 Minutes Intravenous  Once 12/18/2014 1629 01/05/2015 1737   12/31/2014 1630  vancomycin (VANCOCIN) IVPB 1000 mg/200 mL premix     1,000 mg 200 mL/hr over 60 Minutes Intravenous  Once 12/24/2014 1629 12/31/2014 1808     Assessment: 81 y.o. male with PMH NSTEMI, HTN, CHF admitted 01/07/2015 after experiencing profound weakness and hypotension in urology clinic today.  From [facility]; facility reports recent episodes of vomiting and fever of 102.  LA elevated to 4.4 in ED.  Also with abdominal distention and tenderness.  Vanc/Zosyn given x 1 in ED; to continue per pharmacy starting 7/6  Note patient had supratherapeutic vanc trough > 30 at a previous visit with CrCl 86, but this was on 1g IV q8 hr.  7/6 >> vanc >> 7/8 7/6 >> Zosyn >> 7/6 >> Flagyl >> 7/8 >> PO Vanc >>  Positive Cdiff, other cultures ngtd, WBC elevated on steroids, Afebrile  Goal of Therapy:  Eradication of infection Appropriate antibiotic dosing for indication and renal function  Plan:  Day 3 antibiotics Cotninue Zosyn 3.375 g IV given every 8 hrs by 4-hr infusion  Follow clinical course, renal function, culture results as available  Follow for de-escalation of antibiotics and LOT   Adrian Saran, PharmD, BCPS Pager [phone] 12/24/2014 9:41 AM

## 2014-12-24 NOTE — Progress Notes (Signed)
[place] Progress Note Patient Name: Dylan Nelson DOB: [birth year] MRN: [id number]   Date of Service  12/24/2014  HPI/Events of Note  Patient confused with some mild agitation.  Has pulled out CVL and remains on pressors.  Sats are low in the high 70s and low 80s but not sure how accurate the measurements are.  RR is elevated.  QTc is elevated at 511.  eICU Interventions  Plan: Check ABG CVL to be replaced     Intervention Category Intermediate Interventions: Other:  Tong Pieczynski 12/24/2014, 9:44 PM

## 2014-12-24 NOTE — Progress Notes (Signed)
Nursing note: [phone] Pt. Progressively getting more confused throughout shift, noted that pt. pulled self far over in bed causing Central line to pull out approx 3-4 inches.  Dr. Oletta Darter notified and received order for stat [facility] which confirmed that line was placed in the mid-brachial-cephalic vein.  For now all meds to infuse via Distal port.  MD on call to be notified for possible line replacement.  Throughout shift, UOP has dropped, only 151ml's out per foley for entire 12 hr shift.  Dr. Elsworth Soho notified earlier in shift and this RN received order for  574ml bolus.  Dr. Oletta Darter has given additional order for a liter bolus and new order for Haldol prn.  This RN has called family to update on pts. decline in status.  Difficult reaching some family members by phone, did speak with Regino Schultze who is the Sister in Sports coach of patient, she shares POA with Joycelyn Schmid.  Regino Schultze is attempting to reach sister Joycelyn Schmid at this time.  Unable to reach son, messeges left on both phones.  Regino Schultze told this RN that code status has been discussed with Joycelyn Schmid and at this time he is to remain a full code. This RN assured Regino Schultze that we are providing full care and will keep them updated if changes occur.  The pts wife has Dementia and the family has asked that we DO NOT call her.  Marnee Guarneri, RN

## 2014-12-24 NOTE — Progress Notes (Signed)
[place] Progress Note Patient Name: Dylan Nelson DOB: Oct 23, 1933 MRN: [id number]   Date of Service  12/24/2014  HPI/Events of Note  Nurse requesting pain medication for patient who appears uncomfortable.  Recently made DNR.  eICU Interventions  Plan: Fentanyl 25 to 50 mcg q2 hours prn     Intervention Category Intermediate Interventions: Pain - evaluation and management  Duante Arocho 12/24/2014, 11:38 PM

## 2014-12-24 NOTE — Progress Notes (Signed)
[place] Progress Note Patient Name: Dylan Nelson DOB: 1933/10/18 MRN: [id number]   Date of Service  12/24/2014  HPI/Events of Note  Phone and Camera discussion with Ms. Merlene Pulling and Ms.  Elease Hashimoto, sister-in-law of [name] and his HCPOAs.  This was at the request of the bedside nurse who stated these relations wanted to make [name] a DNR.  Reviewed current medical condition of [name] including his ongoing need for pressor support, his worsening resp status and increased oxygen needs.  Reviewed aspects of a "code" and that given [name] ongoing poor condition that was not responding to our interventions that a DNR was appropriate.  They are in agreement but would also like for the son to be informed as well.  eICU Interventions  Plan: DNR order written Hold on CVL replacement Hold on ABG Dr. Ancil Linsey to stop by bedside when he arrives at [facility] to review patient condition with family.     Intervention Category Major Interventions: End of life / care limitation discussion  Adrian 12/24/2014, 11:03 PM

## 2014-12-25 LAB — GLUCOSE, CAPILLARY: Glucose-Capillary: 163 mg/dL — ABNORMAL HIGH (ref 65–99)

## 2014-12-25 MED ORDER — MORPHINE SULFATE 2 MG/ML IJ SOLN: 2.0000 mg | Freq: Once | INTRAMUSCULAR | Status: AC

## 2014-12-25 MED ORDER — SODIUM CHLORIDE 0.9 % IV SOLN
5.0000 mg/h | INTRAVENOUS | Status: DC
  Administered 2014-12-25: 5 mg/h via INTRAVENOUS
  Filled 2014-12-25: qty 10

## 2014-12-25 MED ORDER — MORPHINE SULFATE 2 MG/ML IJ SOLN
2.0000 mg | INTRAMUSCULAR | Status: DC | PRN
  Administered 2014-12-25 (×4): 2 mg via INTRAVENOUS
  Filled 2014-12-25: qty 1

## 2014-12-25 MED ORDER — MORPHINE SULFATE 4 MG/ML IJ SOLN
INTRAMUSCULAR | Status: AC
  Administered 2014-12-25: 2 mg
  Filled 2014-12-25: qty 2

## 2014-12-27 LAB — CULTURE, BLOOD (ROUTINE X 2)
Culture: NO GROWTH
Culture: NO GROWTH

## 2014-12-28 ENCOUNTER — Telehealth: Payer: Self-pay

## 2014-12-28 NOTE — Telephone Encounter (Signed)
On 12/28/2014 I received a death certificate from [place]. The death certificate will be for burial. The patient is a patient of Doctor Elsworth Soho. The death certificate will be taken to [facility] tomorrow am for Doctor Elsworth Soho to sign the death certificate tomorrow night. On 01-02-15 I received the death certificate back from Doctor Elsworth Soho. I got the death certificate ready for signature. I called the funeral home to let them know I put the death certificate in the mail to the [facility].

## 2015-01-11 NOTE — Discharge Summary (Signed)
PULMONARY / CRITICAL CARE MEDICINE   Name: Dylan Nelson MRN: [id number] DOB: 1933/11/15    ADMISSION DATE:  01/07/2015 CONSULTATION DATE:  01/09/2015  REFERRING MD :  EDP  CHIEF COMPLAINT:  Sepsis  INITIAL PRESENTATION: 81 year old male presented from urology office with weakness and hypotension. In ED he was found to have findings suspicious for UTI with associated hypotension and elevated lactic. BP was somewhat responsive to IVF resuscitation with MAP borderline. PCCM to see.   STUDIES:  7/6 CT abdomen > diffuse thickening of sigmoid colon, surrounding soft tissues edema, compatible with acute colitis. Trace fluid in pelvis, trace ascites. Nodular density at base of bladder, bladder wall thickening. Scattered calcification of the abdominal aorta. 7/6 CT head > No acute finding. Mild atrophy and microvascular ischemic change.   SIGNIFICANT EVENTS: 7/7 remains on neo gtt   HISTORY OF PRESENT ILLNESS:  81 year old male with PMH as below, which includes HTN, CIDP, Grade 1 DD, Recurrent UTI r/t chronic foley, R toe wound with cellulitis, osteomyelitis, and PVD. He is a resident at [facility]. His best health day leaves him confined to wheelchair with lower extremity weakness. He also has issues with dysphagia, which often prevents him from getting adequate PO intake. He was recently treated with Rocephin for UTI at the SNF per family. He has been complaining of lower abdominal pain x3 days assocaited with N/V/D and cough. 7/6 he presented to urology office to have indwelling foley exchanged. While there he was noted to have AMS with profound weakness. Bp would not register on monitors there. He was referred to ED for further eval. In  ED he was noted to to be alert and oriented, but with profound hypotension with systolic pressures in the 60's. He was also tachycardic and tachypneic. These things improved somewhat was IVF resuscitation. Initial lactic 4.5 which has since cleared to just over 2.   MAPs remianed borderline at around 65. UA is concerning for another UTI. PCCM called for ICU admission.     COURSE/ASSESSMENT / PLAN:  PULMONARY A: Acute hypoxic respiratory failure Acute Pulmonary edema  P:   Supplemental O2 as needed.   CARDIOVASCULAR A:  Septic shock -lactate cleared Atrial Fib with RVR, history of this Chronic diastolic CHF (grade 1 DD) H/o NSTEMI  P:  MAP goal > 65, SBP > 90 Aggressive IVF resuscitation per CVP  Amiodarone if needed for RVR Change from neo gtt to levo gt & add vaso   RENAL A:   Acute kidney injury  Hypokalemia  P:   Follow Bmet Correct electrolytes as indicated  GASTROINTESTINAL A:   Transaminitis, suspect due to hypotension.  C d iff Colitis  Abdominal distension  P:   NPO SUP: IV pepcid instead of protonix NGT to LIS  HEMATOLOGIC A:   No acute issues  P:  Follow CBC VTE ppx: SQ heparin  INFECTIOUS A:   Septic shock suspect from UTI, chronic foley R great toe wound with hx cellulitis, osteomyelitis. Sacral Decub C-dif colitis  P:   BCx2 7/6 >>>ng UC 7/6 >>> ng Check C-dif 7/6 >>>POS Abx: Vancomycin, start date > 7/6 >> 7/8 Abx: Zosyn, start date 7/6 > Abx: Flagyl, start date 7/6 > PO vanc 7/8 >> PCT algorithm  ENDOCRINE A:   Prednisone use, initiated 10/2014.  P:  Stress dose hydrocortisone -drop to q 12h since cortisol > 100 CBG monitoring and SSI   NEUROLOGIC A:   Acute metabolic encephalopathy CIDP, chronic inflammatory  demyelinating polyneuropathy  P:   RASS goal: 0    FAMILY  - Updates: Ms. Merlene Pulling and Ms. Elease Hashimoto, sister-in-law of Dylan Nelson and his HCPOAs.Initially  Full code - changed to DNR     Summary - C diff colitis with septic shock - doubt other sources -foley UTI vs cellulitis, Lactate cleared & no evidence of megacolon, but continued to worsen & was transitioned to comfort care.  Cause of death - Septic shock, c diff colitis, AKI    Kara Mead  MD. FCCP. [facility] Pager [phone] If no response call [phone]    01/11/2015 8:21 AM

## 2015-01-17 NOTE — Progress Notes (Signed)
Mr. Dirocco son, wife, daughter-in-law, niece and other relatives were bedside when I arrived. The family said he could still hear [place] and they wanted prayer for peace when he passes and a Scripture for comfort. After learning a little more about the family (he and wife have 16 yrs of marriage) we held hands and had prayer bedside after which we recited the 23rd Psalm. Family was very appreciative of attention and visit. Please call if additional support is needed.  Marlise Eves Holder Chaplain   January 15, 2015 0200  Clinical Encounter Type  Visited With Family;Patient and family together

## 2015-01-17 NOTE — Progress Notes (Signed)
Pt expired @ 0507. RN and family at bedside. Heart and breath sounds auscultated for 1 full minute by myself and Cecile Sheerer, RN, no heart or breath sounds heard. Family notified of pt death at bedside and emotional support given to family. All questions and concerns were addressed. Pt's wife, 2 sisters in law, son, daughter in law, and niece present at bedside when pt passed. [facility]/Dr. Deterding and [facility] notfied of pt death, pt is not a potential organ donor.   Dorrene German, RN

## 2015-01-17 NOTE — Progress Notes (Signed)
Discussed patient's progress and prognosis with family. Explained that the patient has declined over the last 12 hours, and that while survival was possible, it is becoming less likely. Furthermore, should the patient survive, he would have a long recovery and may not achieve his same quality of life. The family did not believe that the patient would wish to continue escalating care. I offered transition to comfort measures only, and the family agreed.  Luz Brazen, MD Pulmonary & Critical Care Medicine 16-Jan-2015, 1:40 AM

## 2015-01-17 NOTE — Progress Notes (Signed)
225cc Morphine gtt wasted in sink with Orlie Pollen, RN.   Dorrene German, RN

## 2015-01-17 DEATH — deceased

## 2015-02-18 ENCOUNTER — Ambulatory Visit: Payer: Medicare Other | Admitting: Vascular Surgery

## 2016-08-11 IMAGING — CT CT HEAD W/O CM
2 series · 16 of 30 positions shown, 20 images · non-contrast
Comparison: Head CT scan 01/14/2013.

CLINICAL DATA: Fatigue and altered mental status of uncertain
chronicity.

EXAM:
CT HEAD WITHOUT CONTRAST
TECHNIQUE: Contiguous axial images were obtained from the base of the skull
through the vertex without intravenous contrast.

[Series 2: head w/o · axial · non-contrast · 0.44mm/px · z∈[-162,-17]mm · 13 of 35 slices shown, 17 images]
[im 3/35  brain]
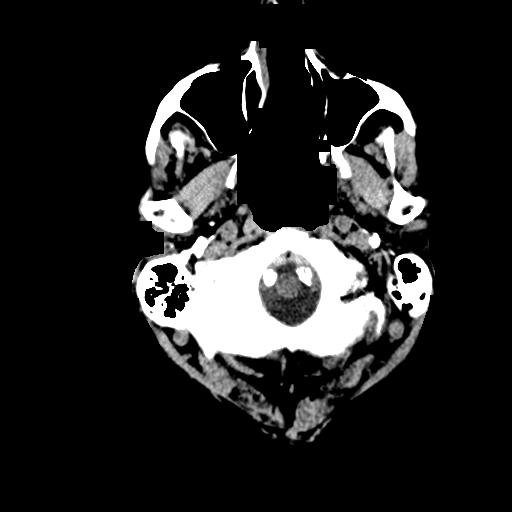
[im 3/35  bone]
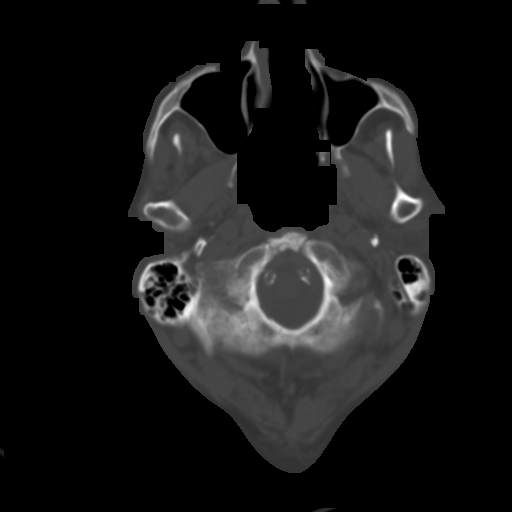
[im 5/35  brain]
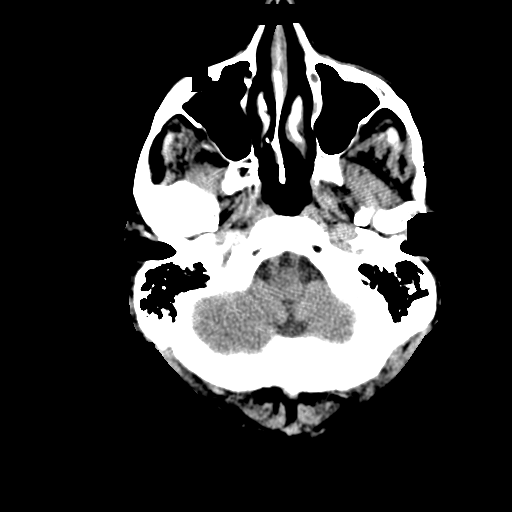
[im 8/35  brain]
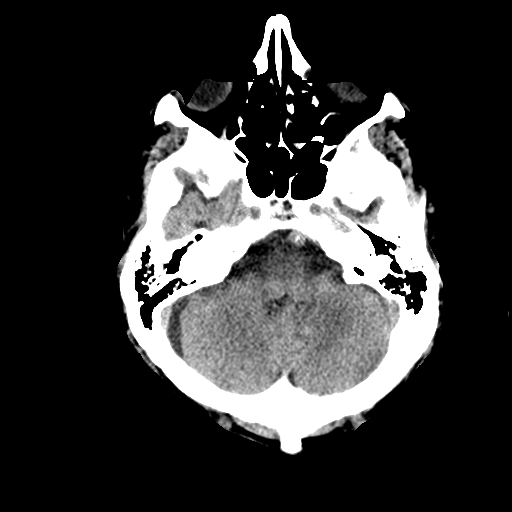
[im 10/35  brain]
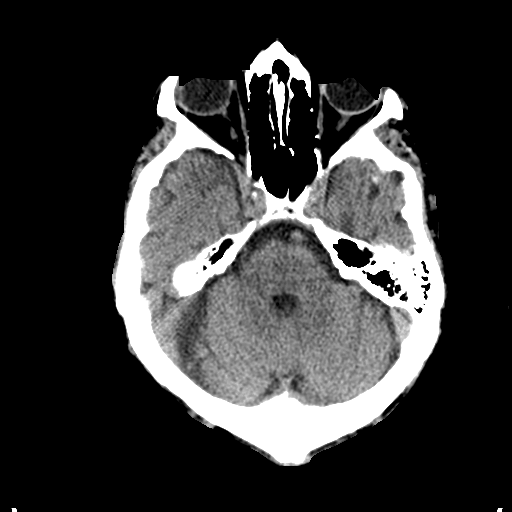
[im 13/35  brain]
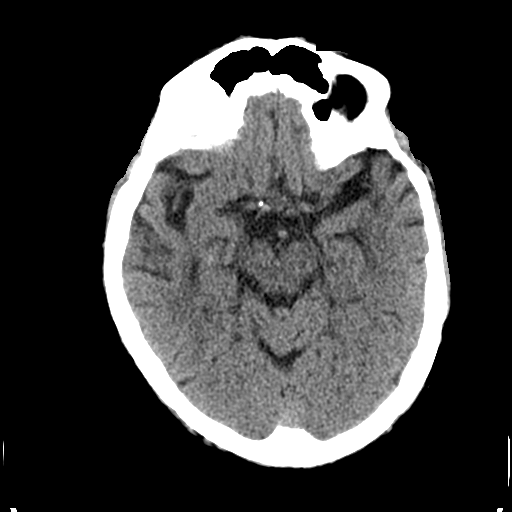
[im 13/35  bone]
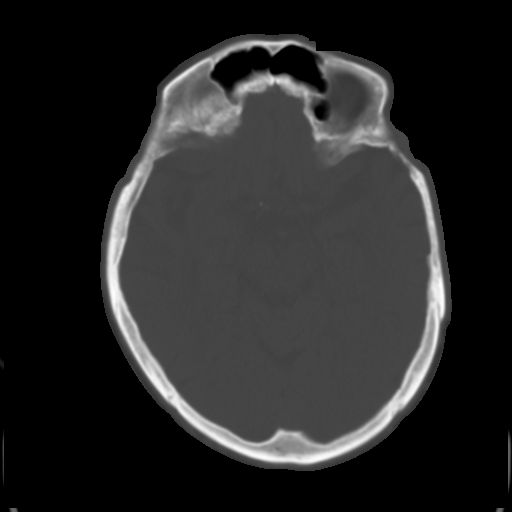
[im 15/35  brain]
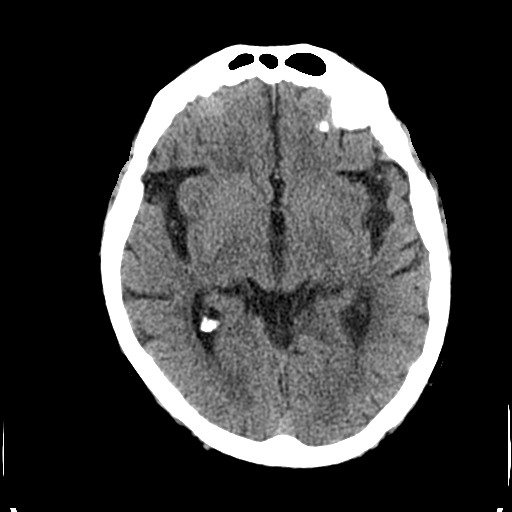
[im 18/35  brain]
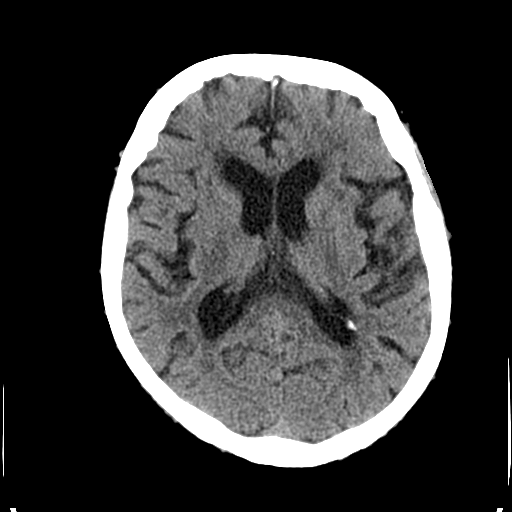
[im 20/35  brain]
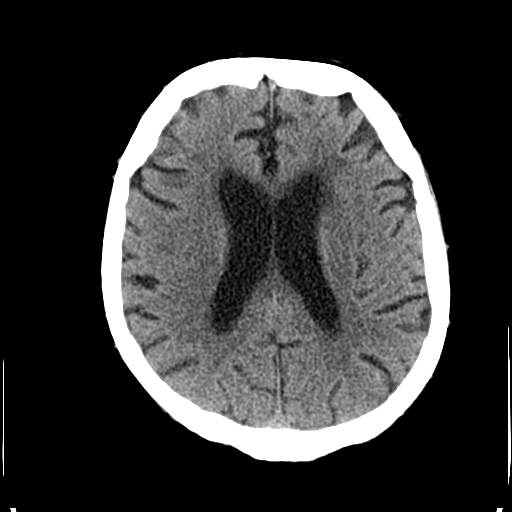
[im 22/35  brain]
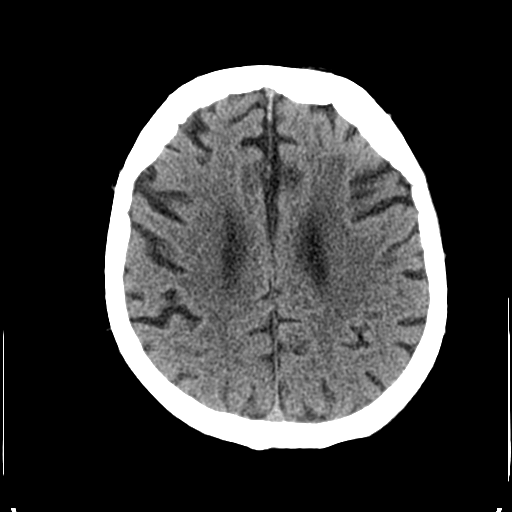
[im 22/35  bone]
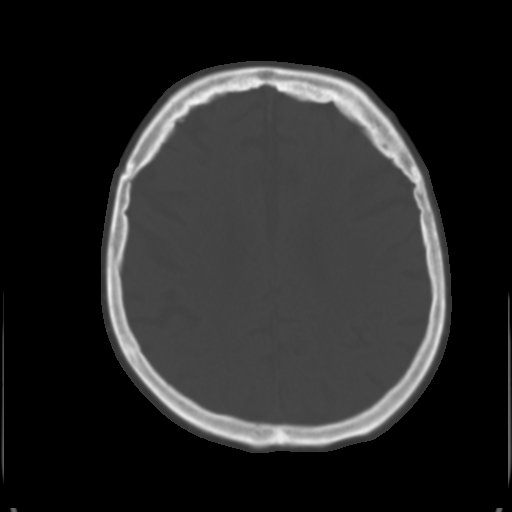
[im 25/35  brain]
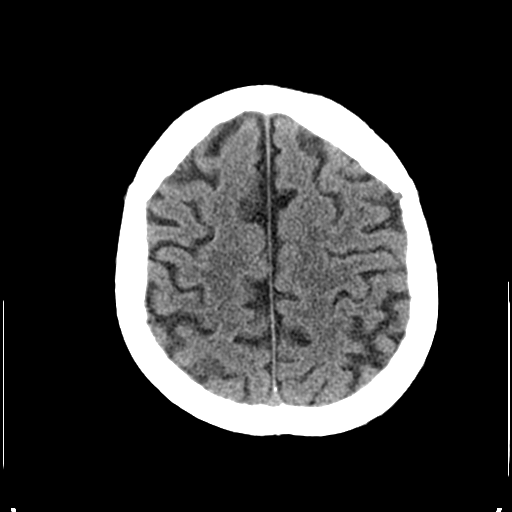
[im 27/35  brain]
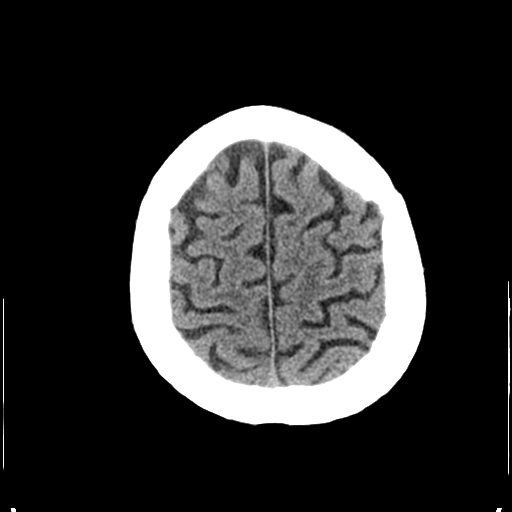
[im 30/35  brain]
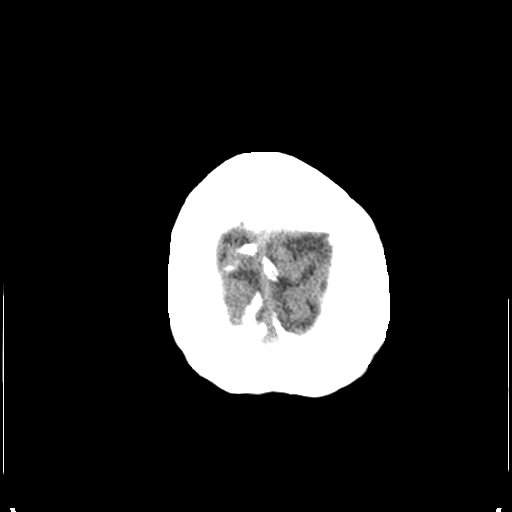
[im 32/35  brain]
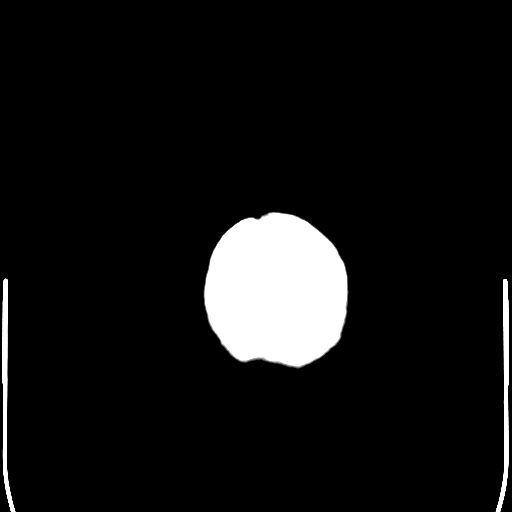
[im 32/35  bone]
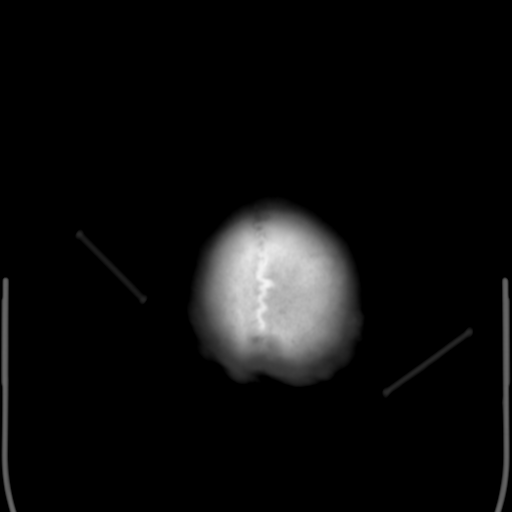

[Series 3: bone windows · axial · 0.44mm/px · z∈[-162,-112]mm · 3 of 35 slices shown]
[im 3/35  bone]
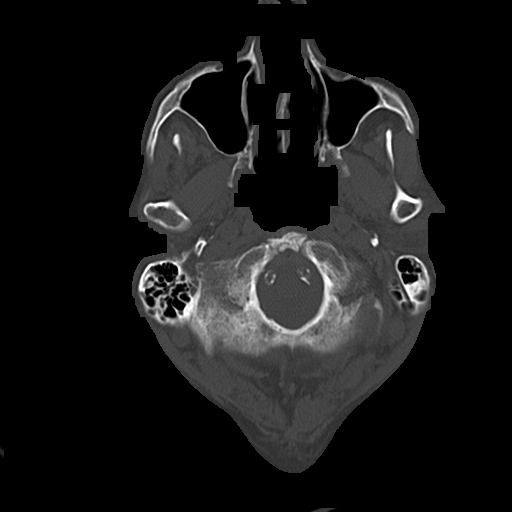
[im 8/35  bone]
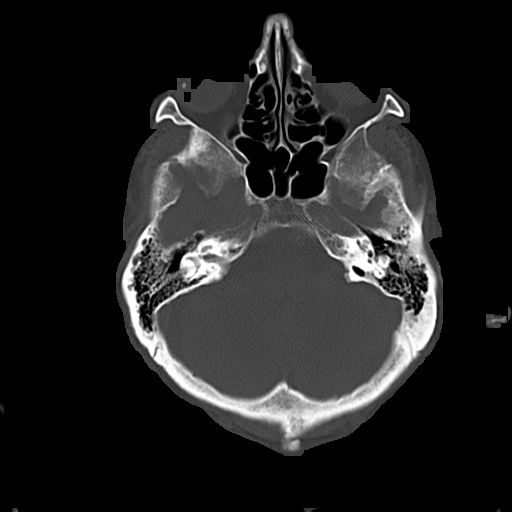
[im 13/35  bone]
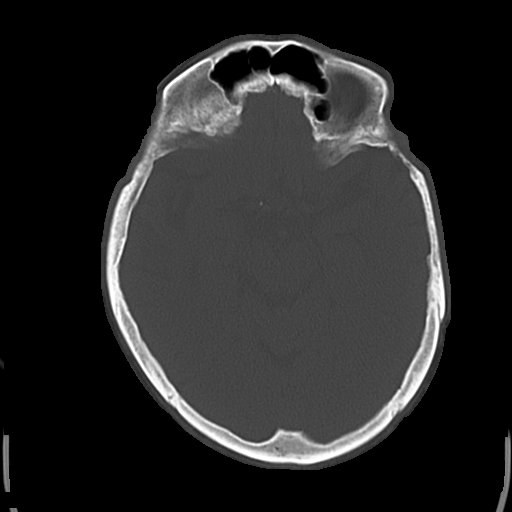

[16 of 30 positions shown; findings below may reference images not displayed]

FINDINGS: A few areas of hypoattenuation are seen in the periventricular and
subcortical deep white matter most consistent with chronic
microvascular ischemic change. The brain is mildly atrophic. No
evidence of acute intracranial abnormality including hemorrhage,
infarct, mass lesion, mass effect, midline shift or abnormal
extra-axial fluid collection is identified. No hydrocephalus or
pneumocephalus. The calvarium is intact. Imaged paranasal sinuses
and mastoid air cells are clear.
IMPRESSION: Acute finding.

Mild atrophy and chronic microvascular ischemic change.

## 2016-08-11 IMAGING — CR DG CHEST 2V
2 series · 2 of 2 positions shown · non-contrast
Comparison: Portable chest x-ray of 02/09/2014

CLINICAL DATA: Increase fatigue and disorientation

EXAM:
CHEST  2 VIEW

[w chest lat]
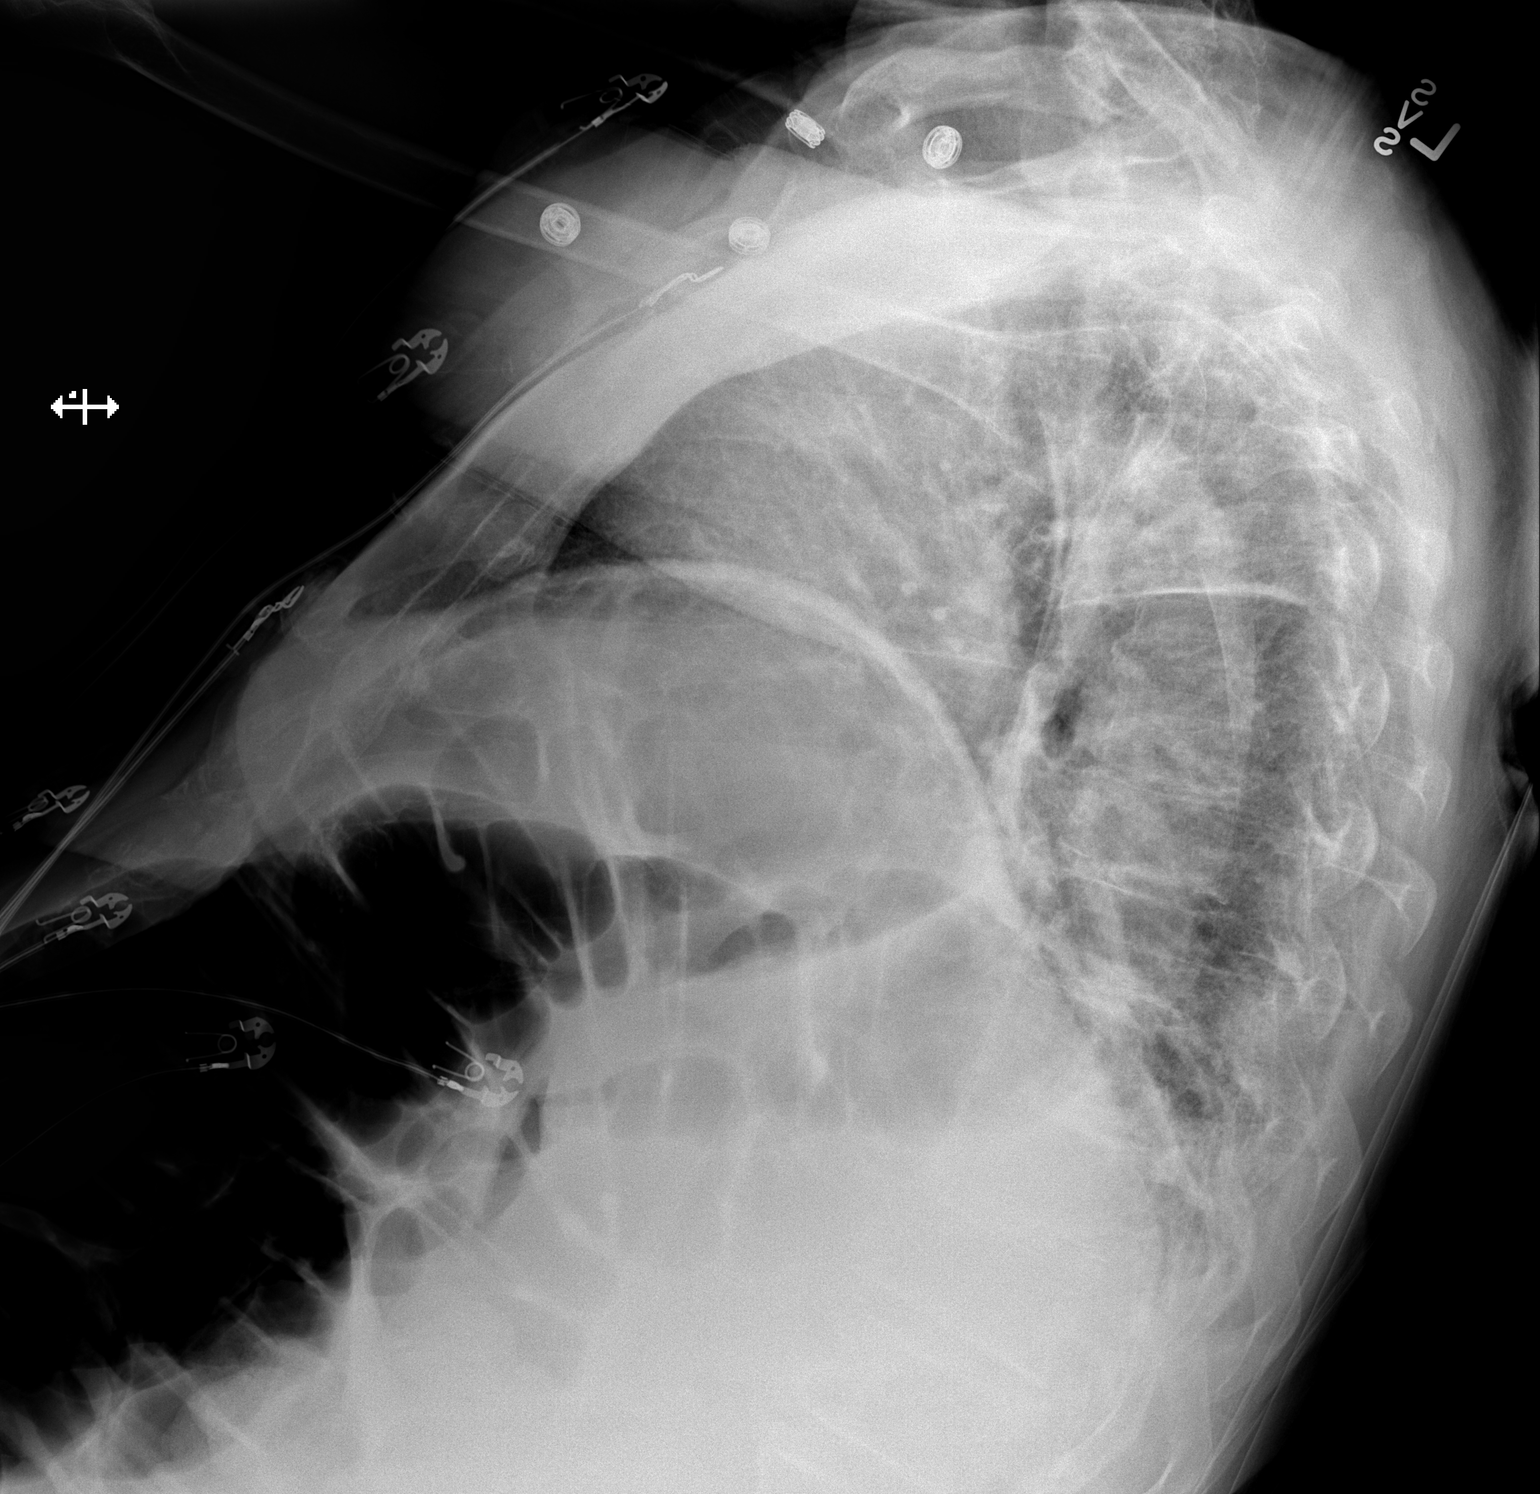

[x chest ap]
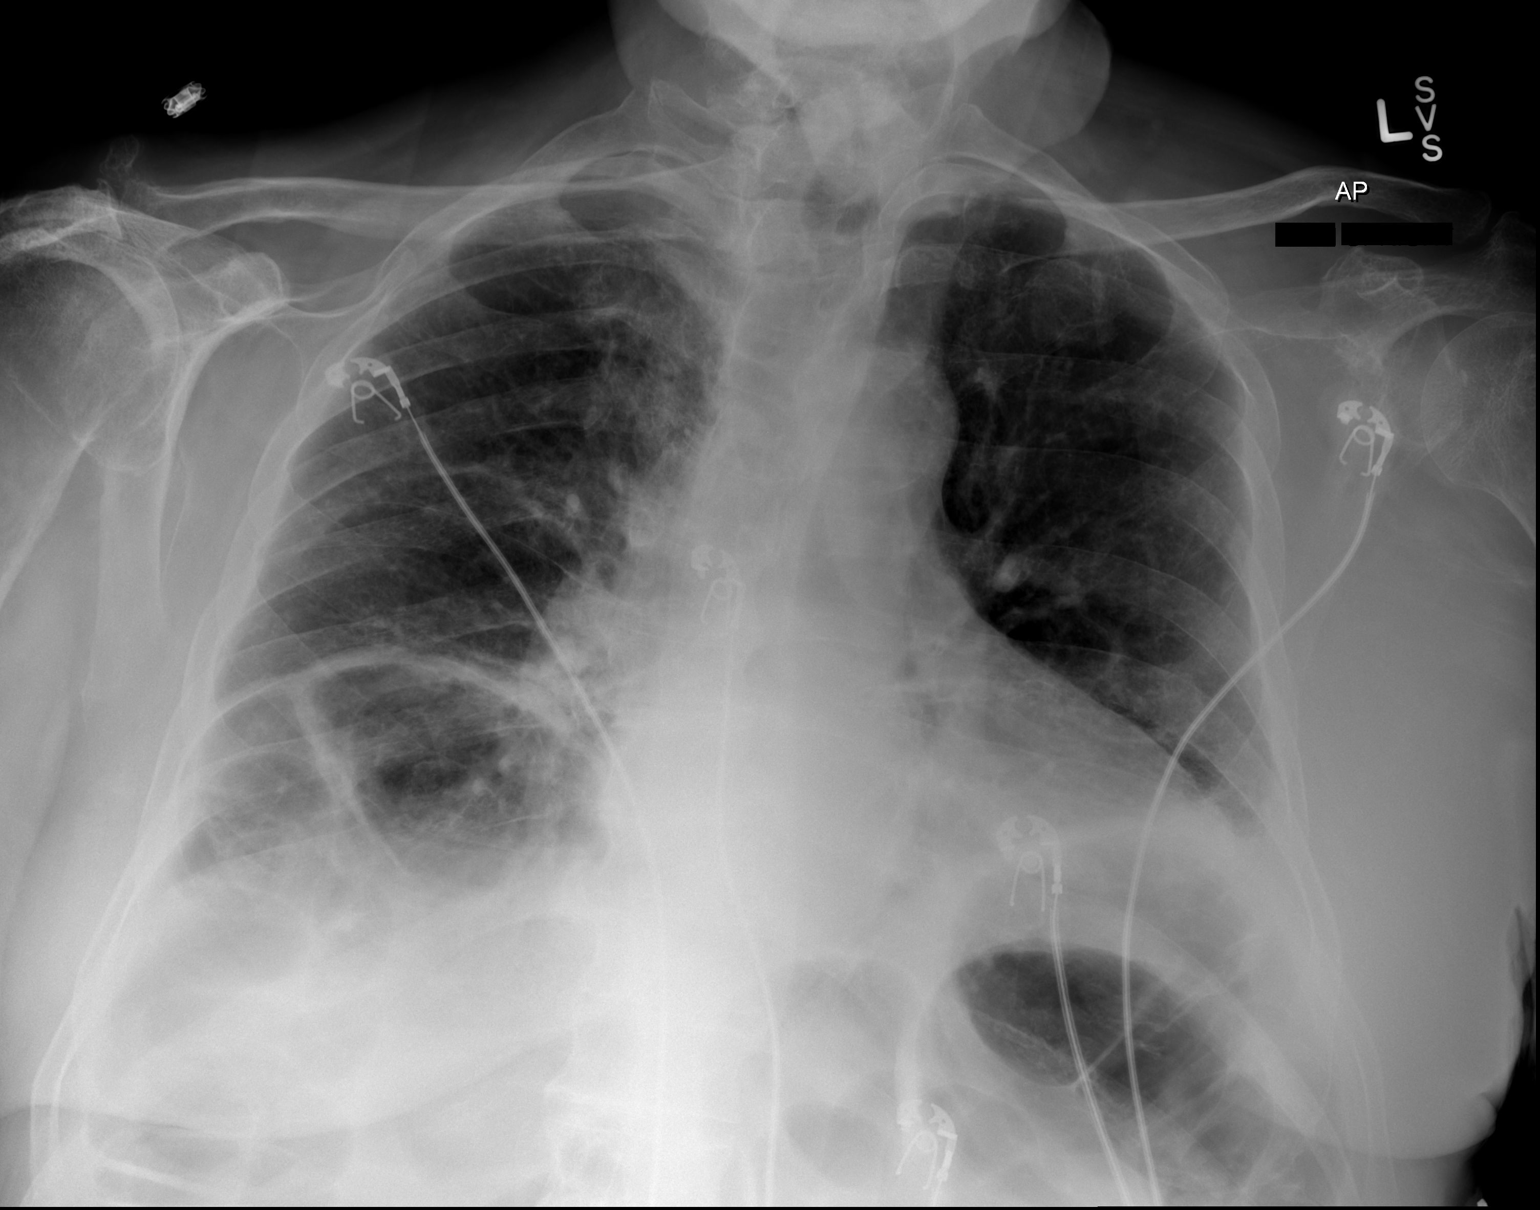

[2 of 2 positions shown; findings below may reference images not displayed]

FINDINGS: The lungs are not well aerated with mild basilar volume loss.
However no focal infiltrate or effusion is seen. Cardiomegaly is
stable. There is a large amount of bowel gas primarily within the
colon most consistent with ileus.
IMPRESSION: 1. Poor inspiration with bibasilar atelectasis.
2. Gaseous distention of the colon.  Probable ileus.
